# Patient Record
Sex: Male | Born: 1940 | Race: White | Hispanic: No | Marital: Married | Smoking: Former smoker
Health system: Southern US, Community
[De-identification: ages and names within clinical notes are randomized; demographics above are authoritative.]

## PROBLEM LIST (undated history)

## (undated) DIAGNOSIS — I10 Essential (primary) hypertension: Secondary | ICD-10-CM

## (undated) DIAGNOSIS — E669 Obesity, unspecified: Secondary | ICD-10-CM

## (undated) DIAGNOSIS — E785 Hyperlipidemia, unspecified: Secondary | ICD-10-CM

## (undated) DIAGNOSIS — E1140 Type 2 diabetes mellitus with diabetic neuropathy, unspecified: Secondary | ICD-10-CM

## (undated) DIAGNOSIS — Z889 Allergy status to unspecified drugs, medicaments and biological substances status: Secondary | ICD-10-CM

## (undated) DIAGNOSIS — I519 Heart disease, unspecified: Secondary | ICD-10-CM

## (undated) DIAGNOSIS — I739 Peripheral vascular disease, unspecified: Secondary | ICD-10-CM

## (undated) DIAGNOSIS — I482 Chronic atrial fibrillation, unspecified: Secondary | ICD-10-CM

## (undated) DIAGNOSIS — I5032 Chronic diastolic (congestive) heart failure: Secondary | ICD-10-CM

## (undated) DIAGNOSIS — I2510 Atherosclerotic heart disease of native coronary artery without angina pectoris: Secondary | ICD-10-CM

## (undated) DIAGNOSIS — IMO0002 Reserved for concepts with insufficient information to code with codable children: Secondary | ICD-10-CM

## (undated) DIAGNOSIS — J439 Emphysema, unspecified: Secondary | ICD-10-CM

## (undated) HISTORY — PX: CATARACT EXTRACTION: SUR2

## (undated) HISTORY — DX: Type 2 diabetes mellitus with diabetic neuropathy, unspecified: E11.40

## (undated) HISTORY — DX: Peripheral vascular disease, unspecified: I73.9

## (undated) HISTORY — DX: Chronic atrial fibrillation, unspecified: I48.20

## (undated) HISTORY — DX: Reserved for concepts with insufficient information to code with codable children: IMO0002

## (undated) HISTORY — DX: Hyperlipidemia, unspecified: E78.5

## (undated) HISTORY — PX: ROTATOR CUFF REPAIR: SHX139

## (undated) HISTORY — DX: Emphysema, unspecified: J43.9

## (undated) HISTORY — DX: Obesity, unspecified: E66.9

## (undated) HISTORY — PX: KNEE SURGERY: SHX244

## (undated) HISTORY — DX: Atherosclerotic heart disease of native coronary artery without angina pectoris: I25.10

## (undated) HISTORY — DX: Heart disease, unspecified: I51.9

## (undated) HISTORY — DX: Allergy status to unspecified drugs, medicaments and biological substances: Z88.9

## (undated) HISTORY — DX: Essential (primary) hypertension: I10

## (undated) HISTORY — DX: Chronic diastolic (congestive) heart failure: I50.32

---

## 2002-01-06 HISTORY — PX: CORONARY STENT PLACEMENT: SHX1402

## 2002-12-02 ENCOUNTER — Inpatient Hospital Stay (HOSPITAL_COMMUNITY): Admission: EM | Admit: 2002-12-02 | Discharge: 2002-12-04 | Payer: Self-pay | Admitting: Cardiology

## 2005-05-22 ENCOUNTER — Ambulatory Visit: Payer: Self-pay | Admitting: Cardiology

## 2005-05-26 ENCOUNTER — Ambulatory Visit: Payer: Self-pay | Admitting: Cardiology

## 2005-05-29 ENCOUNTER — Ambulatory Visit: Payer: Self-pay | Admitting: Cardiology

## 2005-06-06 ENCOUNTER — Ambulatory Visit: Payer: Self-pay | Admitting: Cardiology

## 2005-06-13 ENCOUNTER — Ambulatory Visit: Payer: Self-pay | Admitting: Cardiology

## 2005-06-20 ENCOUNTER — Ambulatory Visit: Payer: Self-pay | Admitting: Cardiology

## 2005-06-27 ENCOUNTER — Ambulatory Visit: Payer: Self-pay | Admitting: Cardiology

## 2005-07-25 ENCOUNTER — Ambulatory Visit: Payer: Self-pay | Admitting: Cardiology

## 2005-08-22 ENCOUNTER — Ambulatory Visit: Payer: Self-pay | Admitting: Cardiology

## 2005-09-11 ENCOUNTER — Ambulatory Visit: Payer: Self-pay | Admitting: Cardiology

## 2005-09-19 ENCOUNTER — Ambulatory Visit: Payer: Self-pay | Admitting: Cardiology

## 2005-09-25 ENCOUNTER — Ambulatory Visit: Payer: Self-pay | Admitting: Cardiology

## 2005-09-30 ENCOUNTER — Ambulatory Visit: Payer: Self-pay | Admitting: Cardiology

## 2005-10-10 ENCOUNTER — Ambulatory Visit: Payer: Self-pay | Admitting: Cardiology

## 2005-10-17 ENCOUNTER — Ambulatory Visit: Payer: Self-pay | Admitting: Cardiology

## 2005-11-14 ENCOUNTER — Ambulatory Visit: Payer: Self-pay | Admitting: Cardiology

## 2005-12-12 ENCOUNTER — Ambulatory Visit: Payer: Self-pay | Admitting: Cardiology

## 2006-01-09 ENCOUNTER — Ambulatory Visit: Payer: Self-pay | Admitting: Cardiology

## 2006-02-13 ENCOUNTER — Ambulatory Visit: Payer: Self-pay | Admitting: Cardiology

## 2006-02-27 ENCOUNTER — Ambulatory Visit: Payer: Self-pay | Admitting: Cardiology

## 2006-03-13 ENCOUNTER — Ambulatory Visit: Payer: Self-pay | Admitting: Cardiology

## 2006-04-10 ENCOUNTER — Ambulatory Visit: Payer: Self-pay | Admitting: Cardiology

## 2006-05-08 ENCOUNTER — Ambulatory Visit: Payer: Self-pay | Admitting: Cardiology

## 2006-05-25 ENCOUNTER — Ambulatory Visit: Payer: Self-pay | Admitting: Cardiology

## 2006-06-05 ENCOUNTER — Ambulatory Visit: Payer: Self-pay | Admitting: Cardiology

## 2006-06-18 ENCOUNTER — Ambulatory Visit: Payer: Self-pay | Admitting: Cardiology

## 2006-06-26 ENCOUNTER — Ambulatory Visit: Payer: Self-pay | Admitting: Cardiology

## 2006-07-03 ENCOUNTER — Ambulatory Visit: Payer: Self-pay | Admitting: Cardiology

## 2006-07-15 ENCOUNTER — Ambulatory Visit: Payer: Self-pay | Admitting: Critical Care Medicine

## 2006-07-21 ENCOUNTER — Ambulatory Visit: Payer: Self-pay | Admitting: Cardiology

## 2006-07-24 ENCOUNTER — Ambulatory Visit: Payer: Self-pay | Admitting: Cardiology

## 2006-07-28 ENCOUNTER — Ambulatory Visit: Payer: Self-pay | Admitting: Cardiology

## 2006-08-06 ENCOUNTER — Ambulatory Visit: Payer: Self-pay | Admitting: Physician Assistant

## 2006-08-24 ENCOUNTER — Ambulatory Visit: Payer: Self-pay | Admitting: Critical Care Medicine

## 2006-08-28 ENCOUNTER — Ambulatory Visit: Payer: Self-pay | Admitting: Cardiology

## 2006-09-18 ENCOUNTER — Ambulatory Visit: Payer: Self-pay | Admitting: Cardiology

## 2006-09-25 ENCOUNTER — Ambulatory Visit: Payer: Self-pay | Admitting: Cardiology

## 2006-10-09 ENCOUNTER — Ambulatory Visit: Payer: Self-pay | Admitting: Cardiology

## 2006-10-30 ENCOUNTER — Ambulatory Visit: Payer: Self-pay | Admitting: Cardiology

## 2006-11-27 ENCOUNTER — Ambulatory Visit: Payer: Self-pay | Admitting: Cardiology

## 2006-12-07 ENCOUNTER — Ambulatory Visit: Payer: Self-pay | Admitting: Critical Care Medicine

## 2007-01-04 ENCOUNTER — Ambulatory Visit: Payer: Self-pay | Admitting: Cardiology

## 2007-02-05 ENCOUNTER — Ambulatory Visit: Payer: Self-pay | Admitting: Cardiology

## 2007-03-05 ENCOUNTER — Ambulatory Visit: Payer: Self-pay | Admitting: Cardiology

## 2007-04-02 ENCOUNTER — Ambulatory Visit: Payer: Self-pay | Admitting: Cardiology

## 2007-04-30 ENCOUNTER — Ambulatory Visit: Payer: Self-pay | Admitting: Cardiology

## 2007-05-11 ENCOUNTER — Ambulatory Visit: Payer: Self-pay | Admitting: Cardiology

## 2007-05-28 ENCOUNTER — Ambulatory Visit: Payer: Self-pay | Admitting: Cardiology

## 2007-06-25 ENCOUNTER — Ambulatory Visit: Payer: Self-pay | Admitting: Cardiology

## 2007-07-06 ENCOUNTER — Ambulatory Visit: Payer: Self-pay | Admitting: Critical Care Medicine

## 2007-07-30 ENCOUNTER — Ambulatory Visit: Payer: Self-pay | Admitting: Cardiology

## 2007-09-03 ENCOUNTER — Ambulatory Visit: Payer: Self-pay | Admitting: Cardiology

## 2007-10-01 ENCOUNTER — Ambulatory Visit: Payer: Self-pay | Admitting: Cardiology

## 2007-10-28 ENCOUNTER — Ambulatory Visit: Payer: Self-pay | Admitting: Critical Care Medicine

## 2007-10-29 ENCOUNTER — Ambulatory Visit: Payer: Self-pay | Admitting: Cardiology

## 2007-11-01 ENCOUNTER — Encounter: Payer: Self-pay | Admitting: Critical Care Medicine

## 2007-11-08 ENCOUNTER — Telehealth (INDEPENDENT_AMBULATORY_CARE_PROVIDER_SITE_OTHER): Payer: Self-pay | Admitting: *Deleted

## 2007-11-26 ENCOUNTER — Ambulatory Visit: Payer: Self-pay | Admitting: Cardiology

## 2007-12-20 ENCOUNTER — Ambulatory Visit: Payer: Self-pay | Admitting: Critical Care Medicine

## 2007-12-24 ENCOUNTER — Ambulatory Visit: Payer: Self-pay | Admitting: Cardiology

## 2008-01-21 ENCOUNTER — Ambulatory Visit: Payer: Self-pay | Admitting: Cardiology

## 2008-02-01 ENCOUNTER — Ambulatory Visit: Payer: Self-pay | Admitting: Cardiology

## 2008-02-11 ENCOUNTER — Encounter: Payer: Self-pay | Admitting: Cardiology

## 2008-02-18 ENCOUNTER — Ambulatory Visit: Payer: Self-pay | Admitting: Cardiology

## 2008-02-25 ENCOUNTER — Ambulatory Visit: Payer: Self-pay | Admitting: Cardiology

## 2008-02-29 ENCOUNTER — Ambulatory Visit: Payer: Self-pay | Admitting: Cardiology

## 2008-03-10 ENCOUNTER — Ambulatory Visit: Payer: Self-pay | Admitting: Cardiology

## 2008-03-31 ENCOUNTER — Ambulatory Visit: Payer: Self-pay | Admitting: Cardiology

## 2008-04-28 ENCOUNTER — Ambulatory Visit: Payer: Self-pay | Admitting: Cardiology

## 2008-05-23 ENCOUNTER — Ambulatory Visit: Payer: Self-pay | Admitting: Cardiology

## 2008-06-08 ENCOUNTER — Ambulatory Visit: Payer: Self-pay | Admitting: Cardiology

## 2008-06-20 ENCOUNTER — Ambulatory Visit: Payer: Self-pay | Admitting: Cardiology

## 2008-07-07 ENCOUNTER — Ambulatory Visit: Payer: Self-pay | Admitting: Cardiology

## 2008-07-14 ENCOUNTER — Ambulatory Visit: Payer: Self-pay | Admitting: Cardiology

## 2008-08-11 ENCOUNTER — Ambulatory Visit: Payer: Self-pay | Admitting: Cardiology

## 2008-08-21 ENCOUNTER — Encounter: Payer: Self-pay | Admitting: *Deleted

## 2008-08-24 ENCOUNTER — Ambulatory Visit: Payer: Self-pay | Admitting: Critical Care Medicine

## 2008-09-05 ENCOUNTER — Ambulatory Visit: Payer: Self-pay | Admitting: Cardiology

## 2008-09-11 ENCOUNTER — Telehealth: Payer: Self-pay | Admitting: Emergency Medicine

## 2008-09-13 ENCOUNTER — Ambulatory Visit: Payer: Self-pay | Admitting: Critical Care Medicine

## 2008-09-20 ENCOUNTER — Ambulatory Visit: Payer: Self-pay | Admitting: Critical Care Medicine

## 2008-09-26 ENCOUNTER — Ambulatory Visit: Payer: Self-pay | Admitting: Cardiology

## 2008-10-17 ENCOUNTER — Ambulatory Visit: Payer: Self-pay | Admitting: Cardiology

## 2008-10-17 LAB — CONVERTED CEMR LAB: POC INR: 2.5

## 2008-11-13 ENCOUNTER — Encounter: Payer: Self-pay | Admitting: Physician Assistant

## 2008-11-13 ENCOUNTER — Ambulatory Visit: Payer: Self-pay | Admitting: Critical Care Medicine

## 2008-11-13 ENCOUNTER — Encounter: Payer: Self-pay | Admitting: Cardiology

## 2008-11-13 ENCOUNTER — Ambulatory Visit: Payer: Self-pay | Admitting: Cardiology

## 2008-11-13 LAB — CONVERTED CEMR LAB: POC INR: 2.4

## 2008-11-14 ENCOUNTER — Encounter: Payer: Self-pay | Admitting: Cardiology

## 2008-11-17 ENCOUNTER — Encounter: Payer: Self-pay | Admitting: Critical Care Medicine

## 2008-11-20 ENCOUNTER — Encounter: Payer: Self-pay | Admitting: Physician Assistant

## 2008-12-01 ENCOUNTER — Encounter: Payer: Self-pay | Admitting: Critical Care Medicine

## 2008-12-07 ENCOUNTER — Ambulatory Visit: Payer: Self-pay | Admitting: Cardiology

## 2008-12-08 ENCOUNTER — Encounter: Payer: Self-pay | Admitting: Cardiology

## 2008-12-13 ENCOUNTER — Ambulatory Visit: Payer: Self-pay | Admitting: Critical Care Medicine

## 2009-01-02 ENCOUNTER — Ambulatory Visit: Payer: Self-pay | Admitting: Cardiology

## 2009-01-02 LAB — CONVERTED CEMR LAB: POC INR: 2.1

## 2009-01-30 ENCOUNTER — Ambulatory Visit: Payer: Self-pay | Admitting: Cardiology

## 2009-01-30 LAB — CONVERTED CEMR LAB: POC INR: 2.2

## 2009-02-08 ENCOUNTER — Ambulatory Visit: Payer: Self-pay | Admitting: Cardiology

## 2009-02-27 ENCOUNTER — Ambulatory Visit: Payer: Self-pay | Admitting: Cardiology

## 2009-02-27 LAB — CONVERTED CEMR LAB: POC INR: 2.2

## 2009-03-14 ENCOUNTER — Ambulatory Visit: Payer: Self-pay | Admitting: Critical Care Medicine

## 2009-03-27 ENCOUNTER — Ambulatory Visit: Payer: Self-pay | Admitting: Cardiology

## 2009-04-24 ENCOUNTER — Ambulatory Visit: Payer: Self-pay | Admitting: Cardiology

## 2009-05-25 ENCOUNTER — Ambulatory Visit: Payer: Self-pay | Admitting: Cardiology

## 2009-05-25 LAB — CONVERTED CEMR LAB: POC INR: 2.1

## 2009-05-28 ENCOUNTER — Encounter: Payer: Self-pay | Admitting: Cardiology

## 2009-06-22 ENCOUNTER — Ambulatory Visit: Payer: Self-pay | Admitting: Cardiology

## 2009-06-27 ENCOUNTER — Encounter: Payer: Self-pay | Admitting: Cardiology

## 2009-07-18 ENCOUNTER — Ambulatory Visit: Payer: Self-pay | Admitting: Critical Care Medicine

## 2009-07-25 ENCOUNTER — Encounter: Payer: Self-pay | Admitting: Cardiology

## 2009-07-27 ENCOUNTER — Ambulatory Visit: Payer: Self-pay | Admitting: Cardiology

## 2009-08-24 ENCOUNTER — Ambulatory Visit: Payer: Self-pay | Admitting: Cardiology

## 2009-08-24 LAB — CONVERTED CEMR LAB: POC INR: 2.4

## 2009-09-04 ENCOUNTER — Encounter: Payer: Self-pay | Admitting: Cardiology

## 2009-09-07 ENCOUNTER — Ambulatory Visit: Payer: Self-pay | Admitting: Critical Care Medicine

## 2009-09-13 ENCOUNTER — Ambulatory Visit: Payer: Self-pay | Admitting: Cardiology

## 2009-09-18 ENCOUNTER — Encounter: Payer: Self-pay | Admitting: Critical Care Medicine

## 2009-09-21 ENCOUNTER — Ambulatory Visit: Payer: Self-pay | Admitting: Cardiology

## 2009-09-21 LAB — CONVERTED CEMR LAB: POC INR: 2

## 2009-10-15 ENCOUNTER — Telehealth (INDEPENDENT_AMBULATORY_CARE_PROVIDER_SITE_OTHER): Payer: Self-pay | Admitting: *Deleted

## 2009-10-19 ENCOUNTER — Ambulatory Visit: Payer: Self-pay | Admitting: Cardiology

## 2009-10-19 LAB — CONVERTED CEMR LAB: POC INR: 2.1

## 2009-11-15 ENCOUNTER — Encounter: Payer: Self-pay | Admitting: Critical Care Medicine

## 2009-11-16 ENCOUNTER — Ambulatory Visit: Payer: Self-pay | Admitting: Cardiology

## 2009-11-20 ENCOUNTER — Ambulatory Visit: Payer: Self-pay | Admitting: Critical Care Medicine

## 2009-11-21 ENCOUNTER — Encounter: Payer: Self-pay | Admitting: Critical Care Medicine

## 2009-12-14 ENCOUNTER — Ambulatory Visit: Payer: Self-pay | Admitting: Cardiology

## 2009-12-14 LAB — CONVERTED CEMR LAB: POC INR: 2.3

## 2010-01-02 ENCOUNTER — Ambulatory Visit: Payer: Self-pay | Admitting: Critical Care Medicine

## 2010-01-11 ENCOUNTER — Ambulatory Visit: Admission: RE | Admit: 2010-01-11 | Discharge: 2010-01-11 | Payer: Self-pay | Source: Home / Self Care

## 2010-01-11 LAB — CONVERTED CEMR LAB: POC INR: 2

## 2010-02-03 LAB — CONVERTED CEMR LAB: POC INR: 2.5

## 2010-02-05 NOTE — Miscellaneous (Signed)
  Clinical Lists Changes  Observations: Added new observation of PAST MED HX:  dyspnea..chronic... Dr. Danise Mina....COPD and primary emphysema. / O2 at nighttime started November, 2010 Atrial fibrillation...permanent Coumadin therapy. EF 60%... echo... June, 2010.... technically limited... mild LVH.. Right ventricle.... mild moderately dilated... echo... June, 2010.... mild RV dysfunction... RV pressure could not be estimated with no TR CAD.Marland KitchenMarland KitchenMI with a Taxus stent in 2004..  /   nuclear... June, 2010.. small scar or slight ischemia inferior wall Diabetes treated. Hyperlipidemia  Obesity.   peripheral edema..mild Hypertension (02/08/2009 7:46) Added new observation of REFERRING MD: Delford Field (02/08/2009 7:46) Added new observation of PRIMARY MD: Arlina Robes, PA @ [facility] (02/08/2009 7:46)       Past History:  Past Medical History:  dyspnea..chronic... Dr. Danise Mina....COPD and primary emphysema. / O2 at nighttime started November, 2010 Atrial fibrillation...permanent Coumadin therapy. EF 60%... echo... June, 2010.... technically limited... mild LVH.. Right ventricle.... mild moderately dilated... echo... June, 2010.... mild RV dysfunction... RV pressure could not be estimated with no TR CAD.Marland KitchenMarland KitchenMI with a Taxus stent in 2004..  /   nuclear... June, 2010.. small scar or slight ischemia inferior wall Diabetes treated. Hyperlipidemia  Obesity.   peripheral edema..mild Hypertension

## 2010-02-05 NOTE — Miscellaneous (Signed)
Summary: Rehab Report/ CARDIAC REHAB PROGRESS REPORT  Rehab Report/ CARDIAC REHAB PROGRESS REPORT   Imported By: Dorise Hiss 06/27/2009 14:36:59  _____________________________________________________________________  External Attachment:    Type:   Image     Comment:   External Document

## 2010-02-05 NOTE — Medication Information (Signed)
Summary: ccr-lr  Anticoagulant Therapy  Managed by: Vashti Hey, RN PCP: Dr. Quintin Alto Supervising MD: Andee Lineman MD, Michelle Piper Indication 1: Atrial Fibrillation (ICD-427.31) [name] [place] Site: [place] INR POC 2.2  Dietary changes: no    Health status changes: yes       Details: In grown toenail  Bleeding/hemorrhagic complications: no    Recent/future hospitalizations: no    Any changes in medication regimen? yes       Details: On Augmentin x 10 days   Finished this morning  Recent/future dental: no  Any missed doses?: no       Is patient compliant with meds? yes       Allergies: 1)  ! Plavix (Clopidogrel Bisulfate)  Anticoagulation Management History:      Positive risk factors for bleeding include an age of 72 years or older.  The bleeding index is 'intermediate risk'.  Positive CHADS2 values include History of CHF and History of HTN.  Negative CHADS2 values include Age > 52 years old.  The start date was 05/26/2005.  Anticoagulation responsible provider: Andee Lineman MD, Michelle Piper.  INR POC: 2.2.  Exp: 10/11.    Anticoagulation Management Assessment/Plan:      The patient's current anticoagulation dose is Warfarin sodium 5 mg tabs: take as directed per coumadin clinic.  The target INR is 2 - 3.  The next INR is due 12/14/2009.  Anticoagulation instructions were given to patient.  Results were reviewed/authorized by Vashti Hey, RN.  He was notified by Vashti Hey RN.         Prior Anticoagulation Instructions: INR 2.1 Continue coumadin 5mg  once daily   Current Anticoagulation Instructions: INR 2.2 Continue coumadin 5mg  once daily

## 2010-02-05 NOTE — Medication Information (Signed)
Summary: ccr-lr  Anticoagulant Therapy  Managed by: Vashti Hey, RN PCP: Dr. Quintin Alto Supervising MD: Antoine Poche MD, Fayrene Fearing Indication 1: Atrial Fibrillation (ICD-427.31) Lab [name] [place] Site: [place] INR POC 2.0  Dietary changes: no    Health status changes: no    Bleeding/hemorrhagic complications: no    Recent/future hospitalizations: no    Any changes in medication regimen? no    Recent/future dental: no  Any missed doses?: no       Is patient compliant with meds? yes       Allergies: 1)  ! Plavix (Clopidogrel Bisulfate)  Anticoagulation Management History:      The patient is taking warfarin and comes in today for a routine follow up visit.  Positive risk factors for bleeding include an age of 72 years or older.  The bleeding index is 'intermediate risk'.  Positive CHADS2 values include History of CHF and History of HTN.  Negative CHADS2 values include Age > 40 years old.  The start date was 05/26/2005.  Anticoagulation responsible provider: Antoine Poche MD, Fayrene Fearing.  INR POC: 2.0.  Cuvette Lot#: [id number].  Exp: 10/11.    Anticoagulation Management Assessment/Plan:      The patient's current anticoagulation dose is Warfarin sodium 5 mg tabs: take as directed per coumadin clinic.  The target INR is 2 - 3.  The next INR is due 05/25/2009.  Anticoagulation instructions were given to patient.  Results were reviewed/authorized by Vashti Hey, RN.  He was notified by Vashti Hey RN.         Prior Anticoagulation Instructions: INR 2.1 Continue coumadin 5mg  once daily except 2.5mg  on Mondays  Current Anticoagulation Instructions: INR 2.0 Increase coumadin to 5mg  once daily

## 2010-02-05 NOTE — Assessment & Plan Note (Signed)
Summary: 2 MO FU -SRS***[name] ONLY***   Visit Type:  Follow-up Referring Provider:  Delford Field Primary Provider:  Dr. Quintin Alto  CC:  atrial fibrillation.  History of Present Illness: Patient is seen for followup of atrial fibrillation.  He has significant COPD and this followed by pulmonary for this.  He does have coronary disease.  We have assessed him carefully.  I feel that his current shortness of breath is mostly pulmonary.  In general he's doing well under the care of Dr.Wright. He does have some peripheral edema.  He is on diuretics.  I talked to him today about elevating his feet when he is at home resting.  Preventive Screening-Counseling & Management  Alcohol-Tobacco     Smoking Status: quit > 6 months  Comments: Pt smoked for about 40 yrs and quit about 10 yrs ago  Current Medications (verified): 1)  Adult Aspirin Low Strength 81 Mg  Tbdp (Aspirin) .... One By Mouth Once Daily 2)  Lovastatin 40 Mg  Tabs (Lovastatin) .... One By Mouth Once Daily 3)  Metoprolol Succinate 50 Mg  Tb24 (Metoprolol Succinate) .... One By Mouth Two Times A Day 4)  Warfarin Sodium 5 Mg Tabs (Warfarin Sodium) .... Take As Directed Per Coumadin Clinic 5)  Metformin Hcl 500 Mg  Tb24 (Metformin Hcl) .... Take 1 Tablet By Mouth Once A Day 6)  Klor-Con M20 20 Meq  Tbcr (Potassium Chloride Crys Cr) .... Take One Tablet By Mouth Daily 7)  Furosemide 80 Mg Tabs (Furosemide) .... Take 1  Tablet By Mouth Two Times A Day 8)  Centrum  Tabs (Multiple Vitamins-Minerals) .... Once Daily 9)  Fish Oil 1000 Mg Caps (Omega-3 Fatty Acids) .Marland Kitchen.. 1 Cap Daily 10)  Vitamin C 500 Mg Tabs (Ascorbic Acid) .... Once Daily 11)  Gabapentin 300 Mg Caps (Gabapentin) .... 4 Tabs in Am and 4 Tablets in Pm 12)  Symbicort 160-4.5 Mcg/act  Aero (Budesonide-Formoterol Fumarate) .... Two Puffs Twice Daily 13)  Spiriva Handihaler 18 Mcg Caps (Tiotropium Bromide Monohydrate) .... Once Daily 14)  Prednisone 10 Mg  Tabs (Prednisone) ....  Take One-Half  By Mouth Daily 15)  Oxygen .... 2l At Bedtime 16)  Losartan Potassium 100 Mg Tabs (Losartan Potassium) .... Take 1 Tablet By Mouth Once A Day  Allergies: 1)  ! Plavix (Clopidogrel Bisulfate)  Comments:  Nurse/Medical Assistant: The patient's medications were reviewed with the patient and were updated in the Medication List. Pt brought a list of medications to office visit.  Cyril Loosen, RN, BSN (February 08, 2009 1:19 PM)  Past History:  Past Medical History: Last updated: 02/08/2009  dyspnea..chronic... Dr. Danise Mina....COPD and primary emphysema. / O2 at nighttime started November, 2010 Atrial fibrillation...permanent Coumadin therapy. EF 60%... echo... June, 2010.... technically limited... mild LVH.. Right ventricle.... mild moderately dilated... echo... June, 2010.... mild RV dysfunction... RV pressure could not be estimated with no TR CAD.Marland KitchenMarland KitchenMI with a Taxus stent in 2004..  /   nuclear... June, 2010.. small scar or slight ischemia inferior wall Diabetes treated. Hyperlipidemia  Obesity.   peripheral edema..mild Hypertension  Review of Systems       Patient denies fever, chills, headache, sweats, rash, change in vision, change in hearing, chest pain.  He does have cough and he does have shortness of breath.  There is no nausea or vomiting.  He has no urinary symptoms.  All other systems are reviewed and are negative.  Vital Signs:  Patient profile:   70 year old male Height:  70 inches Weight:      264.75 pounds BMI:     38.12 O2 Sat:      96 % on Room air Pulse rate:   89 / minute BP sitting:   130 / 92  (left arm) Cuff size:   large  Vitals Entered By: Cyril Loosen, RN, BSN (February 08, 2009 1:13 PM)  Nutrition Counseling: Patient's BMI is greater than 25 and therefore counseled on weight management options.  O2 Flow:  Room air CC: atrial fibrillation Comments Follow up visit.   Physical Exam  General:  patient is overweight but  stable. Eyes:  no xanthelasma. Neck:  no jugular venous distention. Lungs:  lungs sounds are distant. Heart:  Heart sounds are distant. Abdomen:  abdomen is obese but soft Extremities:  there is trace peripheral edema. Psych:  patient is oriented to person time and place.  Affect is normal.   Impression & Recommendations:  Problem # 1:  ACUTE ON CHRONIC DIASTOLIC HEART FAILURE (ICD-428.33) The patient's volume status is stable.  I talked with him today about elevating his feet if he is sitting at home.  Of course I encouraged him to be up and around and active.  He is on the appropriate medicines.  No change in medicines are needed.  Problem # 2:  CAD (ICD-414.00) Coronary disease is stable.  No further workup is needed.  Problem # 3:  ATRIAL FIBRILLATION (ICD-427.31) Atrial fibrillation controlled.  No further workup is needed.  The past medical history is very carefully updated and list the patient's other issues.  These are all reviewed and no changes are needed.  Appended Document:  Cardiology     Allergies: 1)  ! Plavix (Clopidogrel Bisulfate)   Patient Instructions: 1)  Your physician recommends that you continue on your current medications as directed. Please refer to the Current Medication list given to you today. 2)  Your physician wants you to follow-up in: 6months. You will receive a reminder letter in the mail about two months in advance. If you don't receive a letter, please call our office to schedule the follow-up appointment.

## 2010-02-05 NOTE — Medication Information (Signed)
Summary: ccr-lr  Anticoagulant Therapy  Managed by: Vashti Hey, RN PCP: Dr. Quintin Alto Supervising MD: Andee Lineman MD, Michelle Piper Indication 1: Atrial Fibrillation (ICD-427.31) [name] [place] Site: [place] INR POC 2.1  Dietary changes: no    Health status changes: no    Bleeding/hemorrhagic complications: no    Recent/future hospitalizations: no    Any changes in medication regimen? no    Recent/future dental: no  Any missed doses?: no       Is patient compliant with meds? yes       Allergies: 1)  ! Plavix (Clopidogrel Bisulfate)  Anticoagulation Management History:      The patient is taking warfarin and comes in today for a routine follow up visit.  Positive risk factors for bleeding include an age of 72 years or older.  The bleeding index is 'intermediate risk'.  Positive CHADS2 values include History of CHF and History of HTN.  Negative CHADS2 values include Age > 17 years old.  The start date was 05/26/2005.  Anticoagulation responsible provider: Andee Lineman MD, Michelle Piper.  INR POC: 2.1.  Cuvette Lot#: [id number].  Exp: 10/11.    Anticoagulation Management Assessment/Plan:      The patient's current anticoagulation dose is Warfarin sodium 5 mg tabs: take as directed per coumadin clinic.  The target INR is 2 - 3.  The next INR is due 06/22/2009.  Anticoagulation instructions were given to patient.  Results were reviewed/authorized by Vashti Hey, RN.  He was notified by Vashti Hey RN.         Prior Anticoagulation Instructions: INR 2.0 Increase coumadin to 5mg  once daily   Current Anticoagulation Instructions: INR 2.1 Continue coumadin 5mg  once daily

## 2010-02-05 NOTE — Miscellaneous (Signed)
Summary: Rehab Report/ CARDIAC REHAB PROGRESS REPORT  Rehab Report/ CARDIAC REHAB PROGRESS REPORT   Imported By: Dorise Hiss 05/28/2009 16:58:22  _____________________________________________________________________  External Attachment:    Type:   Image     Comment:   External Document

## 2010-02-05 NOTE — Medication Information (Signed)
Summary: ccr-lr  Anticoagulant Therapy  Managed by: Vashti Hey, RN PCP: Dr. Quintin Alto Supervising MD: Andee Lineman MD, Michelle Piper Indication 1: Atrial Fibrillation (ICD-427.31) Lab [name] [place] Site: [place] INR POC 2.7  Dietary changes: no    Health status changes: no    Bleeding/hemorrhagic complications: no    Recent/future hospitalizations: no    Any changes in medication regimen? yes       Details: was on prednisone dose pack and antibiotic last week for breathing  Recent/future dental: no  Any missed doses?: no       Is patient compliant with meds? yes       Allergies: 1)  ! Plavix (Clopidogrel Bisulfate)  Anticoagulation Management History:      The patient is taking warfarin and comes in today for a routine follow up visit.  Positive risk factors for bleeding include an age of 72 years or older.  The bleeding index is 'intermediate risk'.  Positive CHADS2 values include History of CHF and History of HTN.  Negative CHADS2 values include Age > 60 years old.  The start date was 05/26/2005.  Anticoagulation responsible provider: Andee Lineman MD, Michelle Piper.  INR POC: 2.7.  Cuvette Lot#: [id number].  Exp: 10/11.    Anticoagulation Management Assessment/Plan:      The patient's current anticoagulation dose is Warfarin sodium 5 mg tabs: take as directed per coumadin clinic.  The target INR is 2 - 3.  The next INR is due 08/24/2009.  Anticoagulation instructions were given to patient.  Results were reviewed/authorized by Vashti Hey, RN.  He was notified by Vashti Hey RN.         Prior Anticoagulation Instructions: INR 2.5 Continue coumadin 5mg  once daily   Current Anticoagulation Instructions: INR 2.7 Continue coumadin 5mg  once daily

## 2010-02-05 NOTE — Assessment & Plan Note (Signed)
Summary: 6 mo fu per aug reminder-srs   Visit Type:  Follow-up Referring Provider:  Delford Field Primary Provider:  Dr. Quintin Alto  CC:  shortness of breath  /  fluid overload.  History of Present Illness: The patient is seen today for ongoing shortness of breath and atrial fibrillation.  He has severe lung disease and is followed by Dr. Delford Field.  He ran out of his fluid medicine 2 days ago and is mildly volume overloaded.  He does have some edema today.  Recently his blood pressure had been on the low side and his other physicians have cut back his dosing and now he is off losartan completely.  He has good LV function and I am in agreement with this.  Preventive Screening-Counseling & Management  Alcohol-Tobacco     Smoking Status: quit     Year Quit: 10 yrs ago  Current Medications (verified): 1)  Adult Aspirin Low Strength 81 Mg  Tbdp (Aspirin) .... One By Mouth Once Daily 2)  Lovastatin 40 Mg  Tabs (Lovastatin) .... One By Mouth Once Daily 3)  Metoprolol Succinate 50 Mg  Tb24 (Metoprolol Succinate) .... 1/2 By Mouth Two Times A Day 4)  Warfarin Sodium 5 Mg Tabs (Warfarin Sodium) .... Take As Directed Per Coumadin Clinic 5)  Metformin Hcl 500 Mg  Tb24 (Metformin Hcl) .... Take 1 Tablet By Mouth Once A Day 6)  Klor-Con M20 20 Meq  Tbcr (Potassium Chloride Crys Cr) .... Take One Tablet By Mouth Daily 7)  Furosemide 80 Mg Tabs (Furosemide) .... Take 1  Tablet By Mouth Two Times A Day 8)  Centrum  Tabs (Multiple Vitamins-Minerals) .... Once Daily 9)  Fish Oil 1000 Mg Caps (Omega-3 Fatty Acids) .Marland Kitchen.. 1 Cap Daily 10)  Vitamin C 500 Mg Tabs (Ascorbic Acid) .... Once Daily 11)  Gabapentin 300 Mg Caps (Gabapentin) .... 4 Tabs in Am and 4 Tablets in Pm 12)  Symbicort 160-4.5 Mcg/act  Aero (Budesonide-Formoterol Fumarate) .... Two Puffs Twice Daily 13)  Spiriva Handihaler 18 Mcg Caps (Tiotropium Bromide Monohydrate) .... Once Daily 14)  Prednisone 10 Mg  Tabs (Prednisone) .... 1/2 One Daily 15)   Oxygen .... 2l At Bedtime  Allergies: 1)  ! Plavix (Clopidogrel Bisulfate)  Past History:  Past Medical History:  dyspnea..chronic... Dr. Danise Mina....COPD and primary emphysema. / O2 at nighttime started November, 2010 Atrial fibrillation...permanent Coumadin therapy. EF 60%... echo... June, 2010.... technically limited... mild LVH.. Right ventricle.... mild moderately dilated... echo... June, 2010.... mild RV dysfunction... RV pressure could not be estimated with no TR CAD.Marland KitchenMarland KitchenMI with a Taxus stent in 2004..  /   nuclear... June, 2010.. small scar or slight ischemia inferior wall Diabetes treated. Hyperlipidemia  Obesity.   peripheral edema..mild Hypertension  ... relative hypotension... August, 2011... Cozaar stopped  Social History: Smoking Status:  quit  Review of Systems       Patient denies fever, chills, headache, sweats, rash, , change in vision, change in hearing, chest pain, nausea vomiting, urinary symptoms.  All other systems are reviewed and are negative.  Vital Signs:  Patient profile:   70 year old male Height:      70 inches Weight:      267.50 pounds O2 Sat:      94 % on Room air Pulse rate:   86 / minute BP sitting:   136 / 82  (left arm) Cuff size:   regular  Vitals Entered By: Hoover Brunette, LPN (September 13, 2009 1:33 PM)  O2  Flow:  Room air CC: shortness of breath  /  fluid overload Is Patient Diabetic? Yes Comments 6 month f/u.  Did not wear oxygen last night due to doing test to re-qualify for his oxygen - ordered by Dr. Delford Field.  Breathing not so great today per pt because of this.   Physical Exam  General:  patient is stable.  He is overweight. Eyes:  no xanthelasma. Neck:  no jugular venous extension. Lungs:  lungs reveal scattered rhonchi. Heart:  cardiac exam reveals S1-S2.  Sounds are distant. Abdomen:  abdomen is obese. Extremities:  1+ peripheral edema. Psych:  patient is oriented to person time and place.  Affect is  normal.   Impression & Recommendations:  Problem # 1:  ACUTE ON CHRONIC DIASTOLIC HEART FAILURE (ICD-428.33)  His updated medication list for this problem includes:    Adult Aspirin Low Strength 81 Mg Tbdp (Aspirin) ..... One by mouth once daily    Metoprolol Succinate 50 Mg Tb24 (Metoprolol succinate) .Marland Kitchen... 1/2 by mouth two times a day    Warfarin Sodium 5 Mg Tabs (Warfarin sodium) .Marland Kitchen... Take as directed per coumadin clinic    Furosemide 80 Mg Tabs (Furosemide) .Marland Kitchen... Take 1  tablet by mouth two times a day The patient is mildly fluid overloaded today as he ran out of his medicines.  In general I do not think he's wet when he is taking his meds.  He has his Lasix refilled and will take it as soon as he gets home today  Problem # 2:  ATRIAL FIBRILLATION (ICD-427.31)  His updated medication list for this problem includes:    Adult Aspirin Low Strength 81 Mg Tbdp (Aspirin) ..... One by mouth once daily    Metoprolol Succinate 50 Mg Tb24 (Metoprolol succinate) .Marland Kitchen... 1/2 by mouth two times a day    Warfarin Sodium 5 Mg Tabs (Warfarin sodium) .Marland Kitchen... Take as directed per coumadin clinic Atrial fibrillation controlled.  No change in therapy.  Problem # 3:  HYPERTENSION (ICD-401.9)  His updated medication list for this problem includes:    Adult Aspirin Low Strength 81 Mg Tbdp (Aspirin) ..... One by mouth once daily    Metoprolol Succinate 50 Mg Tb24 (Metoprolol succinate) .Marland Kitchen... 1/2 by mouth two times a day    Furosemide 80 Mg Tabs (Furosemide) .Marland Kitchen... Take 1  tablet by mouth two times a day Blood pressure has been low recently.  His Cozaar was stopped.  Systolic pressure today is 136.  As he gets back on his diuretic I suspect his pressure will be lower.  I will not resume his Cozaar at this time.  Patient Instructions: 1)  Your physician wants you to follow-up in: 6 months. You will receive a reminder letter in the mail one-two months in advance. If you don't receive a letter, please call our  office to schedule the follow-up appointment. 2)  Your physician recommends that you continue on your current medications as directed. Please refer to the Current Medication list given to you today.

## 2010-02-05 NOTE — Miscellaneous (Signed)
Summary: ONO on RA  Clinical Lists Changes  Observations: Added new observation of SLEEP STUDY: Low Oxygen Sat:   64%  Oximetry overnight on       RA           =   < 89%  (09/12/2009 11:53)      Sleep Study  Procedure date:  09/12/2009  Findings:      Low Oxygen Sat:   64%  Oximetry overnight on       RA           =   < 89%

## 2010-02-05 NOTE — Medication Information (Signed)
Summary: ccr-lr  Anticoagulant Therapy  Managed by: Vashti Hey, RN PCP: Dr. Quintin Alto Supervising MD: Andee Lineman MD, Michelle Piper Indication 1: Atrial Fibrillation (ICD-427.31) Lab [name] [place] Site: [place] INR POC 2.2  Dietary changes: no    Health status changes: no    Bleeding/hemorrhagic complications: no    Recent/future hospitalizations: no    Any changes in medication regimen? no    Recent/future dental: no  Any missed doses?: no       Is patient compliant with meds? yes       Allergies: 1)  ! Plavix (Clopidogrel Bisulfate)  Anticoagulation Management History:      The patient is taking warfarin and comes in today for a routine follow up visit.  Positive risk factors for bleeding include an age of 72 years or older.  The bleeding index is 'intermediate risk'.  Positive CHADS2 values include History of CHF and History of HTN.  Negative CHADS2 values include Age > 72 years old.  The start date was 05/26/2005.  Anticoagulation responsible provider: Andee Lineman MD, Michelle Piper.  INR POC: 2.2.  Cuvette Lot#: [id number].  Exp: 10/11.    Anticoagulation Management Assessment/Plan:      The patient's current anticoagulation dose is Warfarin sodium 5 mg tabs: take as directed per coumadin clinic.  The target INR is 2 - 3.  The next INR is due 03/27/2009.  Anticoagulation instructions were given to patient.  Results were reviewed/authorized by Vashti Hey, RN.  He was notified by Vashti Hey RN.         Prior Anticoagulation Instructions: INR 2.2 Continue coumadin 5mg  once daily except 2.5mg  on Mondays  Current Anticoagulation Instructions: Same as Prior Instructions.

## 2010-02-05 NOTE — Medication Information (Signed)
Summary: ccr-lr  Anticoagulant Therapy  Managed by: Vashti Hey, RN PCP: Dr. Quintin Alto Supervising MD: Andee Lineman MD, Michelle Piper Indication 1: Atrial Fibrillation (ICD-427.31) Lab [name] Site: [place] INR POC 2.0  Dietary changes: no    Health status changes: no    Bleeding/hemorrhagic complications: no    Recent/future hospitalizations: no    Any changes in medication regimen? no    Recent/future dental: no  Any missed doses?: no       Is patient compliant with meds? yes       Allergies: 1)  ! Plavix (Clopidogrel Bisulfate)  Anticoagulation Management History:      The patient is taking warfarin and comes in today for a routine follow up visit.  Positive risk factors for bleeding include an age of 72 years or older.  The bleeding index is 'intermediate risk'.  Positive CHADS2 values include History of CHF and History of HTN.  Negative CHADS2 values include Age > 27 years old.  The start date was 05/26/2005.  Anticoagulation responsible provider: Andee Lineman MD, Michelle Piper.  INR POC: 2.0.  Cuvette Lot#: [id number].  Exp: 10/11.    Anticoagulation Management Assessment/Plan:      The patient's current anticoagulation dose is Warfarin sodium 5 mg tabs: take as directed per coumadin clinic.  The target INR is 2 - 3.  The next INR is due 10/19/2009.  Anticoagulation instructions were given to patient.  Results were reviewed/authorized by Vashti Hey, RN.  He was notified by Vashti Hey RN.         Prior Anticoagulation Instructions: INR 2.4 Continue coumadin 5mg  once daily   Current Anticoagulation Instructions: INR 2.0 Take coumadin 7.5mg  tonight then resume 5mg  once daily

## 2010-02-05 NOTE — Progress Notes (Signed)
Summary: recert O2 / rov date  Phone Note Call from Patient Call back at Home Phone [phone]   Caller: Patient Call For: WRIGHT Summary of Call: pt was seen in sept- is to return for rov in dec. however, pt called and wants to know if he should be seen soon for O2 f/u as he has been told by somone at [facility] that it's time to re-certify for this. please advise when pt needs to have rov.  Initial call taken by: Tivis Ringer, CNA,  October 15, 2009 8:57 AM  Follow-up for Phone Call        Pt advised he received notification from [facility] stating he needs to recert on O2. I called and spoke with Janelle with [facility] who advised for Medicare pt's on O2 at night only need to have documentation in OV stating "pt is on oxygen therapy and is benefiting from oxygen" within the start date of O2 treatment (11-20-2008). Pt last seen 09/07/2009 and was told to return in 4 months per PW. Pt is scheduled 01/02/2010 @ 9:30am. LMOMTCB x1 to reschedule pt to a November appointment. Zackery Barefoot CMA  October 15, 2009 11:15 AM   Additional Follow-up for Phone Call Additional follow up Details #1::        pt returned call. call his home #. Tivis Ringer, CNA  October 15, 2009 1:01 Patients appt has been moved up to 11/20/2009 so that PW office note can state pt is wearing oxygen and benefiting from it. Pt is aware of date and time of appt. Additional Follow-up by: Michel Bickers CMA,  October 15, 2009 3:44 PM

## 2010-02-05 NOTE — Medication Information (Signed)
Summary: ccr-lr  Anticoagulant Therapy  Managed by: Vashti Hey, RN PCP: Dr. Quintin Alto Supervising MD: Andee Lineman MD, Michelle Piper Indication 1: Atrial Fibrillation (ICD-427.31) Lab [name] Site: [facility] INR POC 2.4  Dietary changes: no    Health status changes: no    Bleeding/hemorrhagic complications: no    Recent/future hospitalizations: no    Any changes in medication regimen? no    Recent/future dental: no  Any missed doses?: no       Is patient compliant with meds? yes       Allergies: 1)  ! Plavix (Clopidogrel Bisulfate)  Anticoagulation Management History:      The patient is taking warfarin and comes in today for a routine follow up visit.  Positive risk factors for bleeding include an age of 72 years or older.  The bleeding index is 'intermediate risk'.  Positive CHADS2 values include History of CHF and History of HTN.  Negative CHADS2 values include Age > 85 years old.  The start date was 05/26/2005.  Anticoagulation responsible [name]: Andee Lineman MD, Michelle Piper.  INR POC: 2.4.  Cuvette Lot#: [id number].  Exp: 10/11.    Anticoagulation Management Assessment/Plan:      The patient's current anticoagulation dose is Warfarin sodium 5 mg tabs: take as directed per coumadin clinic.  The target INR is 2 - 3.  The next INR is due 09/21/2009.  Anticoagulation instructions were given to patient.  Results were reviewed/authorized by Vashti Hey, RN.  He was notified by Vashti Hey RN.         Prior Anticoagulation Instructions: INR 2.7 Continue coumadin 5mg  once daily   Current Anticoagulation Instructions: INR 2.4 Continue coumadin 5mg  once daily

## 2010-02-05 NOTE — Letter (Signed)
Summary: CMN/[facility]  CMN/[facility]   Imported By: Lester [place] 11/23/2009 10:30:09  _____________________________________________________________________  External Attachment:    Type:   Image     Comment:   External Document

## 2010-02-05 NOTE — Procedures (Signed)
Summary: Oximetry/[facility]  Oximetry/[facility]   Imported By: Lester [place] 11/23/2009 10:32:08  _____________________________________________________________________  External Attachment:    Type:   Image     Comment:   External Document

## 2010-02-05 NOTE — Medication Information (Signed)
Summary: ccr-lr  Anticoagulant Therapy  Managed by: Peter Hey, RN PCP: Dr. Quintin Alto Supervising MD: Andee Lineman MD, Michelle Piper Indication 1: Atrial Fibrillation (ICD-427.31) Lab [facility]: [name] [place] Site: [place] INR POC 2.1  Dietary changes: no    Health status changes: no    Bleeding/hemorrhagic complications: no    Recent/future hospitalizations: no    Any changes in medication regimen? no    Recent/future dental: no  Any missed doses?: no       Is patient compliant with meds? yes       Allergies: 1)  ! Plavix (Clopidogrel Bisulfate)  Anticoagulation Management History:      The patient is taking warfarin and comes in today for a routine follow up visit.  Positive risk factors for bleeding include an age of 72 years or older.  The bleeding index is 'intermediate risk'.  Positive CHADS2 values include History of CHF and History of HTN.  Negative CHADS2 values include Age > 72 years old.  The start date was 05/26/2005.  Anticoagulation responsible provider: Andee Lineman MD, Michelle Piper.  INR POC: 2.1.  Cuvette Lot#: [id number].  Exp: 10/11.    Anticoagulation Management Assessment/Plan:      The patient's current anticoagulation dose is Warfarin sodium 5 mg tabs: take as directed per coumadin clinic.  The target INR is 2 - 3.  The next INR is due 04/24/2009.  Anticoagulation instructions were given to patient.  Results were reviewed/authorized by Peter Hey, RN.  He was notified by Peter Hey RN.         Prior Anticoagulation Instructions: INR 2.2 Continue coumadin 5mg  once daily except 2.5mg  on Mondays  Current Anticoagulation Instructions: INR 2.1 Continue coumadin 5mg  once daily except 2.5mg  on Mondays

## 2010-02-05 NOTE — Medication Information (Signed)
Summary: ccr-lr  Anticoagulant Therapy  Managed by: Vashti Hey, RN PCP: Dr. Quintin Alto Supervising MD: Myrtis Ser MD, Tinnie Gens Indication 1: Atrial Fibrillation (ICD-427.31) Lab Used: [name] [place] Site: [facility] INR POC 2.3  Dietary changes: no    Health status changes: no    Bleeding/hemorrhagic complications: no    Recent/future hospitalizations: no    Any changes in medication regimen? no    Recent/future dental: no  Any missed doses?: no       Is patient compliant with meds? yes       Allergies: 1)  ! Plavix (Clopidogrel Bisulfate)  Anticoagulation Management History:      The patient is taking warfarin and comes in today for a routine follow up visit.  Positive risk factors for bleeding include an age of 72 years or older.  The bleeding index is 'intermediate risk'.  Positive CHADS2 values include History of CHF and History of HTN.  Negative CHADS2 values include Age > 72 years old.  The start date was 05/26/2005.  Anticoagulation responsible Dijon Kohlman: Myrtis Ser MD, Tinnie Gens.  INR POC: 2.3.  Cuvette Lot#: [id number].  Exp: 10/11.    Anticoagulation Management Assessment/Plan:      The patient's current anticoagulation dose is Warfarin sodium 5 mg tabs: take as directed per coumadin clinic.  The target INR is 2 - 3.  The next INR is due 01/11/2010.  Anticoagulation instructions were given to patient.  Results were reviewed/authorized by Vashti Hey, RN.  He was notified by Vashti Hey RN.         Prior Anticoagulation Instructions: INR 2.2 Continue coumadin 5mg  once daily   Current Anticoagulation Instructions: INR 2.3 Continue coumadin 5mg  once daily

## 2010-02-05 NOTE — Assessment & Plan Note (Signed)
Summary: Pulmonary OV   Copy to:  [facility] Primary Provider/Referring Provider:  Dr. Quintin Alto  CC:  2 month follow up.  Pt states he does have SOB with activity, wheezing, and prod cough with white mucus but this is no worse from last OV.  Needs recertification for o2.Marland Kitchen  History of Present Illness: [name] is a 70 year old white male, history of chronic obstructive lung disease, primary emphysematous component,      July 18, 2009 1:56 PM Dyspnea is worse with heat and humidity.  Notes more cough,  mucus is white.  Not yellow or green.  Notes some wheeze.   On pred 5mg /d.  On spiriva and symbicort.   September 07, 2009 9:35 AM Dyspnea is the same.  Notes some cough.  Notes some wheezing.  Mucus is white and clear bp runs low.  Here for O2 recert. Pt denies any significant sore throat, nasal congestion or excess secretions, fever, chills, sweats, unintended weight loss, pleurtic or exertional chest pain, orthopnea PND, or leg swelling Pt denies any increase in rescue therapy over baseline, denies waking up needing it or having any early am or nocturnal exacerbations of coughing/wheezing/or dyspnea.    Preventive Screening-Counseling & Management  Alcohol-Tobacco     Alcohol drinks/day: 0     Smoking Status: quit > 6 months     Packs/Day: 2 pks      Year Started: 1961     Year Quit: 1999     Pack years: 47  Current Medications (verified): 1)  Adult Aspirin Low Strength 81 Mg  Tbdp (Aspirin) .... One By Mouth Once Daily 2)  Lovastatin 40 Mg  Tabs (Lovastatin) .... One By Mouth Once Daily 3)  Metoprolol Succinate 50 Mg  Tb24 (Metoprolol Succinate) .... 1/2 By Mouth Two Times A Day 4)  Warfarin Sodium 5 Mg Tabs (Warfarin Sodium) .... Take As Directed Per Coumadin Clinic 5)  Metformin Hcl 500 Mg  Tb24 (Metformin Hcl) .... Take 1 Tablet By Mouth Once A Day 6)  Klor-Con M20 20 Meq  Tbcr (Potassium Chloride Crys Cr) .... Take One Tablet By Mouth Daily 7)  Furosemide 80 Mg  Tabs (Furosemide) .... Take 1  Tablet By Mouth Two Times A Day 8)  Centrum  Tabs (Multiple Vitamins-Minerals) .... Once Daily 9)  Fish Oil 1000 Mg Caps (Omega-3 Fatty Acids) .Marland Kitchen.. 1 Cap Daily 10)  Vitamin C 500 Mg Tabs (Ascorbic Acid) .... Once Daily 11)  Gabapentin 300 Mg Caps (Gabapentin) .... 4 Tabs in Am and 4 Tablets in Pm 12)  Symbicort 160-4.5 Mcg/act  Aero (Budesonide-Formoterol Fumarate) .... Two Puffs Twice Daily 13)  Spiriva Handihaler 18 Mcg Caps (Tiotropium Bromide Monohydrate) .... Once Daily 14)  Prednisone 10 Mg  Tabs (Prednisone) .... 1/2 One Daily 15)  Oxygen .... 2l At Bedtime 16)  Losartan Potassium 100 Mg Tabs (Losartan Potassium) .... Take 1/2 Tablet By Mouth Once A Day  Allergies (verified): 1)  ! Plavix (Clopidogrel Bisulfate)  Past History:  Past medical, surgical, family and social histories (including risk factors) reviewed, and no changes noted (except as noted below).  Past Medical History: Reviewed history from 02/08/2009 and no changes required.  dyspnea..chronic... Dr. Danise Mina....COPD and primary emphysema. / O2 at nighttime started November, 2010 Atrial fibrillation...permanent Coumadin therapy. EF 60%... echo... June, 2010.... technically limited... mild LVH.. Right ventricle.... mild moderately dilated... echo... June, 2010.... mild RV dysfunction... RV pressure could not be estimated with no TR CAD.Marland KitchenMarland KitchenMI with a Taxus stent in 2004.Marland Kitchen  /  nuclear... June, 2010.. small scar or slight ischemia inferior wall Diabetes treated. Hyperlipidemia  Obesity.   peripheral edema..mild Hypertension  Past Surgical History: Reviewed history from 08/24/2008 and no changes required. 3 rotator cuff surgeries knee surgeries heart stent  Family History: Reviewed history from 07/06/2007 and no changes required. non contrib  Social History: Reviewed history from 08/24/2008 and no changes required. Patient states former smoker.  married and lives with  wife highway worker children  dog and cat  Review of Systems       The patient complains of shortness of breath with activity.  The patient denies shortness of breath at rest, productive cough, non-productive cough, coughing up blood, chest pain, irregular heartbeats, acid heartburn, indigestion, loss of appetite, weight change, abdominal pain, difficulty swallowing, sore throat, tooth/dental problems, headaches, nasal congestion/difficulty breathing through nose, sneezing, itching, ear ache, anxiety, depression, hand/feet swelling, joint stiffness or pain, rash, change in color of mucus, and fever.    Vital Signs:  Patient profile:   70 year old male Height:      70 inches Weight:      264 pounds BMI:     38.02 O2 Sat:      94 % on Room air Temp:     97.5 degrees F oral Pulse rate:   99 / minute BP sitting:   86 / 54  (left arm) Cuff size:   large  Vitals Entered By: Gweneth Dimitri RN (September 07, 2009 9:15 AM)  O2 Flow:  Room air CC: 2 month follow up.  Pt states he does have SOB with activity, wheezing, prod cough with white mucus but this is no worse from last OV.  Needs recertification for o2. Comments Medications reviewed with patient Daytime contact number verified with patient. Crystal Jones RN  September 07, 2009 9:13 AM    Physical Exam  Additional Exam:  Gen: WD WN    WM      in NAD    NCAT Heent:  no jvd, no TMG, no cervical LNademopathy, orophyx clear,  nares with clear watery drainage. Cor: RRR nl s1/s2  no s3/s4  no m r h g Abd: soft NT BSA   no masses  No HSM  no rebound or guarding Ext perfused with no c v e v.d Neuro: intact, moves all 4s, CN II-XII intact, DTRs intact Chest: distant BS  no  wheezes,  no rales, no rhonchi   no egophony  no consolidative breath sounds, mild hyperresonance to percussion Skin: clear  Genital/Rectal :deferred    Impression & Recommendations:  Problem # 1:  C O P D (ICD-496) Assessment Unchanged severe copd  plan   recheck ono on ra No change in inhaled medications.   Maintain treatment program as currently prescribed. flu vaccine  Problem # 2:  HYPERTENSION (ICD-401.9) Assessment: Unchanged HTN but not hypotensive plan hold losartan f/u wiht pcp md The following medications were removed from the medication list:    Losartan Potassium 100 Mg Tabs (Losartan potassium) .Marland Kitchen... Take 1 tablet by mouth once a day His updated medication list for this problem includes:    Metoprolol Succinate 50 Mg Tb24 (Metoprolol succinate) .Marland Kitchen... 1/2 by mouth two times a day    Furosemide 80 Mg Tabs (Furosemide) .Marland Kitchen... Take 1  tablet by mouth two times a day  Medications Added to Medication List This Visit: 1)  Metoprolol Succinate 50 Mg Tb24 (Metoprolol succinate) .... 1/2 by mouth two times a day 2)  Prednisone  10 Mg Tabs (Prednisone) .... 1/2 one daily  Complete Medication List: 1)  Adult Aspirin Low Strength 81 Mg Tbdp (Aspirin) .... One by mouth once daily 2)  Lovastatin 40 Mg Tabs (Lovastatin) .... One by mouth once daily 3)  Metoprolol Succinate 50 Mg Tb24 (Metoprolol succinate) .... 1/2 by mouth two times a day 4)  Warfarin Sodium 5 Mg Tabs (Warfarin sodium) .... Take as directed per coumadin clinic 5)  Metformin Hcl 500 Mg Tb24 (Metformin hcl) .... Take 1 tablet by mouth once a day 6)  Klor-con M20 20 Meq Tbcr (Potassium chloride crys cr) .... Take one tablet by mouth daily 7)  Furosemide 80 Mg Tabs (Furosemide) .... Take 1  tablet by mouth two times a day 8)  Centrum Tabs (Multiple vitamins-minerals) .... Once daily 9)  Fish Oil 1000 Mg Caps (Omega-3 fatty acids) .Marland Kitchen.. 1 cap daily 10)  Vitamin C 500 Mg Tabs (Ascorbic acid) .... Once daily 11)  Gabapentin 300 Mg Caps (Gabapentin) .... 4 tabs in am and 4 tablets in pm 12)  Symbicort 160-4.5 Mcg/act Aero (Budesonide-formoterol fumarate) .... Two puffs twice daily 13)  Spiriva Handihaler 18 Mcg Caps (Tiotropium bromide monohydrate) .... Once daily 14)   Prednisone 10 Mg Tabs (Prednisone) .... 1/2 one daily 15)  Oxygen  .... 2l at bedtime  Other Orders: Est. Patient Level III (45409) DME Referral (DME) Influenza Vaccine MCR (81191)  Patient Instructions: 1)  Stop losartan 2)  See your primary care MD soon for a blood pressure check 3)  No medication changes 4)  We will check oxygen at night on room air to recertify, stay on oxygen at night for now 5)  Flu vaccine  6)  Return 4 months   Immunizations Administered:  Influenza Vaccine # 1:    Vaccine Type: Fluvax MCR    Site: left deltoid    Mfr: [employer]oSmithK[employer]    Dose: 0.5 ml    Route: IM    Given by: Michel Bickers CMA    Exp. Date: 07/06/2010    Lot #: [id number]    VIS given: 07/31/09 version given September 07, 2009.  Flu Vaccine Consent Questions:    Do you have a history of severe allergic reactions to this vaccine? no    Any prior history of allergic reactions to egg and/or gelatin? no    Do you have a sensitivity to the preservative Thimersol? no    Do you have a past history of Guillan-Barre Syndrome? no    Do you currently have an acute febrile illness? no    Have you ever had a severe reaction to latex? no    Vaccine information given and explained to patient? yes   Prevention & Chronic Care Immunizations   Influenza vaccine: Fluvax MCR  (09/07/2009)    Tetanus booster: Not documented    Pneumococcal vaccine: Pneumovax  (10/28/2007)    H. zoster vaccine: Not documented  Colorectal Screening   Hemoccult: Not documented    Colonoscopy: Not documented  Other Screening   PSA: Not documented   Smoking status: quit > 6 months  (09/07/2009)  Lipids   Total Cholesterol: Not documented   LDL: Not documented   LDL Direct: Not documented   HDL: Not documented   Triglycerides: Not documented    SGOT (AST): Not documented   SGPT (ALT): Not documented   Alkaline phosphatase: Not documented   Total bilirubin: Not documented  Hypertension   Last Blood  Pressure: 86 / 54  (09/07/2009)  Serum creatinine: Not documented   Serum potassium Not documented  Self-Management Support :    Hypertension self-management support: Not documented    Lipid self-management support: Not documented    Nursing Instructions: Give Flu vaccine today    Appended Document: Pulmonary OV fax steve burdine

## 2010-02-05 NOTE — Miscellaneous (Signed)
Summary: Rehab Report/ CARDIAC REHAB PROGRESS REPORT  Rehab Report/ CARDIAC REHAB PROGRESS REPORT   Imported By: Dorise Hiss 07/25/2009 10:42:55  _____________________________________________________________________  External Attachment:    Type:   Image     Comment:   External Document

## 2010-02-05 NOTE — Assessment & Plan Note (Signed)
Summary: Pulmonary OV   Copy to:  [facility] Primary Provider/Referring Provider:  Dr. Quintin Alto  CC:  Follow up for o2 recert.  Pt states he does have SOB when walking, wheezing, and prod cough with white mucus but believes breathing is some better from last OV since weather is cooler. .  History of Present Illness: [name] is a 70 year old white male, history of chronic obstructive lung disease, primary emphysematous component,      July 18, 2009 1:56 PM Dyspnea is worse with heat and humidity.  Notes more cough,  mucus is white.  Not yellow or green.  Notes some wheeze.   On pred 5mg /d.  On spiriva and symbicort.   September 07, 2009 9:35 AM Dyspnea is the same.  Notes some cough.  Notes some wheezing.  Mucus is white and clear bp runs low.  Here for O2 recert. Pt denies any significant sore throat, nasal congestion or excess secretions, fever, chills, sweats, unintended weight loss, pleurtic or exertional chest pain, orthopnea PND, or leg swelling Pt denies any increase in rescue therapy over baseline, denies waking up needing it or having any early am or nocturnal exacerbations of coughing/wheezing/or dyspnea.  November 20, 2009 1:42 PM Since 9/11.  Seems better with cooler weather.  Notes some mucus and is white.  No discolor.  No real chst pain.  Notes some wheezing. Pt denies any significant sore throat, nasal congestion or excess secretions, fever, chills, sweats, unintended weight loss, pleurtic or exertional chest pain, orthopnea PND, or leg swelling Pt denies any increase in rescue therapy over baseline, denies waking up needing it or having any early am or nocturnal exacerbations of coughing/wheezing/or dyspnea.   Preventive Screening-Counseling & Management  Alcohol-Tobacco     Alcohol drinks/day: 0     Smoking Status: quit     Packs/Day: 2 pks      Year Started: 1961     Year Quit: 10 yrs ago     Pack years: 75  Current Medications (verified): 1)  Adult  Aspirin Low Strength 81 Mg  Tbdp (Aspirin) .... One By Mouth Once Daily 2)  Lovastatin 40 Mg  Tabs (Lovastatin) .... One By Mouth Once Daily 3)  Metoprolol Succinate 50 Mg  Tb24 (Metoprolol Succinate) .... 1/2 By Mouth Two Times A Day 4)  Warfarin Sodium 5 Mg Tabs (Warfarin Sodium) .... Take As Directed Per Coumadin Clinic 5)  Metformin Hcl 500 Mg  Tb24 (Metformin Hcl) .... Take 1 Tablet By Mouth Once A Day 6)  Klor-Con M20 20 Meq  Tbcr (Potassium Chloride Crys Cr) .... Take One Tablet By Mouth Daily 7)  Furosemide 80 Mg Tabs (Furosemide) .... Take 1  Tablet By Mouth Two Times A Day 8)  Centrum  Tabs (Multiple Vitamins-Minerals) .... Once Daily 9)  Fish Oil 1000 Mg Caps (Omega-3 Fatty Acids) .Marland Kitchen.. 1 Cap Daily 10)  Vitamin C 500 Mg Tabs (Ascorbic Acid) .... Once Daily 11)  Gabapentin 300 Mg Caps (Gabapentin) .... 4 Tabs in Am and 4 Tablets in Pm 12)  Symbicort 160-4.5 Mcg/act  Aero (Budesonide-Formoterol Fumarate) .... Two Puffs Twice Daily 13)  Spiriva Handihaler 18 Mcg Caps (Tiotropium Bromide Monohydrate) .... Once Daily 14)  Prednisone 10 Mg  Tabs (Prednisone) .... 1/2 One Daily 15)  Oxygen .... 2l At Bedtime  Allergies (verified): 1)  ! Plavix (Clopidogrel Bisulfate)  Past History:  Past medical, surgical, family and social histories (including risk factors) reviewed, and no changes noted (except as noted below).  Past Medical History: Reviewed history from 09/13/2009 and no changes required.  dyspnea..chronic... Dr. Danise Mina....COPD and primary emphysema. / O2 at nighttime started November, 2010 Atrial fibrillation...permanent Coumadin therapy. EF 60%... echo... June, 2010.... technically limited... mild LVH.. Right ventricle.... mild moderately dilated... echo... June, 2010.... mild RV dysfunction... RV pressure could not be estimated with no TR CAD.Marland KitchenMarland KitchenMI with a Taxus stent in 2004..  /   nuclear... June, 2010.. small scar or slight ischemia inferior wall Diabetes  treated. Hyperlipidemia  Obesity.   peripheral edema..mild Hypertension  ... relative hypotension... August, 2011... Cozaar stopped  Past Surgical History: Reviewed history from 08/24/2008 and no changes required. 3 rotator cuff surgeries knee surgeries heart stent  Family History: Reviewed history from 07/06/2007 and no changes required. non contrib  Social History: Reviewed history from 08/24/2008 and no changes required. Patient states former smoker.  Quit in 2000.  2ppd x 40 yrs. married and lives with wife highway worker children  dog and cat  Review of Systems       The patient complains of shortness of breath with activity and productive cough.  The patient denies shortness of breath at rest, non-productive cough, coughing up blood, chest pain, irregular heartbeats, acid heartburn, indigestion, loss of appetite, weight change, abdominal pain, difficulty swallowing, sore throat, tooth/dental problems, headaches, nasal congestion/difficulty breathing through nose, sneezing, itching, ear ache, anxiety, depression, hand/feet swelling, joint stiffness or pain, rash, change in color of mucus, and fever.    Vital Signs:  Patient profile:   70 year old male Height:      70 inches Weight:      258.38 pounds BMI:     37.21 O2 Sat:      91 % on Room air Temp:     97.5 degrees F oral Pulse rate:   96 / minute BP sitting:   108 / 76  (left arm) Cuff size:   large  Vitals Entered By: Peter Dimitri RN (November 20, 2009 1:35 PM)  O2 Flow:  Room air CC: Follow up for o2 recert.  Pt states he does have SOB when walking, wheezing, prod cough with white mucus but believes breathing is some better from last OV since weather is cooler.  Comments Medications reviewed with patient Daytime contact number verified with patient. Peter Jones RN  November 20, 2009 1:35 PM    Physical Exam  Additional Exam:  Gen: WD WN    WM      in NAD    NCAT Heent:  no jvd, no TMG, no cervical  LNademopathy, orophyx clear,  nares with clear watery drainage. Cor: RRR nl s1/s2  no s3/s4  no m r h g Abd: soft NT BSA   no masses  No HSM  no rebound or guarding Ext perfused with no c v e v.d Neuro: intact, moves all 4s, CN II-XII intact, DTRs intact Chest: distant BS  no  wheezes,  no rales, no rhonchi   no egophony  no consolidative breath sounds, mild hyperresonance to percussion Skin: clear  Genital/Rectal :deferred    Impression & Recommendations:  Problem # 1:  C O P D (ICD-496) Assessment Unchanged  severe copd  plan  cont Oxygen, the pt requalified for oxygen at bedtime No change in inhaled medications.   Maintain treatment program as currently prescribed.  Complete Medication List: 1)  Adult Aspirin Low Strength 81 Mg Tbdp (Aspirin) .... One by mouth once daily 2)  Lovastatin 40 Mg Tabs (Lovastatin) .... One by  mouth once daily 3)  Metoprolol Succinate 50 Mg Tb24 (Metoprolol succinate) .... 1/2 by mouth two times a day 4)  Warfarin Sodium 5 Mg Tabs (Warfarin sodium) .... Take as directed per coumadin clinic 5)  Metformin Hcl 500 Mg Tb24 (Metformin hcl) .... Take 1 tablet by mouth once a day 6)  Klor-con M20 20 Meq Tbcr (Potassium chloride crys cr) .... Take one tablet by mouth daily 7)  Furosemide 80 Mg Tabs (Furosemide) .... Take 1  tablet by mouth two times a day 8)  Centrum Tabs (Multiple vitamins-minerals) .... Once daily 9)  Fish Oil 1000 Mg Caps (Omega-3 fatty acids) .Marland Kitchen.. 1 cap daily 10)  Vitamin C 500 Mg Tabs (Ascorbic acid) .... Once daily 11)  Gabapentin 300 Mg Caps (Gabapentin) .... 4 tabs in am and 4 tablets in pm 12)  Symbicort 160-4.5 Mcg/act Aero (Budesonide-formoterol fumarate) .... Two puffs twice daily 13)  Spiriva Handihaler 18 Mcg Caps (Tiotropium bromide monohydrate) .... Once daily 14)  Prednisone 10 Mg Tabs (Prednisone) .... 1/2 one daily 15)  Oxygen  .... 2l at bedtime  Other Orders: DME Referral (DME) Est. Patient Level III  ([address])  Clinical Reports Reviewed:  Sleep Study:  09/12/2009:  Low Oxygen Sat:   64%  Oximetry overnight on       RA           =   < 89%  11/21/2008:  Low Oxygen Sat:  Oximetry overnight on      RA            =   82%   SPO2 < 88 45% of time   11/14/2008:  Low Oxygen Sat:  Oximetry overnight on          RA        =   71%   Patient Instructions: 1)  No change in medications 2)  Return in     4-5     months 3)  You requalify for oxygen    Appended Document: Pulmonary OV fax [name]

## 2010-02-05 NOTE — Assessment & Plan Note (Signed)
Summary: Pulmonary OV   Copy to:  [facility] Primary Provider/Referring Provider:  Dr. Quintin Alto  CC:  4 month follow up, Breathing still bad, SOB with and without activity, pt states the heat is making it worse, and pt states other than the bretahing he has no other problems.  History of Present Illness: [name] is a 70 year old white male, history of chronic obstructive lung disease, primary emphysematous component,      March 14, 2009 9:57 AM Since last ov doing ok.  Dyspnea is better with oxygen.  Sl amount of cough for three months.  white mucous .    edema is better , overall pt is stable Pt denies any significant sore throat, nasal congestion or excess secretions, fever, chills, sweats, unintended weight loss, pleurtic or exertional chest pain, orthopnea PND, or leg swelling Pt denies any increase in rescue therapy over baseline, denies waking up needing it or having any early am or nocturnal exacerbations of coughing/wheezing/or dyspnea.  July 18, 2009 1:56 PM Dyspnea is worse with heat and humidity.  Notes more cough,  mucus is white.  Not yellow or green.  Notes some wheeze.   On pred 5mg /d.  On spiriva and symbicort.   Preventive Screening-Counseling & Management  Alcohol-Tobacco     Alcohol drinks/day: 0     Smoking Status: quit > 6 months     Packs/Day: 2 pks      Year Started: 1961     Year Quit: 1999  Current Medications (verified): 1)  Adult Aspirin Low Strength 81 Mg  Tbdp (Aspirin) .... One By Mouth Once Daily 2)  Lovastatin 40 Mg  Tabs (Lovastatin) .... One By Mouth Once Daily 3)  Metoprolol Succinate 50 Mg  Tb24 (Metoprolol Succinate) .... One By Mouth Two Times A Day 4)  Warfarin Sodium 5 Mg Tabs (Warfarin Sodium) .... Take As Directed Per Coumadin Clinic 5)  Metformin Hcl 500 Mg  Tb24 (Metformin Hcl) .... Take 1 Tablet By Mouth Once A Day 6)  Klor-Con M20 20 Meq  Tbcr (Potassium Chloride Crys Cr) .... Take One Tablet By Mouth Daily 7)  Furosemide 80  Mg Tabs (Furosemide) .... Take 1  Tablet By Mouth Two Times A Day 8)  Centrum  Tabs (Multiple Vitamins-Minerals) .... Once Daily 9)  Fish Oil 1000 Mg Caps (Omega-3 Fatty Acids) .Marland Kitchen.. 1 Cap Daily 10)  Vitamin C 500 Mg Tabs (Ascorbic Acid) .... Once Daily 11)  Gabapentin 300 Mg Caps (Gabapentin) .... 4 Tabs in Am and 4 Tablets in Pm 12)  Symbicort 160-4.5 Mcg/act  Aero (Budesonide-Formoterol Fumarate) .... Two Puffs Twice Daily 13)  Spiriva Handihaler 18 Mcg Caps (Tiotropium Bromide Monohydrate) .... Once Daily 14)  Prednisone 10 Mg  Tabs (Prednisone) .... Take One-Half  By Mouth Daily 15)  Oxygen .... 2l At Bedtime 16)  Losartan Potassium 100 Mg Tabs (Losartan Potassium) .... Take 1 Tablet By Mouth Once A Day  Allergies (verified): 1)  ! Plavix (Clopidogrel Bisulfate)  Past History:  Past medical, surgical, family and social histories (including risk factors) reviewed, and no changes noted (except as noted below).  Past Medical History: Reviewed history from 02/08/2009 and no changes required.  dyspnea..chronic... Dr. Danise Mina....COPD and primary emphysema. / O2 at nighttime started November, 2010 Atrial fibrillation...permanent Coumadin therapy. EF 60%... echo... June, 2010.... technically limited... mild LVH.. Right ventricle.... mild moderately dilated... echo... June, 2010.... mild RV dysfunction... RV pressure could not be estimated with no TR CAD.Marland KitchenMarland KitchenMI with a Taxus stent in  2004..  /   nuclear... June, 2010.. small scar or slight ischemia inferior wall Diabetes treated. Hyperlipidemia  Obesity.   peripheral edema..mild Hypertension  Past Surgical History: Reviewed history from 08/24/2008 and no changes required. 3 rotator cuff surgeries knee surgeries heart stent  Family History: Reviewed history from 07/06/2007 and no changes required. non contrib  Social History: Reviewed history from 08/24/2008 and no changes required. Patient states former smoker.  married and  lives with wife highway worker children  dog and cat  Review of Systems       The patient complains of shortness of breath with activity, shortness of breath at rest, productive cough, and non-productive cough.  The patient denies coughing up blood, chest pain, irregular heartbeats, acid heartburn, indigestion, loss of appetite, weight change, abdominal pain, difficulty swallowing, sore throat, tooth/dental problems, headaches, nasal congestion/difficulty breathing through nose, sneezing, itching, ear ache, anxiety, depression, hand/feet swelling, joint stiffness or pain, rash, change in color of mucus, and fever.    Vital Signs:  Patient profile:   70 year old male Height:      70 inches Weight:      263.2 pounds O2 Sat:      92 % on Room air Temp:     97.9 degrees F oral Pulse rate:   89 / minute BP sitting:   120 / 80  (right arm) Cuff size:   regular  Vitals Entered By: Carver Fila (July 18, 2009 1:49 PM)  O2 Flow:  Room air CC: 4 month follow up, Breathing still bad, SOB with and without activity, pt states the heat is making it worse, pt states other than the bretahing he has no other problems Comments meds and allergies updated Mindy Silva  July 18, 2009 1:50 PM    Physical Exam  Additional Exam:  Gen: WD WN    WM      in NAD    NCAT Heent:  no jvd, no TMG, no cervical LNademopathy, orophyx clear,  nares with clear watery drainage. Cor: RRR nl s1/s2  no s3/s4  no m r h g Abd: soft NT BSA   no masses  No HSM  no rebound or guarding Ext perfused with no c v e v.d Neuro: intact, moves all 4s, CN II-XII intact, DTRs intact Chest: distant BS  few exp wheezes,  no rales, no rhonchi   no egophony  no consolidative breath sounds, mild hyperresonance to percussion Skin: clear  Genital/Rectal :deferred    Impression & Recommendations:  Problem # 1:  C O P D (ICD-496) Assessment Deteriorated  decline in COPD with primary emphysematous component and exac  plan Doxycycline  one twice daily Prednisone 10mg  4 each am x3days, 3 x 3days, 2 x 3days,  1  x 3days then 5mg  daily Use Qvar two puff twice daily until samples gone Stay on Spiriva/ symbicort Return 2 months  Medications Added to Medication List This Visit: 1)  Prednisone 10 Mg Tabs (Prednisone) .... 4 each am x3days, 3 x 3days, 2 x 3days, 1 x 3days then 5mg  daily 2)  Doxycycline Monohydrate 100 Mg Caps (Doxycycline monohydrate) .... By mouth twice daily  Complete Medication List: 1)  Adult Aspirin Low Strength 81 Mg Tbdp (Aspirin) .... One by mouth once daily 2)  Lovastatin 40 Mg Tabs (Lovastatin) .... One by mouth once daily 3)  Metoprolol Succinate 50 Mg Tb24 (Metoprolol succinate) .... One by mouth two times a day 4)  Warfarin Sodium 5 Mg Tabs (Warfarin  sodium) .... Take as directed per coumadin clinic 5)  Metformin Hcl 500 Mg Tb24 (Metformin hcl) .... Take 1 tablet by mouth once a day 6)  Klor-con M20 20 Meq Tbcr (Potassium chloride crys cr) .... Take one tablet by mouth daily 7)  Furosemide 80 Mg Tabs (Furosemide) .... Take 1  tablet by mouth two times a day 8)  Centrum Tabs (Multiple vitamins-minerals) .... Once daily 9)  Fish Oil 1000 Mg Caps (Omega-3 fatty acids) .Marland Kitchen.. 1 cap daily 10)  Vitamin C 500 Mg Tabs (Ascorbic acid) .... Once daily 11)  Gabapentin 300 Mg Caps (Gabapentin) .... 4 tabs in am and 4 tablets in pm 12)  Symbicort 160-4.5 Mcg/act Aero (Budesonide-formoterol fumarate) .... Two puffs twice daily 13)  Spiriva Handihaler 18 Mcg Caps (Tiotropium bromide monohydrate) .... Once daily 14)  Prednisone 10 Mg Tabs (Prednisone) .... 4 each am x3days, 3 x 3days, 2 x 3days, 1 x 3days then 5mg  daily 15)  Oxygen  .... 2l at bedtime 16)  Losartan Potassium 100 Mg Tabs (Losartan potassium) .... Take 1 tablet by mouth once a day 17)  Doxycycline Monohydrate 100 Mg Caps (Doxycycline monohydrate) .... By mouth twice daily  Other Orders: Est. Patient Level IV (60454)  Patient Instructions: 1)   Doxycycline one twice daily 2)  Prednisone 10mg  4 each am x3days, 3 x 3days, 2 x 3days,  1  x 3days then 5mg  daily 3)  Use Qvar two puff twice daily until samples gone 4)  Stay on Spiriva/ symbicort 5)  Return 2 months Prescriptions: DOXYCYCLINE MONOHYDRATE 100 MG  CAPS (DOXYCYCLINE MONOHYDRATE) By mouth twice daily  #14 x 0   Entered and Authorized by:   Storm Frisk MD   Signed by:   Storm Frisk MD on 07/18/2009   Method used:   Electronically to        [facility] [address] (retail)       [address]       [place], [place]  [address]       Ph: [phone] or [phone]       Fax: [phone]   RxID:   [id number] PREDNISONE 10 MG  TABS (PREDNISONE) 4 each am x3days, 3 x 3days, 2 x 3days, 1 x 3days then 5mg  daily  #30 x 0   Entered and Authorized by:   Storm Frisk MD   Signed by:   Storm Frisk MD on 07/18/2009   Method used:   Electronically to        [facility] [address] (retail)       [address]       [place], [place]  [address]       Ph: [phone] or [phone]       Fax: [phone]   RxID:   [id number]     Appended Document: Pulmonary OV fax steve burdine

## 2010-02-05 NOTE — Medication Information (Signed)
Summary: ccr-lr  Anticoagulant Therapy  Managed by: Vashti Hey, RN PCP: Dr. Quintin Alto Supervising MD: Andee Lineman MD, Michelle Piper Indication 1: Atrial Fibrillation (ICD-427.31) Lab [name] [place] Site: [place] INR POC 2.1  Dietary changes: no    Health status changes: no    Bleeding/hemorrhagic complications: no    Recent/future hospitalizations: no    Any changes in medication regimen? no    Recent/future dental: no  Any missed doses?: no       Is patient compliant with meds? yes       Allergies: 1)  ! Plavix (Clopidogrel Bisulfate)  Anticoagulation Management History:      The patient is taking warfarin and comes in today for a routine follow up visit.  Positive risk factors for bleeding include an age of 72 years or older.  The bleeding index is 'intermediate risk'.  Positive CHADS2 values include History of CHF and History of HTN.  Negative CHADS2 values include Age > 72 years old.  The start date was 05/26/2005.  Anticoagulation responsible provider: Andee Lineman MD, Michelle Piper.  INR POC: 2.1.  Cuvette Lot#: [id number].  Exp: 10/11.    Anticoagulation Management Assessment/Plan:      The patient's current anticoagulation dose is Warfarin sodium 5 mg tabs: take as directed per coumadin clinic.  The target INR is 2 - 3.  The next INR is due 11/16/2009.  Anticoagulation instructions were given to patient.  Results were reviewed/authorized by Vashti Hey, RN.  He was notified by Vashti Hey RN.         Prior Anticoagulation Instructions: INR 2.0 Take coumadin 7.5mg  tonight then resume 5mg  once daily   Current Anticoagulation Instructions: INR 2.1 Continue coumadin 5mg  once daily

## 2010-02-05 NOTE — Medication Information (Signed)
Summary: ccr-lr  Anticoagulant Therapy  Managed by: Vashti Hey, RN PCP: Dr. Quintin Alto Supervising MD: Andee Lineman MD, Michelle Piper Indication 1: Atrial Fibrillation (ICD-427.31) Lab [name] [place] Site: [place] INR POC 2.5  Dietary changes: no    Health status changes: no    Bleeding/hemorrhagic complications: no    Recent/future hospitalizations: no    Any changes in medication regimen? no    Recent/future dental: no  Any missed doses?: no       Is patient compliant with meds? yes       Allergies: 1)  ! Plavix (Clopidogrel Bisulfate)  Anticoagulation Management History:      The patient is taking warfarin and comes in today for a routine follow up visit.  Positive risk factors for bleeding include an age of 72 years or older.  The bleeding index is 'intermediate risk'.  Positive CHADS2 values include History of CHF and History of HTN.  Negative CHADS2 values include Age > 78 years old.  The start date was 05/26/2005.  Anticoagulation responsible provider: Andee Lineman MD, Michelle Piper.  INR POC: 2.5.  Cuvette Lot#: [id number].  Exp: 10/11.    Anticoagulation Management Assessment/Plan:      The patient's current anticoagulation dose is Warfarin sodium 5 mg tabs: take as directed per coumadin clinic.  The target INR is 2 - 3.  The next INR is due 07/27/2009.  Anticoagulation instructions were given to patient.  Results were reviewed/authorized by Vashti Hey, RN.  He was notified by Vashti Hey RN.         Prior Anticoagulation Instructions: INR 2.1 Continue coumadin 5mg  once daily   Current Anticoagulation Instructions: INR 2.5 Continue coumadin 5mg  once daily

## 2010-02-05 NOTE — Miscellaneous (Signed)
Summary: Rehab Report/ CARDIAC REHAB DISCHARGE SUMMARY  Rehab Report/ CARDIAC REHAB DISCHARGE SUMMARY   Imported By: Dorise Hiss 09/04/2009 16:44:27  _____________________________________________________________________  External Attachment:    Type:   Image     Comment:   External Document

## 2010-02-05 NOTE — Medication Information (Signed)
Summary: ccr-lr  Anticoagulant Therapy  Managed by: Vashti Hey, RN PCP: Arlina Robes, PA @ [facility] Supervising MD: Andee Lineman MD, Michelle Piper Indication 1: Atrial Fibrillation (ICD-427.31) Lab [name] [place] Site: [place] INR POC 2.2  Dietary changes: no    Health status changes: no    Bleeding/hemorrhagic complications: no    Recent/future hospitalizations: no    Any changes in medication regimen? no    Recent/future dental: no  Any missed doses?: no       Is patient compliant with meds? yes       Allergies: 1)  ! Plavix (Clopidogrel Bisulfate)  Anticoagulation Management History:      The patient is taking warfarin and comes in today for a routine follow up visit.  Positive risk factors for bleeding include an age of 58 years or older.  The bleeding index is 'intermediate risk'.  Positive CHADS2 values include History of CHF and History of HTN.  Negative CHADS2 values include Age > 1 years old.  The start date was 05/26/2005.  Anticoagulation responsible provider: Andee Lineman MD, Michelle Piper.  INR POC: 2.2.  Cuvette Lot#: [id number].  Exp: 10/11.    Anticoagulation Management Assessment/Plan:      The patient's current anticoagulation dose is Warfarin sodium 5 mg tabs: take as directed per coumadin clinic.  The target INR is 2 - 3.  The next INR is due 02/27/2009.  Anticoagulation instructions were given to patient.  Results were reviewed/authorized by Vashti Hey, RN.  He was notified by Vashti Hey RN.         Prior Anticoagulation Instructions: INR 2.1 Contiue coumadin 5mg  once daily except 2.5mg  on Mondays  Current Anticoagulation Instructions: INR 2.2 Continue coumadin 5mg  once daily except 2.5mg  on Mondays

## 2010-02-05 NOTE — Assessment & Plan Note (Signed)
Summary: Pulmonary OV   Copy to:  [facility] Primary Provider/Referring Provider:  Dr. Quintin Alto  CC:  3 month copd follow up.  states breathing is a little better since starting o2 qhs.  still coughing-prod with white mucus-states this has been going on x 3-25months.  denies wheezing and chest tightness.  [name]  History of Present Illness: [name] is a 70 year old white male, history of chronic obstructive lung disease, primary emphysematous component,     November 13, 2008 4:26 PM The pt notes symptoms since sept are sl worse.  The pt had to get up and sit on edge of bed to breathe. The pt saw cardiology  11/8:  they felt has diastolic chf and 10# weight gain The pt sleeps on two pillows Pt notes some cough now.  Pt feels like something in the throat.  Pt notes llike phlegm.    If spit up  is white  for two months no chest pain There is more edema in feet, and the  abd is more distended.  December 13, 2008 11:03 AM Dyspnea is better.  Edema is less in feet.  Now on 2L oxygen at bedtime  and ONO on RA 82%.  This has helped the pedal edema and dyspnea. Still has white phlegm in the throat.  ? if mucous drains down the back into the lungs. No real chest pain.     March 14, 2009 9:57 AM Since last ov doing ok.  Dyspnea is better with oxygen.  Sl amount of cough for three months.  white mucous .    edema is better , overall pt is stable Pt denies any significant sore throat, nasal congestion or excess secretions, fever, chills, sweats, unintended weight loss, pleurtic or exertional chest pain, orthopnea PND, or leg swelling Pt denies any increase in rescue therapy over baseline, denies waking up needing it or having any early am or nocturnal exacerbations of coughing/wheezing/or dyspnea.   Preventive Screening-Counseling & Management  Alcohol-Tobacco     Smoking Status: quit > 6 months  Current Medications (verified): 1)  Adult Aspirin Low Strength 81 Mg  Tbdp (Aspirin) .... One By  Mouth Once Daily 2)  Lovastatin 40 Mg  Tabs (Lovastatin) .... One By Mouth Once Daily 3)  Metoprolol Succinate 50 Mg  Tb24 (Metoprolol Succinate) .... One By Mouth Two Times A Day 4)  Warfarin Sodium 5 Mg Tabs (Warfarin Sodium) .... Take As Directed Per Coumadin Clinic 5)  Metformin Hcl 500 Mg  Tb24 (Metformin Hcl) .... Take 1 Tablet By Mouth Once A Day 6)  Klor-Con M20 20 Meq  Tbcr (Potassium Chloride Crys Cr) .... Take One Tablet By Mouth Daily 7)  Furosemide 80 Mg Tabs (Furosemide) .... Take 1  Tablet By Mouth Two Times A Day 8)  Centrum  Tabs (Multiple Vitamins-Minerals) .... Once Daily 9)  Fish Oil 1000 Mg Caps (Omega-3 Fatty Acids) .[name].. 1 Cap Daily 10)  Vitamin C 500 Mg Tabs (Ascorbic Acid) .... Once Daily 11)  Gabapentin 300 Mg Caps (Gabapentin) .... 4 Tabs in Am and 4 Tablets in Pm 12)  Symbicort 160-4.5 Mcg/act  Aero (Budesonide-Formoterol Fumarate) .... Two Puffs Twice Daily 13)  Spiriva Handihaler 18 Mcg Caps (Tiotropium Bromide Monohydrate) .... Once Daily 14)  Prednisone 10 Mg  Tabs (Prednisone) .... Take One-Half  By Mouth Daily 15)  Oxygen .... 2l At Bedtime 16)  Losartan Potassium 100 Mg Tabs (Losartan Potassium) .... Take 1 Tablet By Mouth Once A Day  Allergies (verified): 1)  ! Plavix (Clopidogrel Bisulfate)  Past History:  Past medical, surgical, family and social histories (including risk factors) reviewed, and no changes noted (except as noted below).  Past Medical History: Reviewed history from 02/08/2009 and no changes required.  dyspnea..chronic... Dr. Danise Mina....COPD and primary emphysema. / O2 at nighttime started November, 2010 Atrial fibrillation...permanent Coumadin therapy. EF 60%... echo... June, 2010.... technically limited... mild LVH.. Right ventricle.... mild moderately dilated... echo... June, 2010.... mild RV dysfunction... RV pressure could not be estimated with no TR CAD.Peter KitchenMarland KitchenMI with a Taxus stent in 2004..  /   nuclear... June, 2010.. small scar  or slight ischemia inferior wall Diabetes treated. Hyperlipidemia  Obesity.   peripheral edema..mild Hypertension  Past Surgical History: Reviewed history from 08/24/2008 and no changes required. 3 rotator cuff surgeries knee surgeries heart stent  Family History: Reviewed history from 07/06/2007 and no changes required. non contrib  Social History: Reviewed history from 08/24/2008 and no changes required. Patient states former smoker.  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  The patient denies shortness of breath at rest, non-productive cough, coughing up blood, chest pain, irregular heartbeats, acid heartburn, indigestion, loss of appetite, abdominal pain, difficulty swallowing, sore throat, tooth/dental problems, headaches, nasal congestion/difficulty breathing through nose, sneezing, itching, ear ache, anxiety, depression, joint stiffness or pain, rash, change in color of mucus, and fever.    Vital Signs:  Patient profile:   70 year old male Height:      70 inches Weight:      271 pounds BMI:     39.03 O2 Sat:      93 % on Room air Temp:     97.4 degrees F oral Pulse rate:   80 / minute BP sitting:   100 / 72  (left arm) Cuff size:   large  Vitals Entered By: Gweneth Dimitri RN (March 14, 2009 9:31 AM)  Nutrition Counseling: Patient's BMI is greater than 25 and therefore counseled on weight management options.  O2 Flow:  Room air CC: 3 month copd follow up.  states breathing is a little better since starting o2 qhs.  still coughing-prod with white mucus-states this has been going on x 3-25months.  denies wheezing and chest tightness.   Comments Medications reviewed with patient Daytime contact number verified with patient. Gweneth Dimitri RN  March 14, 2009 9:32 AM    Physical Exam  Additional  Exam:  Gen: WD WN    WM      in NAD    NCAT Heent:  no jvd, no TMG, no cervical LNademopathy, orophyx clear,  nares with clear watery drainage. Cor: RRR nl s1/s2  no s3/s4  no m r h g Abd: soft NT BSA   no masses  No HSM  no rebound or guarding Ext perfused with no c v e v.d Neuro: intact, moves all 4s, CN II-XII intact, DTRs intact Chest: distant BS  no wheezes,  no rales, no rhonchi   no egophony  no consolidative breath sounds, mild hyperresonance to percussion Skin: clear  Genital/Rectal :deferred    Impression & Recommendations:  Problem # 1:  C O P D (ICD-496) Assessment Improved  Improved COPD with primary emphysematous component, improving DPI technique  plan No change in inhaled medications.   Maintain treatment program  as currently prescribed. cont prednisone at 5mg /d cont nocturnal oxygen  Complete Medication List: 1)  Adult Aspirin Low Strength 81 Mg Tbdp (Aspirin) .... One by mouth once daily 2)  Lovastatin 40 Mg Tabs (Lovastatin) .... One by mouth once daily 3)  Metoprolol Succinate 50 Mg Tb24 (Metoprolol succinate) .... One by mouth two times a day 4)  Warfarin Sodium 5 Mg Tabs (Warfarin sodium) .... Take as directed per coumadin clinic 5)  Metformin Hcl 500 Mg Tb24 (Metformin hcl) .... Take 1 tablet by mouth once a day 6)  Klor-con M20 20 Meq Tbcr (Potassium chloride crys cr) .... Take one tablet by mouth daily 7)  Furosemide 80 Mg Tabs (Furosemide) .... Take 1  tablet by mouth two times a day 8)  Centrum Tabs (Multiple vitamins-minerals) .... Once daily 9)  Fish Oil 1000 Mg Caps (Omega-3 fatty acids) .[name].. 1 cap daily 10)  Vitamin C 500 Mg Tabs (Ascorbic acid) .... Once daily 11)  Gabapentin 300 Mg Caps (Gabapentin) .... 4 tabs in am and 4 tablets in pm 12)  Symbicort 160-4.5 Mcg/act Aero (Budesonide-formoterol fumarate) .... Two puffs twice daily 13)  Spiriva Handihaler 18 Mcg Caps (Tiotropium bromide monohydrate) .... Once daily 14)  Prednisone 10 Mg Tabs  (Prednisone) .... Take one-half  by mouth daily 15)  Oxygen  .... 2l at bedtime 16)  Losartan Potassium 100 Mg Tabs (Losartan potassium) .... Take 1 tablet by mouth once a day  Other Orders: Est. Patient Level III (25366)  Patient Instructions: 1)  Watch your weight 2)  No change in medications 3)  Return 4 months   Immunization History:  Influenza Immunization History:    Influenza:  historical (10/06/2008)   Appended Document: Pulmonary OV fax Quintin Alto

## 2010-02-07 NOTE — Medication Information (Signed)
Summary: ccr-lr  Anticoagulant Therapy  Managed by: Vashti Hey, RN PCP: Dr. Quintin Alto Supervising MD: Andee Lineman MD, Michelle Piper Indication 1: Atrial Fibrillation (ICD-427.31) [name] [place] Site: [place] INR POC 2.0  Dietary changes: no    Health status changes: no    Bleeding/hemorrhagic complications: no    Recent/future hospitalizations: no    Any changes in medication regimen? no    Recent/future dental: no  Any missed doses?: no       Is patient compliant with meds? yes       Allergies: 1)  ! Plavix (Clopidogrel Bisulfate)  Anticoagulation Management History:      The patient is taking warfarin and comes in today for a routine follow up visit.  Positive risk factors for bleeding include an age of 35 years or older.  The bleeding index is 'intermediate risk'.  Positive CHADS2 values include History of CHF and History of HTN.  Negative CHADS2 values include Age > 31 years old.  The start date was 05/26/2005.  Anticoagulation responsible provider: Andee Lineman MD, Michelle Piper.  INR POC: 2.0.  Cuvette Lot#: [id number].  Exp: 10/11.    Anticoagulation Management Assessment/Plan:      The patient's current anticoagulation dose is Warfarin sodium 5 mg tabs: take as directed per coumadin clinic.  The target INR is 2 - 3.  The next INR is due 02/08/2010.  Anticoagulation instructions were given to patient.  Results were reviewed/authorized by Vashti Hey, RN.  He was notified by Vashti Hey RN.         Prior Anticoagulation Instructions: INR 2.3 Continue coumadin 5mg  once daily   Current Anticoagulation Instructions: INR 2.0 Take coumadin 7.5mg  tonight then resume 5mg  once daily

## 2010-02-08 ENCOUNTER — Ambulatory Visit: Admit: 2010-02-08 | Payer: Self-pay

## 2010-02-08 ENCOUNTER — Encounter: Payer: Self-pay | Admitting: Cardiology

## 2010-02-08 ENCOUNTER — Encounter (INDEPENDENT_AMBULATORY_CARE_PROVIDER_SITE_OTHER): Payer: Medicare Other

## 2010-02-08 DIAGNOSIS — I4891 Unspecified atrial fibrillation: Secondary | ICD-10-CM

## 2010-02-08 DIAGNOSIS — Z7901 Long term (current) use of anticoagulants: Secondary | ICD-10-CM

## 2010-02-08 LAB — CONVERTED CEMR LAB: POC INR: 1.8

## 2010-02-13 NOTE — Medication Information (Signed)
Summary: ccr-lr  Anticoagulant Therapy  Managed by: Vashti Hey, RN PCP: Dr. Quintin Alto Supervising MD: Myrtis Ser MD, Tinnie Gens Indication 1: Atrial Fibrillation (ICD-427.31) Lab [name] [place] Site: [place] INR POC 1.8  Dietary changes: no    Health status changes: yes       Details: lt knee swollen  Bleeding/hemorrhagic complications: no    Recent/future hospitalizations: no    Any changes in medication regimen? yes       Details: Was on Bactrim DS x 10 days   Finished 01/31/10  Recent/future dental: no  Any missed doses?: no       Is patient compliant with meds? yes       Allergies: 1)  ! Plavix (Clopidogrel Bisulfate)  Anticoagulation Management History:      The patient is taking warfarin and comes in today for a routine follow up visit.  Positive risk factors for bleeding include an age of 72 years or older.  The bleeding index is 'intermediate risk'.  Positive CHADS2 values include History of CHF and History of HTN.  Negative CHADS2 values include Age > 38 years old.  The start date was 05/26/2005.  Anticoagulation responsible provider: Myrtis Ser MD, Tinnie Gens.  INR POC: 1.8.  Cuvette Lot#: [id number].  Exp: 10/11.    Anticoagulation Management Assessment/Plan:      The patient's current anticoagulation dose is Warfarin sodium 5 mg tabs: take as directed per coumadin clinic.  The target INR is 2 - 3.  The next INR is due 03/08/2010.  Anticoagulation instructions were given to patient.  Results were reviewed/authorized by Vashti Hey, RN.  He was notified by Vashti Hey RN.         Prior Anticoagulation Instructions: INR 2.0 Take coumadin 7.5mg  tonight then resume 5mg  once daily   Current Anticoagulation Instructions: INR 1.8 Take coumadin 1 1/2 tablets x 2 then resume 1 tablet once daily

## 2010-03-08 ENCOUNTER — Encounter: Payer: Self-pay | Admitting: Cardiology

## 2010-03-08 ENCOUNTER — Encounter (INDEPENDENT_AMBULATORY_CARE_PROVIDER_SITE_OTHER): Payer: Medicare Other

## 2010-03-08 DIAGNOSIS — I4891 Unspecified atrial fibrillation: Secondary | ICD-10-CM

## 2010-03-08 DIAGNOSIS — Z7901 Long term (current) use of anticoagulants: Secondary | ICD-10-CM

## 2010-03-08 LAB — CONVERTED CEMR LAB: POC INR: 1.7

## 2010-03-14 NOTE — Medication Information (Signed)
Summary: ccr-lr  Anticoagulant Therapy  Managed by: Vashti Hey, RN PCP: Dr. Quintin Alto Supervising MD: Andee Lineman MD, Michelle Piper Indication 1: Atrial Fibrillation (ICD-427.31) Lab [facility]: [name] [place] Site: [place] INR POC 1.7  Dietary changes: no    Health status changes: no    Bleeding/hemorrhagic complications: no    Recent/future hospitalizations: no    Any changes in medication regimen? no    Recent/future dental: no  Any missed doses?: no       Is patient compliant with meds? yes       Allergies: 1)  ! Plavix (Clopidogrel Bisulfate)  Anticoagulation Management History:      The patient is taking warfarin and comes in today for a routine follow up visit.  Positive risk factors for bleeding include an age of 72 years or older.  The bleeding index is 'intermediate risk'.  Positive CHADS2 values include History of CHF and History of HTN.  Negative CHADS2 values include Age > 34 years old.  The start date was 05/26/2005.  Anticoagulation responsible provider: Andee Lineman MD, Michelle Piper.  INR POC: 1.7.  Cuvette Lot#: [id number].  Exp: 10/11.    Anticoagulation Management Assessment/Plan:      The patient's current anticoagulation dose is Warfarin sodium 5 mg tabs: take as directed per coumadin clinic.  The target INR is 2 - 3.  The next INR is due 04/05/2010.  Anticoagulation instructions were given to patient.  Results were reviewed/authorized by Vashti Hey, RN.  He was notified by Vashti Hey RN.         Prior Anticoagulation Instructions: INR 1.8 Take coumadin 1 1/2 tablets x 2 then resume 1 tablet once daily   Current Anticoagulation Instructions: INR 1.7 Increase coumadin to 5mg  once daily except 7.5mg  on Tuesdays and Fridays

## 2010-03-30 ENCOUNTER — Encounter: Payer: Self-pay | Admitting: Cardiology

## 2010-03-30 DIAGNOSIS — Z7901 Long term (current) use of anticoagulants: Secondary | ICD-10-CM

## 2010-03-30 DIAGNOSIS — I4891 Unspecified atrial fibrillation: Secondary | ICD-10-CM

## 2010-04-05 ENCOUNTER — Ambulatory Visit (INDEPENDENT_AMBULATORY_CARE_PROVIDER_SITE_OTHER): Payer: Medicare Other | Admitting: *Deleted

## 2010-04-05 DIAGNOSIS — I4891 Unspecified atrial fibrillation: Secondary | ICD-10-CM

## 2010-04-05 DIAGNOSIS — Z7901 Long term (current) use of anticoagulants: Secondary | ICD-10-CM

## 2010-04-08 ENCOUNTER — Encounter: Payer: Self-pay | Admitting: *Deleted

## 2010-04-09 ENCOUNTER — Ambulatory Visit (INDEPENDENT_AMBULATORY_CARE_PROVIDER_SITE_OTHER): Payer: Medicare Other | Admitting: Cardiology

## 2010-04-09 ENCOUNTER — Encounter: Payer: Self-pay | Admitting: Cardiology

## 2010-04-09 DIAGNOSIS — I2510 Atherosclerotic heart disease of native coronary artery without angina pectoris: Secondary | ICD-10-CM

## 2010-04-09 DIAGNOSIS — I5033 Acute on chronic diastolic (congestive) heart failure: Secondary | ICD-10-CM

## 2010-04-09 DIAGNOSIS — Z7901 Long term (current) use of anticoagulants: Secondary | ICD-10-CM

## 2010-04-09 DIAGNOSIS — I4891 Unspecified atrial fibrillation: Secondary | ICD-10-CM

## 2010-04-09 DIAGNOSIS — M79609 Pain in unspecified limb: Secondary | ICD-10-CM

## 2010-04-09 DIAGNOSIS — R0609 Other forms of dyspnea: Secondary | ICD-10-CM

## 2010-04-09 DIAGNOSIS — M79673 Pain in unspecified foot: Secondary | ICD-10-CM

## 2010-04-09 NOTE — Assessment & Plan Note (Signed)
Fluid status is stable.  No significant change.

## 2010-04-09 NOTE — Assessment & Plan Note (Signed)
This is a chronic problem.  No significant change.

## 2010-04-09 NOTE — Assessment & Plan Note (Signed)
Rate is controlled.  No significant change.

## 2010-04-09 NOTE — Patient Instructions (Signed)
Your physician wants you to follow-up in: 6 months. You will receive a reminder letter in the mail one-two months in advance. If you don't receive a letter, please call our office to schedule the follow-up appointment. Your physician recommends that you continue on your current medications as directed. Please refer to the Current Medication list given to you today. 

## 2010-04-09 NOTE — Progress Notes (Signed)
HPI The patient is seen today in followup of coronary disease.  He's not had any significant chest pain.  He has chronic shortness of breath it is stable.  His volume status is stable.  His main problem is burning in his feet.  He has been seen by a specialist at [facility].  The only recommendation is for different type of shoes and he is looking into these. Allergies  Allergen Reactions  . Clopidogrel Bisulfate     REACTION: rash    Current Outpatient Prescriptions  Medication Sig Dispense Refill  . Ascorbic Acid (VITAMIN C) 500 MG tablet Take 500 mg by mouth daily.        Marland Kitchen aspirin 81 MG tablet Take 81 mg by mouth daily.        . budesonide-formoterol (SYMBICORT) 160-4.5 MCG/ACT inhaler Inhale 2 puffs into the lungs daily.       . furosemide (LASIX) 80 MG tablet Take 80 mg by mouth 2 (two) times daily.        Marland Kitchen gabapentin (NEURONTIN) 300 MG capsule Take 4 tablets by mouth two times a day.       . lovastatin (MEVACOR) 40 MG tablet Take 40 mg by mouth at bedtime.        . metFORMIN (GLUCOPHAGE) 500 MG tablet Take 500 mg by mouth daily.        . metoprolol (TOPROL-XL) 50 MG 24 hr tablet Take 1/2 tablet by mouth two times a day.        . Multiple Vitamins-Minerals (CENTRUM PO) Take 1 tablet by mouth daily.        . Omega-3 Fatty Acids (FISH OIL) 1000 MG CAPS Take 1 capsule by mouth daily.        . OXYGEN-HELIUM IN Inhale 2 L/min into the lungs at bedtime.        . potassium chloride SA (K-DUR,KLOR-CON) 20 MEQ tablet Take 20 mEq by mouth daily.        . predniSONE (DELTASONE) 10 MG tablet Take 1/2 tablet by mouth daily.        Marland Kitchen tiotropium (SPIRIVA) 18 MCG inhalation capsule Place 18 mcg into inhaler and inhale daily.        Marland Kitchen warfarin (COUMADIN) 5 MG tablet Take by mouth as directed.          History   Social History  . Marital Status: Married    Spouse Name: N/A    Number of Children: N/A  . Years of Education: N/A   Occupational History  . Not on file.    Social History Main Topics  . Smoking status: Former Smoker -- 2.0 packs/day for 40 years    Quit date: 01/06/1998  . Smokeless tobacco: Not on file  . Alcohol Use: Not on file  . Drug Use: Not on file  . Sexually Active: Not on file   Other Topics Concern  . Not on file   Social History Narrative  . No narrative on file    No family history on file.  Past Medical History  Diagnosis Date  . COPD with emphysema     chronic Dyspnea, followed by Dr. Dennie Bible. Wright, oxygen at night started 11/2008  . Permanent atrial fibrillation   . Anticoagulant long-term use     Coumadin  . Dilated ventricle     Right Ventricle, mild moderately dilated by Echo 06/2008  . Ventricular dysfunction, right     RV pressure could not be estimated with no  TR  . CAD (coronary artery disease)     .MI with a Taxus stent in 2004..  /   nuclear... June, 2010.. small scar or slight ischemia inferior wall  . Diabetes mellitus   . Hyperlipemia   . Obesity   . Peripheral edema     Mild  . Hypertension     relative hypotension... August, 2011... Cozaar stopped    Past Surgical History  Procedure Date  . Rotator cuff repair     x 3  . Knee surgery     ROS  Patient denies fever, chills, headache, sweats, rash, change in vision, change in hearing, chest pain, cough, nausea vomiting, urinary symptoms.  All other systems are reviewed and are negative. PHYSICAL EXAM Patient is stable today.  He is overweight.  He is oriented to person time and place.  Affect is normal.  There is no xanthelasma.  Lungs are clear.  Respiratory effort is unlabored.  Cardiac exam reveals S1-S2.  No clicks or significant murmurs.  The abdomen is protuberant but soft.  Has no significant peripheral edema. Filed Vitals:   04/09/10 0930  BP: 128/83  Pulse: 103  Height: 5\' 10"  (1.778 m)  Weight: 249 lb (112.946 kg)    EKG EKG today is done and reviewed by me.  There is atrial fibrillation with a controlled  rate.    ASSESSMENT & PLAN

## 2010-04-09 NOTE — Assessment & Plan Note (Signed)
Coronary disease is stable. No further workup needed. 

## 2010-04-09 NOTE — Assessment & Plan Note (Signed)
Patient continues Coumadin long term for his atrial fibrillation.

## 2010-04-09 NOTE — Assessment & Plan Note (Signed)
Unfortunately there is nothing I can do from a cardiovascular viewpoint will help his neuropathy neuropathic pain in his feet

## 2010-04-11 ENCOUNTER — Other Ambulatory Visit: Payer: Self-pay | Admitting: Critical Care Medicine

## 2010-04-16 ENCOUNTER — Other Ambulatory Visit: Payer: Self-pay | Admitting: Cardiology

## 2010-05-03 ENCOUNTER — Ambulatory Visit (INDEPENDENT_AMBULATORY_CARE_PROVIDER_SITE_OTHER): Payer: Medicare Other | Admitting: *Deleted

## 2010-05-03 DIAGNOSIS — Z7901 Long term (current) use of anticoagulants: Secondary | ICD-10-CM

## 2010-05-03 DIAGNOSIS — I4891 Unspecified atrial fibrillation: Secondary | ICD-10-CM

## 2010-05-03 LAB — POCT INR: INR: 2.6

## 2010-05-06 ENCOUNTER — Encounter: Payer: Self-pay | Admitting: Critical Care Medicine

## 2010-05-08 ENCOUNTER — Ambulatory Visit: Payer: Medicare Other | Admitting: Critical Care Medicine

## 2010-05-10 ENCOUNTER — Other Ambulatory Visit: Payer: Self-pay | Admitting: Cardiology

## 2010-05-21 NOTE — Assessment & Plan Note (Signed)
[facility]                             PULMONARY OFFICE NOTE   NAME:[name], [name]                      MRN:          [id number]  DATE:07/15/2006                            DOB:          03-21-1940    PULMONARY CONSULTATION   HISTORY OF PRESENT ILLNESS:  [name] is a 70 year old white male  referred for dyspnea.  He has been short of breath for 6 months.  He is  short of breath with exertion only. When he goes 400 feet or greater he  gets dyspneic, worse when he goes up an incline or steps.  He has no  mucus or cough.  He has a slight wheeze, does want to tend to clear his  throat.  He is a exsmoker, smoking 2 packs a day for 42 years but quit  smoking 8 years ago.  No previous history of pneumonia.  He does have  irregular heartbeats, chronic atrial fibrillation.  Denies any  indigestion, loss of appetite, acid, heartburn.  There is no change in  weight, abdominal pain.  There is no difficulty swallowing, sore throat,  tooth or dental problems.  He has a slight degree of nasal congestion in  the morning.  Denies any hand or foot swelling.  He is currently on no  inhaled medications.   Recent echocardiogram in May 2008 showed mild left atrial enlargement,  mild mitral regurgitation, good LV function.  Normal right ventricular  dimensions are noted.   PAST MEDICAL HISTORY:  Positive for hypertension.  He had a previous MI  with stent placement in the past.  A history of back surgeries and  shoulder surgeries.  No known history of  stroke, cancer, allergies,  chronic headaches, HIV or other sleep disorders. He does have diabetes,  hyperlipidemia, obesity, chronic peripheral edema, chronic atrial  fibrillation and myocardial infarction with Taxus stent placement in  2004 but no active ischemia at this time.   MEDICATION ALLERGIES:  That of PLAVIX for which he gets a rash.   CURRENT MEDICATIONS:  1. Multivitamin daily.  2. Aspirin 81 mg  daily.  3. Lovastatin 40 mg daily.  4. Furosemide 80 mg daily.  5. Lisinopril 10 mg daily.  6. Metoprolol 50 mg b.i.d.  7. Gabapentin 900 mg b.i.d.  8. Vitamin C.  9. Omega-3 daily.  10.Warfarin daily.  11.Metformin 500 mg daily.  12.Potassium 20 mEq daily.   SOCIAL HISTORY:  Exsmoker as noted above.  Retired.  Lives with his  wife.   FAMILY HISTORY:  Noncontributory.   REVIEW OF SYSTEMS:  Otherwise noncontributory.   PHYSICAL EXAMINATION:  He is an obese white male in no distress.  Weight 244 pounds, temp 97, blood pressure 110/72, pulse 82, saturation  94% on room air.  CHEST:  Distant breath sounds with prolonged expiratory phase.  No  wheeze or rhonchi were noted.  CARDIAC:  A regular rate and rhythm without S3.  Normal S1, S2.  ABDOMEN:  Soft, protuberant.  EXTREMITIES:  No clubbing, edema or venous disease.  SKIN:  Clear.  NEUROLOGIC:  Intact.  HEENT:  No jugular venous distention, no adenopathy, oropharynx clear.  NECK:  Supple.   Spirometry was obtained today and showed severe obstructive defect with  an FEV1 of 0.94, FVC of 2.39.  FEV1 to  FVC ratio 49% predicted,  compatible with severe obstructive defect.  Chest x-ray was not  available today for review from [facility].   IMPRESSION:  Is that of chronic obstructive lung disease with chronic  airflow obstruction.  It is hard to tell how much is asthma versus  emphysema and bronchitis without full pulmonary functions.   PLAN:  To begin Spiriva 1 capsule daily and in a HandiHaler.  The  patient was instructed as its proper use.  We also spent a good deal of  time regarding weight loss and how this would affect and improve his  breathing.  A weight loss diet plan was reviewed with the patient and we  will see the patient back in return followup in 6 weeks with a full set  of pulmonary functions at first.     Peter Hart E. Delford Field, MD, [facility]  Electronically Signed    PEW/MedQ  DD: 07/16/2006  DT:  07/16/2006  Job #: [id number]   cc:   [name], MD, [facility]

## 2010-05-21 NOTE — Assessment & Plan Note (Signed)
[facility]                          [facility]   Kail, Fraley AXELL TRIGUEROS                      MRN:          [id number]  DATE:05/11/2007                            DOB:          [birth year]    Mr. Bond is seen for follow-up.  His overall cardiac status is  stable.  He has shortness of breath that we feel is primarily from the  lungs.  He does have coronary disease, and this has been stable.   ALLERGIES:  PLAVIX (rash).   MEDICATIONS:  1. Multivitamin.  2. Aspirin.  3. Lovastatin.  4. Furosemide.  5. Lisinopril.  6. Metoprolol.  7. Gabapentin.  8. Omega 3 fish oil.  9. Vitamin C.  10.Coumadin.  11.Metformin.  12.Potassium.  13.Spiriva.   OTHER MEDICAL PROBLEMS:  See the list below.   REVIEW OF SYSTEMS:  He has burning in his feet.  I encouraged him to see  his primary team.  He asked me about potential referral to podiatry.  I  asked that he do this through his primary doctors.  Otherwise, his  Review of Systems is negative.   PHYSICAL EXAMINATION:  VITAL SIGNS:  Weight 243 pounds, blood pressure  100/72 with pulse 57.  GENERAL:  The patient is oriented to person, time, and place.  Affect is  normal.  HEENT:  Reveals no xanthelasma.  He has normal extraocular motion.  NECK:  There are no carotid bruits.  There is no jugular venous  distention.  LUNGS:  Clear.  Respiratory effort is not labored.  CARDIAC:  Reveals S1 with an S2.  There are no clicks or significant  murmurs.  ABDOMEN:  Soft.  He has normal bowel sounds.  EXTREMITIES:  There is question of trace peripheral edema.   EKG reveals atrial fibrillation with a controlled rate and no  significant change in the QRS.   PROBLEMS:  1. Chronic exertional dyspnea.  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  2. Atrial fibrillation.  His rate is controlled, and he is on      Coumadin.  3. Coumadin therapy.  4. Normal LV function.  5.  History of an MI with a Taxus stent in 2004.  He does have some      residual LAD disease.  We tried to be aggressive with him, but we      have only been able to get him to take Mevacor.  He is on a beta      blocker, and he is on an ACE inhibitor.  6. Diabetes treated.  7. Hyperlipidemia treated.  8. Obesity.  We are always encouraging weight loss.  9. Very mild chronic peripheral edema that is stable.  As always we      talked about limiting salt and fluid intake.  He is stable.  No      change in his cardiac meds.  I will see him in six months.     [name], MD, [facility]  Electronically Signed    JDK/MedQ  DD: 05/11/2007  DT: 05/11/2007  Job #: [id number]   cc:   Laural Roes, M.D.

## 2010-05-21 NOTE — Assessment & Plan Note (Signed)
[facility]                          [facility]   [name], Raffield KENNIE SNEDDEN                      MRN:          [id number]  DATE:06/18/2006                            DOB:          Jan 27, 1940    I saw Mr. Ontiveros on 05/25/06. See the complete dictation done by Gene  Serpe on that day. We saw the patient together. We decided to proceed  with an echocardiogram and a nuclear scan, and he is now seen back in  follow up. His echocardiogram shows that he has good LV function. There  may be some decrease in right ventricular function. His PA pressure was  26 mm of mercury. He had good LV wall motion. His adenosine Cardiolite  revealed that he good wall motion, but there was evidence of old  inferior scar that was mild to moderate, but there was no marked wall  motion abnormality and no ischemia. Today, he is back and he is feeling  well. We had lowered his Lisinopril dose from 20 to 10 mg and his blood  pressure is stable today at 124/70. He continues to have exertional  shortness of breath and we will now pursue further workup. Review of the  chart reveals that his last chest x-ray that I have was in 2004. We will  arrange for a follow up chest x-ray. I will then see him back. We will  then decide if we should refer him for pulmonary evaluation or a  cardiopulmonary exercise test.   PAST MEDICAL HISTORY:   ALLERGIES:  PLAVIX causes rash.   MEDICATIONS:  1. Centrum.  2. Metoprolol.  3. Mevacor.  4. Neurontin.  5. Omega 3's.  6. Coumadin.  7. Metformin.  8. Lasix.  9. KCL.  10.Lisinopril 10 mg.  11.Aspirin 81 mg.   OTHER MEDICAL PROBLEMS:  See the complete note on 05/25/06.   REVIEW OF SYSTEMS:  Other than his shortness of breath, he feels fine  and his review of systems otherwise is negative.   PHYSICAL EXAMINATION:  VITAL SIGNS:  Blood pressure 124/72 with a pulse  of 75. The patient is oriented to person, time, and place and his  affect  is normal.  HEENT:  He has no xanthelasma. There is normal extraocular motion.  NECK:  There are no carotid bruits. There is no jugular venous  distension.  LUNGS:  Clear.  CARDIAC:  S1 with an S2. There are no clicks or significant murmurs.  ABDOMEN:  Obese. He has normal bowel sounds.  EXTREMITIES:  There is trace edema.   PROBLEMS:  1. Chronic exertional dyspnea, see the note above.  2. Atrial fibrillation with rate control on Coumadin.  3. Normal LV function.  4. Coronary disease with an inferior infarct and Taxus stent in the      past in 2004. Also, residual LAD disease, but no proven ischemia      from his recent study.  5. Hypertension. Recently his blood pressure was in fact on the low      side. We lowered his Lotensin and this has improved.  6. Diabetes.  7. Hyperlipidemia.  8. Obesity.  9. Mild chronic peripheral edema and this is stable.   We will obtain a chest x-ray and I will see him back for early follow up  and then we will decide the approach to his ongoing shortness of breath.     [name], MD, [facility]  Electronically Signed    JDK/MedQ  DD: 06/18/2006  DT: 06/19/2006  Job #: [id number]   cc:   Laural Roes, PAC

## 2010-05-21 NOTE — Assessment & Plan Note (Signed)
[facility]                             PULMONARY OFFICE NOTE   NAME:HOPKINSKashmir, Lysaght                      MRN:          [id number]  DATE:12/07/2006                            DOB:          1940-07-11    Mr. Baldyga is a 70 year old white male, history of chronic obstructive  lung disease, primary emphysematous component, overall doing better on  Spiriva daily, states his level of breathing is improved.  He is having  decreased cough, decreased chest congestion.   EXAMINATION:  VITAL SIGNS:  On exam, temp is 97.6, blood pressure  120/72, pulse 70, saturation 92% room air.  CHEST:  Showed diminished breath sounds with no evidence of wheeze or  rhonchi.  Cardiac exam showed a regular rate and rhythm without S3,  normal S1, S2.  ABDOMEN:  Soft, nontender.  EXTREMITIES:  Showed no edema or clubbing.  SKIN:  Clear.  NEUROLOGIC:  Intact.  HEENT:  Showed no jugular venous distention, no lymphadenopathy.  Oropharynx clear.  NECK:  Supple.   IMPRESSION:  That of chronic obstructive lung disease with chronic  airflow obstruction, primary emphysematous component, stable at this  time.   PLAN:  Plan is for patient to maintain Spiriva daily and will see the  patient back in return followup in 3 months.     Charlcie Cradle Delford Field, MD, [facility]  Electronically Signed    PEW/MedQ  DD: 12/07/2006  DT: 12/07/2006  Job #: [id number]   cc:   [name], MD, [facility]

## 2010-05-21 NOTE — Assessment & Plan Note (Signed)
[facility]                          [facility]   Ikechukwu, Cerny BEACHER EVERY                      MRN:          [id number]  DATE:02/01/2008                            DOB:          02/09/1940    [name] is stable.  His lung status is followed by Dr. Delford Field and he  is stable.  He has chronic atrial fibrillation.  The rate is controlled.  He is on Coumadin and he is doing well.  He has known coronary artery  disease.  He is not having any chest pain.  His last exercise test was  done in May 2008.  There was suggestion of old inferior scar that was  mild-to-moderate, but there was no wall motion abnormality and no  significant ischemia.  He also had an echo at that time with an ejection  fraction of 55-60% and question of slight decrease in right ventricular  function.   He is doing well.   PAST MEDICAL HISTORY:   ALLERGIES:  PLAVIX, rash (he is on Coumadin at this time).   MEDICATIONS:  See the flow sheet.   REVIEW OF SYSTEMS:  He is not having any GI or GU symptoms.  He has no  headache, fevers, or chills.  There are no skin rashes.  He does  complain of numbness in his feet.  He is diabetic.  It is possible that  some shoes might help.  We will write a prescription for this.   Otherwise, review of systems is negative.   PHYSICAL EXAMINATION:  VITAL SIGNS:  Blood pressure is 127/82.  Heart  rate is 79.  Weight is 244 pounds.  GENERAL:  The patient is oriented to person, time, and place.  Affect is  normal.  HEENT:  No xanthelasma.  He has normal extraocular motion.  NECK:  There are no carotid bruits.  There is no jugular venous  distention.  LUNGS:  Decreased breath sounds.  There is no respiratory distress.  CARDIAC:  An S1 with an S2.  There are no clicks or significant murmurs.  ABDOMEN:  Soft.  He has no significant peripheral edema.   [name] is stable.  Problems are listed on the note of May 11, 2007  and are  listed in the EMR.  Atrial fib is controlled.  Coronary artery  disease is status.  No further workup is needed.     [name], MD, [facility]  Electronically Signed   JDK/MedQ  DD: 02/01/2008  DT: 02/01/2008  Job #: [id number]   cc:   Charlcie Cradle. Delford Field, MD, [facility]  Laural Roes, [place]

## 2010-05-21 NOTE — Assessment & Plan Note (Signed)
[facility]                          [facility]   NAME:[name]                      MRN:          [id number]  DATE:06/26/2006                            DOB:          Jan 24, 1940    [name] is stable today.  We have been reassessing his cardiac  status recently.  He is doing well.  He continues to have some shortness  of breath.  This is chronic.  We did do a chest x-ray on June 18, 2006.  The study shows hyperinflation suggesting obstructive pulmonary disease.  There was no pneumonia.  There was no volume overload.  Heart size was  upper normal.  [name] is back today and I have re-reviewed the  results with him.  I believe that his shortness of breath is primarily  pulmonary at this point.  I have discussed this with him.  I believe  that he may get some help from pulmonary evaluation.  I told him of the  options for pulmonary evaluation and he would like to see Dr. Danise Mina  in our [place] office.  I am sending information to Dr. Delford Field.   PAST MEDICAL HISTORY:   ALLERGIES:  PLAVIX (rash).   MEDICATIONS:  1. Centrum Silver.  2. Metoprolol 50 b.i.d.  3. Mevacor 40.  4. Neurontin 900 mg b.i.d.  5. Omega-3 fish oil.  6. Vitamin C.  7. Coumadin.  8. Metformin.  9. Lasix 80 mg daily.  10.Potassium 20 mEq daily.  11.Lisinopril 10 mg daily.  12.Aspirin 81 mg daily.   OTHER MEDICAL PROBLEMS:  See the extensive list below.   REVIEW OF SYSTEMS:  Today, he looks and feels well.  He has his usual  shortness of breath and otherwise his review of systems is negative.   PHYSICAL EXAM:  Weight is 241 pounds, blood pressure 111/78 with a pulse  of 90.  The patient is oriented to person, time, and place.  Affect is normal.  He is overweight.  He has no xanthelasma.  He has normal extraocular motions.  There are no carotid bruits.  There is no jugular venous distension.  LUNGS:  Distant lung sounds.  There is no  respiratory distress.  CARDIAC:  Reveals an S1 with an S2.  There are no clicks or significant  murmurs.  ABDOMEN:  Obese, but soft.  He has normal bowel sounds.  Today, he has  no significant peripheral edema.   As mentioned above, since the last visit, he had a chest x-ray with the  results mentioned above.  He also has had a stress Cardiolite scan on  Jun 05, 2006.  This suggested old inferior scar that was mild to  moderate, but no marked wall motion abnormality and no significant  ischemia.  His 2D echo also on May 30 showed that there was mild mitral  regurgitation.  He has good overall left ventricular systolic function.  There is mild left ventricular hypertrophy.  There is a question of some  decrease in right ventricular function, but normal right ventricular  size.   PROBLEM:  1.  Chronic exertional dyspnea.  See the discussion above.  We will      refer him to see Dr. Danise Mina in the [facility] office for full      pulmonary evaluation.  2. Atrial fibrillation.  I believe that he has reasonable rate control      at this time.  He is on Coumadin.  3. Coumadin therapy.  4. Normal left ventricular function.  5. History of an myocardial infarction in the past with a TAXUS stent      in 2004.  He has no ischemia at this time.  He does have residual      LAD disease.  6. Hypertension, treated.  7. Diabetes.  8. Hyperlipidemia.  9. Obesity.  He clearly needs to lose weight.  10.Mild chronic peripheral edema that is stable.   I am not changing his meds today.  I plan to see him back in 3 months.  We will arrange for a full consult with Dr. Danise Mina.     Peter Abed, MD, [facility]  Electronically Signed    JDK/MedQ  DD: 06/26/2006  DT: 06/26/2006  Job #: [id number]   cc:   Charlcie Cradle. Delford Field, MD, FCCP  Selinda Flavin

## 2010-05-21 NOTE — Assessment & Plan Note (Signed)
[facility]                          [facility]   [name], [name]                      MRN:          [id number]  DATE:07/07/2008                            DOB:          08/21/1940    [name] is seen for followup.  See my extensive note of June 08, 2008.  At that time, I made plans for the patient to see Dr. Shan Levans for pulmonary followup.  This appointment will be happening in  the next few weeks.  This will be happening in August.  At the same  time, studies were done to be sure that the patient's cardiac status is  stable.  He underwent a 2-D echo on June 20, 2008.  The study was  technically limited but adequate.  There is some left ventricular  hypertrophy.  Ejection fraction was 60% with no obvious focal wall  motion abnormalities.  The right ventricle was mild to moderately  dilated and there was mild reduction in right ventricular function.  There was no significant tricuspid regurgitation and right heart  pressures could not be estimated.  The patient also had a Myoview scan.  This study showed that there was very mild decrease in activity in the  inferior wall.  I felt that there might be slight scar or slight  ischemia, but that this was a borderline call.   In seeing the patient back today, he is feeling about the same.  He is  active.  It has been extremely hot.  He is limited, however, as he  becomes fatigued and short of breath when doing much physical exercise.  He is not having significant chest pain.   PAST MEDICAL HISTORY:   ALLERGIES:  The patient had a rash from PLAVIX in the past.   MEDICATIONS:  1. Vitamins.  2. Aspirin.  3. Lovastatin.  4. Furosemide.  5. Lisinopril.  6. Metoprolol 50 b.i.d.  7. Omega-3 fish oil.  8. Coumadin.  9. Metformin.  10.Potassium.  11.Spiriva.  12.Gabapentin.   OTHER MEDICAL PROBLEMS:  See the extensive list on my note of June 08, 2008.   REVIEW OF  SYSTEMS:  The patient denies fevers, chills, or skin rashes.  He denies any headache, change in vision, change in hearing, cough,  chest pain, GI symptoms, or GU symptoms.  He is not having any major  musculoskeletal problems.  He does have shortness of breath as described  in the HPI.  All other systems are reviewed and are negative.   PHYSICAL EXAMINATION:  VITAL SIGNS:  Blood pressure is 134/78 with a  pulse of 74.  GENERAL:  The patient is overweight.  The patient is oriented to person,  time, and place.  Affect is normal.  HEENT:  No xanthelasma.  He has normal extraocular motion.  There are no  carotid bruits.  There is no jugular venous distention.  LUNGS:  Decreased breath sounds.  CARDIAC:  An S1 with an S2.  There are no clicks or significant murmurs.  ABDOMEN:  Soft, but obese.  There is no significant peripheral  edema.     [name], MD, [facility]  Electronically Signed    JDK/MedQ  DD: 07/07/2008  DT: 07/08/2008  Job #: [id number]   cc:   Charlcie Cradle. Delford Field, MD, [facility]  [facility] [place], [place]

## 2010-05-21 NOTE — Assessment & Plan Note (Signed)
[facility]                             PULMONARY OFFICE NOTE   NAME:[name], [name]                      MRN:          [id number]  DATE:08/24/2006                            DOB:          Oct 30, 1940    SUBJECTIVE:  Mr. Brosnahan is a 70 year old male with history of chronic  obstructive lung disease, chronic dyspnea, primary emphysema, now on  Spiriva, doing better with decreased shortness of breath, decreased  cough, here for followup and pulmonary function testing.  Other  maintenance medicines are unchanged.  He is having a hard time affording  the medication.   OBJECTIVE:  VITAL SIGNS:  Temperature 97.8. Blood pressure 118/68. Pulse  94.  Oxygen saturation 96% on room air.  CHEST:  Showed diminished breath sounds with prolonged expiratory phase.  No wheeze or rhonchi noted.  CARDIAC:  Exam showed a regular rate and rhythm without S3.  Normal S1  and S2.  ABDOMEN:  Soft, nontender.  EXTREMITIES:  Showed no edema or clubbing.  SKIN:  Clear.   LABORATORY DATA:  Pulmonary function test show an FEV1 of 43% predicted,  total lung capacity 104% of predicted, diffusion capacity 56% predicted.  Values are compatible with moderate emphysema and a severe obstructive  defect but is improved compared to previous values.   IMPRESSION:  Chronic obstructive lung disease with primary emphysematous  component, improved overall.   PLAN:  The plan for this patient is to maintain Spiriva as is and  samples were given; refills were called in and we will see the patient  back in return followup in two months.     Charlcie Cradle Delford Field, MD, [facility]  Electronically Signed    PEW/MedQ  DD: 08/24/2006  DT: 08/25/2006  Job #: [id number]   cc:   [name], MD, [facility]

## 2010-05-21 NOTE — Assessment & Plan Note (Signed)
[facility]                          [facility]   [name], [name]                      MRN:          [id number]  DATE:06/08/2008                            DOB:          [birth year]    [name] is here for a followup.  I followed him for his shortness of  breath and atrial fibrillation.  His shortness of breath appears to be  worse at this time.  Atrial fibrillation does not appear to be bothering  him in terms of feeling palpitations.  I followed his Coumadin status  which has been stable.  I followed his coronary artery disease.  He has  a Taxus stent from 2004 and his most recent exercise test was in 2008.  We also followed his lipids.  He is on medications for his lipids.  Also  followed his weight.  He needs to lose weight significantly.  He also  has a history of mild chronic peripheral edema.  He is on furosemide for  this.   The patient is having more problems with shortness of breath.  He meant  it is exertional.  He is not having chest pain.  Historically when he  had his MI in the past, he did have chest discomfort and a sensation of  burping.  Currently, he is not having this symptom.  His symptom is  exertional shortness of breath.  He does not have PND or orthopnea.  There has not been a major change in his weight.  He does not have  significant swelling.  The patient also mentions that at times he feels  a gurgling sensation in his throat.  He does cough at times and brings  up a whitish sputum.  As outlined above.  He does not have PND or  orthopnea.   Specifically, he has felt short of breath when walking or when mowing  his yard.  He has not had any syncope or presyncope.   ALLERGIES:  He has had a rash from PLAVIX in the past.   MEDICATIONS:  Vitamins, aspirin, lovastatin, furosemide 80, lisinopril  10, metoprolol 50 b.i.d., omega 3, Coumadin, metformin, potassium,  Spiriva, and gabapentin.   OTHER  MEDICAL PROBLEMS:  See the complete list below.   REVIEW OF SYSTEMS:  He has no fevers or chills.  There are no skin  rashes at this time.  He has no headaches.  There is no change in his  vision or his hearing.  He notices a workup.  There is no chest pain.  He has no GI or GU symptoms.  There are no musculoskeletal symptoms.  He  has no gait disturbance.  He is not having any swelling.  He has no  polyuria or polydipsia.  All other systems are reviewed and are  negative.  All other systems are reviewed and are negative.   PHYSICAL EXAMINATION:  The patient is quite stable.  His blood pressure  is 127/82 with a pulse of 83.  His weight is 241 pounds which is  actually down 2 pounds since his prior visit.  He  is overweight.  He is  quite stable to me.  He has no skin rashes.  HEENT reveals no  xanthelasma.  He has normal extraocular motion.  There are no carotid  bruits.  There is no jugular venous distention.  Lungs are distant.  There is no respiratory distress.  Cardiac exam reveals an S1 with an  S2.  The rhythm is irregularly irregular.  His heart sounds also are  distant.  The abdomen is protuberant but soft.  He has trace peripheral  edema.  There are no major musculoskeletal deformities.   EKG is done.  It reveals atrial fibrillation with a controlled  ventricular response.   PROBLEMS:  1. Significant chronic obstructive pulmonary disease with emphysema.      I suspect that some of his current symptoms are pulmonary in      origin.  It is time for him to see Dr. Delford Patton and we will make sure      that this arrangement is made in the near future.  2. Atrial fibrillation.  The rate is controlled.  I will consider      having him wear a Holter monitor to see if marked increase in heart      rate is causing his symptoms.  I have chosen not to arrange for      this yet.  3. Coumadin therapy.  His Coumadin is carefully continued for his      atrial fibrillation.  4. History of  normal left ventricular function.  His last echo was      over 2 years ago.  He does need a followup echo so that we can      reassess his LV function.  5. History of coronary artery disease.  He had an MI with a Taxus      stent in 2004.  He also had residual LAD disease.  His last study      was done in 2008 with no marked ischemia.  He tolerated adenosine      at that time.  We will do a Lexiscan Myoview to reassess him at      this time.  6. Diabetes, treated.  7. Hyperlipidemia, treated.  8. Obesity.  He knows that he needs to lose weight.  However, his      weight has not changed recently.  9. History of mild chronic peripheral edema.  This is the same.  He is      on his diuretics.  10.New and worsening problem of increasing shortness of breath.   From his problems listed above, he certainly has many reasons for  increased shortness of breath.  We need to be sure that he is not having  significant ischemia and this will be ruled out.  We need to be sure  that he has not had change in his LV function.  He also needs some  screening labs.  However, he did have labs in February 2010 with a BUN  of 15 and a creatinine of 1.1.  His hemoglobin at that time was 15.  I  feel we do not need to repeat a BMET.  We do need to check a TSH.  Studies will be arranged, and I will see him back for followup.  As  mentioned an EKG was done.  I have reviewed it personally.  There is no  QRS change.  There is atrial fibrillation with a controlled rate.     Peter Abed, MD,  [facility]  Electronically Signed    JDK/MedQ  DD: 06/08/2008  DT: 06/09/2008  Job #: [id number]   cc:   Peter Rein, PA

## 2010-05-21 NOTE — Assessment & Plan Note (Signed)
[facility]                          [facility]   [name], [name] [name]                      MRN:          [id number]  DATE:05/25/2006                            DOB:          [birth year]    PRIMARY CARDIOLOGIST:  Dr. [name].   REASON FOR VISIT:  Scheduled followup.   Since last seen here in the clinic in December 2007 by Dr. Willa Rough,  the patient continues to report chronic exertional dyspnea but with no  recent exacerbation.  He denies PND, orthopnea and reports significant  improvement of his chronic peripheral edema since being placed on Lasix  80 daily, as per Dr. Willa Rough.   The patient's cardiac history notable for coronary artery disease and  permanent atrial fibrillation.  He has not had any ischemic workup since  undergoing TAXUS stenting of a high-grade mid RCA lesion in November  2004, following presentation with non-ST elevation myocardial  infarction.  Residual anatomy at that time showed non-critical LAD and  CFX disease, with normal left ventricular function.   A 2-D echo in May 2007 revealed normal LVF (ejection fraction 55%-60%)  with question of mild anteroseptal hypokinesis; mild mitral  regurgitation.   The patient's essential complaint is that of bilateral lower extremity  and burning and he is being treated for peripheral neuropathy with  Neurontin.   The patient also states that he has had recent blood work, including  fasting lipid profile, but at this date is currently unavailable.   Electrocardiogram today reveals atrial fibrillation at 83 BPM with  normal axis and non-specific ST abnormalities.   CURRENT MEDICATIONS:  1. Coumadin 5 mg as directed.  2. Full dose aspirin.  3. Metoprolol 50 mg b.i.d.  4. Lisinopril 20 mg daily.  5. Mevacor 40 mg daily.  6. Neurontin 900 mg b.i.d.  7. Metformin 500 mg daily.  8. Lasix 80 mg daily.  9. KCl 20 daily.   PHYSICAL EXAMINATION:   Blood pressure 100/60 right; 86/57 left, pulse  80s, irregular, weight 243 (down 2 pounds).  GENERAL:  A 70 year old male, obese, sitting upright no distress.  HEENT:  Normocephalic, atraumatic.  NECK:  Palpable bilateral carotid pulses without bruits; unable to  assess JVD secondary to neck girth.  LUNGS:  Diminished breath sounds throughout, but with no crackles or  wheezes.  HEART:  Irregularly irregular (S1, S2) with diminished heart sounds.  ABDOMEN:  Protuberant, nontender.  EXTREMITIES:  Palpable bilateral femoral pulses without bruits;  nonpalpable distal pulses with 1+ pedal edema.  NEURO:  No focal deficit.   IMPRESSION:  1. Chronic exertional dyspnea.  2. Permanent atrial fibrillation.      a.     Rate controlled.      b.     Chronic Coumadin.  3. Normal left ventricular function.  4. Coronary artery disease.      a.     Non-ST elevation myocardial infarction/TAXUS stenting right       coronary artery, November 2004.  5. Hypotension.  6. Type 2 diabetes mellitus.  7. Hyperlipidemia.  8. Obesity.  9.  Chronic peripheral edema.   PLAN:  1. Repeat 2-D echo for reassessment of left ventricular function.  2. Schedule adenosine stress Cardiolite to exclude development of      occult ischemia.  Of note, the patient did have residual moderate      diffuse LAD disease in 2004 and a followup stress Cardiolite in 6      months was recommended at that time for reassessment of the LAD.  3. Request recent blood work data, including lipid profile.      Aggressive lipid management is recommended with target LDL goal of      70 or less.  4. Decrease lisinopril to 10 mg daily, in light of development of      hypotension.  5. Decrease aspirin from full dose to 81 mg daily, given the patient      is on chronic Coumadin.  6. Schedule return clinic followup with myself and Dr. Willa Rough in      the following 4-6 weeks.      Rozell Searing, PA-C  Electronically Signed       [name], MD, [facility]  Electronically Signed   GS/MedQ  DD: 05/25/2006  DT: 05/25/2006  Job #: [id number]   cc:   Laural Roes PA-C

## 2010-05-21 NOTE — Assessment & Plan Note (Signed)
[facility]                          [facility]   [name], [name] [name]                      MRN:          [id number]  DATE:09/18/2006                            DOB:          [birth year]    [name] is seen for cardiology followup.  I had seen him last in the  office on June 26, 2006 and he was doing well.  At that time, we  discussed pulmonary evaluation and he wanted to be seen in [place],  and he has been seen by Dr. Shan Levans of our group.  He has been  placed on Spiriva and, in fact, he is beginning to feel better, and  overall, he is doing and looking better in general.  He has not been  having any chest pain.  He has not been having any signs of syncope or  pre-syncope.   PAST MEDICAL HISTORY:   ALLERGIES:  He had a rash from PLAVIX.   MEDICATIONS:  1. Multivitamin.  2. Metoprolol.  3. Mevacor.  4. Neurontin.  5. Omega-3 fish oil.  6. Coumadin.  7. Lasix 80.  8. KCl.  9. Lisinopril 10.  10.Aspirin 81.  11.Spiriva.   OTHER MEDICAL PROBLEMS:  See the list below.   REVIEW OF SYSTEMS:  He still has shortness of breath but he definitely  is beginning to improve.  Otherwise, the review of systems is negative.   PHYSICAL EXAM:  Weight is 244 pounds.  He and I discussed trying to lose  weight as it is very important for him.  Blood pressure is 107/78 with a  pulse of 86.  The patient is oriented to person, time, and place.  Affect is normal.  HEENT:  Reveals no xanthelasma.  He has normal extraocular motions.  There are no carotid bruits.  There is no jugular venous distension.  LUNGS:  Reveal decreased breath sounds.  CARDIAC:  Reveals an S1 with an S2.  There are no clicks or significant  murmurs.  ABDOMEN:  Obese, but soft.  He has normal bowel sounds.  He has trace peripheral edema.   No labs are done today.   PROBLEM LIST:  1. Chronic exertional dyspnea.  He is now being seen and followed by  Dr. Danise Mina.  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  This is a significant issue for      him.  2. Atrial fibrillation.  His rate is controlled and he is on Coumadin.  3. Coumadin therapy.  4. Normal left ventricular function.  5. History of an myocardial infarction with a TAXUS stent in 2004.  He      does have residual left anterior descending disease.  Being      aggressive with his lipids would be very helpful.  Unfortunately,      we have only been able to get him on Mevacor.  He is on aspirin and      he is on a beta blocker.  He is also on an ACE inhibitor.  6. Diabetes.  7.  Hyperlipidemia.  8. Obesity.  9. Mild chronic peripheral edema that is stable.  He does not need any      further diuresis.   He is stable here today and I have not changed his medicines.     [name], MD, [facility]  Electronically Signed    JDK/MedQ  DD: 09/18/2006  DT: 09/18/2006  Job #: [id number]   cc:   Charlcie Cradle. Delford Field, MD, FCCP  Selinda Flavin

## 2010-05-21 NOTE — Assessment & Plan Note (Signed)
[facility]                          [facility]   NAME:Nesbit, OIVA DIBARI                      MRN:          [id number]  DATE:07/07/2008                            DOB:          August 04, 1940    Labs were done as described in the HPI.  The patient had an echo and a  nuclear scan.  It is of note that I feel he does not have any marked  ischemia.  He has good LV function.  He does have right ventricular  dysfunction.  I could not estimate his right heart pressures.   Problems are listed extensively on my note of June 08, 2008.  Overall,  the patient is stable.  He has significant lung disease.  He will be  seeing Dr. Delford Field in August.  I am hopeful that there may be further  interventions to help with his respiratory status.  I have chosen not to  push for further rate control at this time.  It can still be considered  that he can wear a Holter to be sure that his heart rate does not  increase too much with exercise.  I will see him back for cardiology  followup.     [name], MD, [facility]  Electronically Signed    JDK/MedQ  DD: 07/07/2008  DT: 07/08/2008  Job #: [id number]   cc:   Charlcie Cradle. Delford Field, MD, [facility]  Oran Rein, [place]

## 2010-05-24 NOTE — Assessment & Plan Note (Signed)
[facility]                            [facility]   [name], [name] DESHANE COTRONEO                      MRN:          [id number]  DATE:09/30/2005                            DOB:          Apr 10, 1940    Mr. Struckman is seen today for followup.  See my complete note of September 25, 2005, and my note of September 11, 2005.  When I saw him on September 20,  I felt that he was still volume overloaded.  I pushed his Lasix up to 80 mg  a day.  We had a good discussion about watching his salt and total fluid  intake.  He has diuresed.  His legs are clearly improved.  His breathing is  improving and he feels better.  BMET on September 20, revealed a BUN of 10  and creatinine 1.1.  His potassium was down to 3.5.   He is feeling well in general.   ALLERGIES:  BEE STINGS.   MEDICATIONS:  1. Vitamins.  2. Aspirin 325.  3. Metoprolol 50 b.i.d.  4. Lisinopril 40.  5. Mevacor 40.  6. Neurontin.  7. Omega-3 fish oils.  8. Coumadin.  9. Metformin.  10.Lasix 80 mg daily.   PAST MEDICAL HISTORY:  For other medical problems, see the complete list of  September 11, 2005.   REVIEW OF SYSTEMS:  He is feeling well and he has no significant complaints.   PHYSICAL EXAMINATION:  VITAL SIGNS:  Blood pressure is on the low side.  Today, it is 98/52.  His blood pressure has run low in general, however, we  will decrease his lisinopril dose down to 20 mg daily from 40 mg daily.  LUNGS:  Clear.  Respiratory effort is not labored.  HEENT:  No xanthelasma.  He has normal extraocular motion.  There are no  carotid bruits.  There is no jugular venous distention.  CARDIAC:  S1, S2.  There are no clicks or significant murmurs.  ABDOMEN:  Soft, but obese.  EXTREMITIES:  His peripheral edema is now gone other than the possibility of  trace edema.  His legs look much better.   Problems are listed completely on my note of September 11, 2005.   ASSESSMENT/PLAN:  1.  Atrial fibrillation.  For now, we will continue to watch his rate and      keep him on Coumadin and I will address the issue further later.  2. Volume overload.  He clearly is improved.  He will have a BMET today to      be sure that this renal function is stable.  He will stay on 80 of      Lasix and will see him back next week.  He is aware that I will not see      him next week, but our team will and I will see him the week      afterwards.  We need to be sure that this renal function stays stable      and to be sure about whether we should continue 80 of Lasix or cut  back      to 40.  3. Shortness of breath.  This is clearly improving.  4. Relative hypotension.  We will cut his lisinopril down to 20.  5. Potassium of 3.5.  We will give him 40 mEq of KCl today and then 20 mEq      once a day until we see him back.                                   [name], MD, [facility]   JDK/MedQ  DD:  09/30/2005  DT:  10/02/2005  Job #:  [id number]

## 2010-05-24 NOTE — Assessment & Plan Note (Signed)
[facility]                            [facility]   NAME:[name], [name]                      MRN:          [id number]  DATE:10/17/2005                            DOB:          [birth year]    I saw Mr. Bolls last on September 30, 2005.  I arranged for him to see Dr.  Andee Lineman on October 10, 2005 to be sure that the medication changes we had made  were continuing to work.  He did well that day and there is a dictated note  from Dr. Andee Lineman.  I am now seeing Mr. Sann back today for further  decision-making about his volume status and his atrial fibrillation.  The  patient has atrial fibrillation.  We believe that it has occurred within the  past year.  We do not know exactly when.  He has been short of breath with  volume overload.  With careful attention to decreasing salt intake and fluid  intake, and by taking 80 of Lasix daily, his edema is markedly decreased and  he is doing well.  His renal status is stable.  His potassium was low and it  has been treated.   PAST MEDICAL HISTORY:  Allergies, bee stings.   MEDICATIONS:  1. Multivitamin.  2. Aspirin.  3. Metoprolol 50 b.i.d.  4. Lisinopril 20.  5. Mevacor 40.  6. Neurontin.  7. Omega 3 fish oil.  8. Coumadin.  9. Metformin.  10.Lasix 80 mg daily.  11.KCl 20 mEq daily.   OTHER MEDICAL PROBLEMS:  See the complete list below.   REVIEW OF SYSTEMS:  He is doing well.  His breathing is better and he feels  better.  He does not feel any palpitations.  His review of systems is  negative.   PHYSICAL EXAM:  The patient is overweight.  He is over nourished.  He is  oriented to person, time, and place, and his affect is normal.  HEENT:  No xanthelasma.  He has normal extraocular motion.  Blood pressure today is 98/62.  His weight is 242 pounds, and his heart rate  is 60.  There is no jugular venous distension.  There are no carotid bruits.  LUNGS:  Clear.  Respiratory effort  is not labored.  His lung sounds are  distant.  CARDIAC:  His heart sounds are also distant.  I cannot be sure about the  regularity of his heart rate at this time.  There is no indication to do an  EKG today.  The patient has trace peripheral edema.  He has no major  musculoskeletal deformities.   No labs are done today.   PROBLEMS:  1. Atrial fibrillation.  His rate is controlled.  We will do an EKG when I      see him back next time.  I had an extensive discussion with the patient      about the pros and cons of proceeding with cardioversion.  I made it      clear to him that, in his case, I feel that it would probably be a good  idea to see if we can convert him back to sinus on one occasion.  After      full discussion, however, he is feeling well and he would prefer to      consider this over time.  He asked me if this would be acceptable and I      said of course it would as I feel that the indication for cardioversion      in his case at this time is borderline.  He may not hold.  2. Volume overload.  He is doing much better and feeling much better.  3. Shortness of breath, improved.  4. Relative hypotension.  He is tolerating his blood pressure well.  5. Low potassium.  He is on potassium replacement.   Mr. Burkitt is stable.  We had a long discussion about the issue of atrial  fib.  For now, we will continue to treat him with rate control and I will  see him back in 6 weeks.            ______________________________  [name], MD, [facility]     JDK/MedQ  DD:  10/17/2005  DT:  10/19/2005  Job #:  [id number]

## 2010-05-24 NOTE — Assessment & Plan Note (Signed)
[facility]                            [facility]   Crecencio, Kwiatek TERESO UNANGST                      MRN:          [id number]  DATE:10/10/2005                            DOB:          1940-05-06    REFERRING PHYSICIAN:  [name], MD, [facility]   HISTORY OF PRESENT ILLNESS:  The patient is a 70 year old male who was seen  by Dr. Myrtis Ser a week ago.  The patient is known to be in atrial fibrillation  and has chronic volume overload.  He was diuresed and Lasix was increased to  80 mg a day.  Dr. Myrtis Ser asked me to see the patient in follow-up to make sure  that he remains stable.  His creatinine done when Dr. Myrtis Ser was here was  stable at 1.2.  The patient feels much better.  He has noticed that his leg  edema has all but resolved.  He has no shortness of breath on exertion.  Denies any chest pain.   MEDICATIONS:  1. Centrum Silver.  2. Enteric coated aspirin 325 daily.  3. Metoprolol 50 twice daily.  4. Lisinopril 20 mg by mouth daily.  5. Mevacor 40 mg a day.  6. Neurontin.  7. Omega fish oil.  8. Vitamin C.  9. Coumadin as directed.  10.Metformin 5 mg a day.  11.Lasix 80 mg a day.  12.Potassium.   PHYSICAL EXAMINATION:  VITAL SIGNS:  Blood pressure 100/60, heart rate 74  beats per minute, weight 242.  NECK:  Normal carotid upstroke, no carotid bruits.  LUNGS:  Clear breath sounds bilaterally.  HEART:  Irregular rate and rhythm, normal sinus.  ABDOMEN:  Soft.  EXTREMITIES:  Trace pitting edema.   PROBLEM LIST:  1. Atrial fibrillation.  Dr. Myrtis Ser will address this on his next clinic      visit.  He is on Coumadin.  2. Volume overload.  Patient's volume overload is markedly improved.  He      can continue on      Lasix for now as his creatinine remains stable.  His potassium is also      stable.  3. The patient will follow up with Dr. Myrtis Ser next week.       Learta Codding, MD,FACC     GED/MedQ  DD:  10/10/2005  DT:   10/13/2005  Job #:  [id number]   cc:   [name], MD, [facility]  Selinda Flavin

## 2010-05-24 NOTE — Assessment & Plan Note (Signed)
[facility]                            [facility]   [name], [name]                      MRN:          [id number]  DATE:09/25/2005                            DOB:          1940/03/01    [name] returns for follow up.  See my complete note of September 11, 2005.  At that time, I felt that he was volume overloaded.  I pushed his  Lasix up to 40 twice a day for three days and then 40 once a day.  He had  significant diuresis at first and then it tailed off.  I believe he is still  volume overloaded.  His breathing is somewhat better.   ALLERGIES:  Bee stings.   MEDICATIONS:  See the prior note, other than the change of Lasix up to 40 mg  once a day.   PAST MEDICAL HISTORY:  See the complete list in my note of September 11, 2005, for numbers 1 through 10.   REVIEW OF SYSTEMS:  He still has shortness of breath but this is somewhat  better.  His review of systems, otherwise, is negative.   PHYSICAL EXAMINATION:  VITAL SIGNS:  Blood pressure today is 100/62.  His pulse is in the 85 range.  The rhythm is irregularly irregular.  LUNGS:  Reveal no rales.  He does have distant lung sounds.  HEENT:  No xanthelasma, he has normal extraocular motion.  NECK:  There are no carotid bruits.  There is no jugular venous distention.  CARDIAC:  S1 and S2.  There are no clicks or significant murmurs.  ABDOMEN:  Soft.  EXTREMITIES:  The patient still has 1+ peripheral edema.  His weight has  decreased by 3-4 pounds since his prior visit.   EKG does show that he remains in atrial fibrillation and his rate is  relatively well controlled at this time.   PROBLEMS:  As listed numbers 1 through 10 on my note of September 11, 2005.  1. Atrial fibrillation - I will continue to follow this per my prior      discussion and consider it further over time.  2. Volume overload - we will push his Lasix up to 80 mg once a day and see      him next week.   BMP is to be checked today.  3. Shortness of breath - I believe this is related to volume overload.      When his volume is      stable, we will then consider further evaluation including possibly      pulmonary workup and the possibility of whether or not we will try to      convert him to sinus rhythm.                                   [name], MD, [facility]   JDK/MedQ  DD:  09/25/2005  DT:  09/26/2005  Job #:  [id number]

## 2010-05-24 NOTE — Discharge Summary (Signed)
NAME:  KRIS, NO NO.:  [id number]   MEDICAL RECORD NO.:  [id number]                   PATIENT TYPE:  INP   LOCATION:  2041                                 FACILITY:  [facility]   PHYSICIAN:  Jesse Sans. Wall, M.D.                DATE OF BIRTH:  June 15, 1940   DATE OF ADMISSION:  12/02/2002  DATE OF DISCHARGE:  12/04/2002                                 DISCHARGE SUMMARY   DISCHARGE DIAGNOSES:  1. Non-ST elevated myocardial infarction.  2. Hyperlipidemia, treated.  3. Hypertension, treated.  4. Remote history of tobacco use.  5. Family history of coronary artery disease.   HOSPITAL COURSE:  Mr. Rathje is a 70 year old male patient who presented to  [facility] on December 02, 2002 after waking from sleep the Wednesday  prior with chest pain.  He thought it was indigestion, took Tums, and it  resolved after two hours.  He had recurrent symptoms on Thanksgiving that  started after dinner.  His pain did not resolve with Tums and then he  presented to the emergency room at [facility].  He subsequently  ruled in for myocardial infarction and [facility] was asked to  further evaluate the patient.   His EKG at that time revealed normal sinus rhythm with inferolateral ST  segment depression.  The patient was somewhat hesitant for transfer for  urgent cardiac catheterization; however, he eventually agreed.   Upon catheterization, he was found to have the following:  A proximal 30%  RCA stenosis, followed by 50% and a 95% stenosis.  This is angioplastied and  then a Taxus Express II stent was placed reducing the 50% and 95% lesions to  a less than 10% lesion postprocedure.  Circumflex had multiple 40% and 50%  lesions extending into the marginals. The LAD had a proximal 20% followed by  mid-60%.  The first diagonal had a 60% proximal lesion.   The patient will need an exercise stress Cardiolite in six weeks to assess  the LAD lesion to  assure no underlying ischemia.  He will need to remain on  Plavix for nine months.   LABORATORY DATA:  The patient's labs include BUN 10, creatinine 0.9, sodium  137, potassium 4.3.  Hemoglobin 14.5, hematocrit 42.6, platelets 236,000,  white count 8.0.   His EKG on December 03, 2002 revealed normal sinus rhythm, rate 65, with  small Q-waves in the inferior leads with T-wave inversion in the inferior  leads.   DISCHARGE MEDICATIONS:  1. Plavix 75 mg q.d. for nine months.  2. Aspirin 325 mg q.d. indefinitely.  3. Lisinopril 20 mg q.d.  4. Lopressor 25 mg b.i.d.  5. Lipitor 80 mg q.d.  6. Sublingual nitroglycerin p.r.n. chest pain.   DISCHARGE INSTRUCTIONS:  No strenuous activity or lifting over five pounds  or driving for two weeks.  Not to return to work until seen by  Dr. Jonelle Sidle.  Remain on a low fat, low salt diet.  Clean over right groin with  soap and water.  No scrubbing.  Call with any problems or concerns.  He is  to follow up with cardiac rehabilitation.  The office will set up the  appointment and contact Mr. Sudol for his appointment date and time.  At  that point, he will need to have the stress test set up and we will need to  go ahead and follow and set up an appointment for lipids.      Guy Franco, P.A. [facility]                      Thomas C. Wall, M.D.    LB/MEDQ  D:  12/04/2002  T:  12/04/2002  Job:  [id number]   cc:   [facility]  [address], [place], [place]   Ruthell Rummage, M.D.

## 2010-05-24 NOTE — Assessment & Plan Note (Signed)
[facility]                          [facility] NOTE   NAME:Dirr, MOE GRACA                      MRN:          [id number]  DATE:12/12/2005                            DOB:          04/29/1940    I saw Mr. Vivanco last on 10/17/2005, see my extensive note. He has been  stable. In fact, his breathing is somewhat better. His atrial  fibrillation rate is controlled, and he is feeling relatively well. He  questions whether he could have some slight increased edema. His weight  is 245 pounds, which on our scale is increased a few pounds. He says  that he is being careful with his salt and fluid intake. However, there  is some confusion about the dose of diuretics that he is on. I thought  he was on 80 mg of Lasix. He is not sure from his current list and he  will call us back today. I definitely want him on 80 mg a day.   PAST MEDICAL HISTORY:   ALLERGIES:  He has had a rash from PLAVIX.   MEDICATIONS:  1. Multivitamin.  2. Aspirin.  3. Metoprolol.  4. Lisinopril, question dose (to be verified).  5. Mevacor.  6. Neurontin.  7. Omega 3 fish oil.  8. Coumadin.  9. Metformin.  10.Lasix, dose to be determined.  11.KCl 20 mEq daily.   OTHER MEDICAL PROBLEMS:  See the complete list on my note of 10/17/2005.   REVIEW OF SYSTEMS:  Other than a slight increase in edema, he is feeling  well, and he, in fact, says that his shortness of breath is better than  it has been. Otherwise, his review of systems is negative.   PHYSICAL EXAMINATION:  GENERAL: The patient is oriented to person, time  and place. His affect is normal.  HEENT: Reveals no xanthelasma.  He has normal extraocular motion.  There is no jugular venous distension. There are no carotid bruits.  CARDIAC: Reveals an S1 with an S2.  LUNGS: Clear. Respiratory effort is not labored.  ABDOMEN: Soft.  The patient does have 1+ peripheral edema.   Problems are listed in my note of  10/17/2005. I do believe his volume is  mildly increased. We will check to be sure that he is on 80 mg of Lasix.  He is feeling well and I will not push it higher than 80 mg at this  time. I will see him back for followup in approximately 8 weeks.     [name], MD, [facility]  Electronically Signed    JDK/MedQ  DD: 12/12/2005  DT: 12/13/2005  Job #: [id number]

## 2010-05-24 NOTE — Assessment & Plan Note (Signed)
[facility]                            [facility] NOTE   NAME:[name], [name]                      MRN:          [id number]  DATE:                                      DOB:          [birth year]    [name] is seen for cardiology followup.  He was seen in the office on  05/22/05.  There is a written note, but no dictated note from that visit.  At that time 2D echo was ordered, and the patient's ejection fraction is 55  to 60%.  He has aortic sclerosis but no stenosis.  Coumadin was started  because he had atrial fibrillation at that time.  He is back for followup at  this time.   ALLERGIES:  BEE STINGS.   MEDICATIONS:  1. Aspirin 325 mg.  2. Metoprolol 50 mg b.i.d.  3. Lisinopril 40 mg.  4. Mevacor 40 mg.  5. Lasix 20 mg daily (dose to be increased).  6. Neurontin 900 mg b.i.d.  7. Fish oil.  8. Coumadin.  9. Metformin.   OTHER MEDICAL PROBLEMS:  See the list below.   REVIEW OF SYSTEMS:  The patient does have some edema and shortness of  breath, and he is volume overloaded.  Otherwise his review of systems is  negative.   PHYSICAL EXAMINATION:  VITAL SIGNS:  The blood pressure is 110/74.  His rate  is 80.  GENERAL:  The patient is oriented to person, time and place, and his affect  is normal.  LUNGS:  Clear.  Respiratory effort is not labored.  HEENT:  Reveals no xanthelasma.  He has normal extraocular motion.  CARDIAC:  Exam reveals an S1 with an S2. There are no clicks or significant  murmurs.  ABDOMEN:  Obese.  There are no masses or bruits.  EXTREMITIES:  He does have 1 to 2+ peripheral edema.   EKG reveals his atrial fibrillation.  The rate is under borderline control  at this time at rest.   PROBLEM LIST:  1. History of ALLERGY TO BEE STINGS.  2. Coronary disease, status post intervention at two sites in the past.  3. History of a rash secondary to Plavix.  He tolerated Ticlid when      needed.  4. History of  hypertension.  5. Hyperlipidemia.  6. History of remote tobacco use.  7. Bilateral foot pain.  No vascular proof.  He is treated for neurologic      pain by Dr. Orlin Hilding in [place].  8. Atrial fibrillation.  It is my understanding this probably started some      time this year.  There has been no discussion up to this point of      cardioversion.  9. Volume overload. We will push his Lasix dose up to 40 mg b.i.d. for two      days, and then 40 mg once a day, to try to diurese him.  We will then      see him back in followup.  I am hoping this will help him  with his      shortness of breath.  10.Shortness of breath.  I am hoping this is due to volume overload.      After his volume status is stabilized we will look further into the      thought process about his atrial fibrillation, and whether or not      cardioversion is appropriate at some point.                                   [name], MD, [facility]   JDK/MedQ  DD:  09/11/2005  DT:  09/11/2005  Job #:  [id number]

## 2010-05-24 NOTE — Cardiovascular Report (Signed)
NAME:  [name], [name] NO.:  [id number]   MEDICAL RECORD NO.:  [id number]                   PATIENT TYPE:  INP   LOCATION:  2912                                 FACILITY:  [facility]   PHYSICIAN:  Carole Binning, M.D. [facility]         DATE OF BIRTH:  1940/02/05   DATE OF PROCEDURE:  12/02/2002  DATE OF DISCHARGE:                              CARDIAC CATHETERIZATION   PROCEDURE PERFORMED:  1. Left heart catheterization with coronary angiography and left     ventriculography.  2. Percutaneous transluminal coronary angioplasty with placement of a drug-     eluting stent in the mid right coronary artery.   INDICATION:  Mr. Wilms is a 70 year old male admitted to [facility]  with chest pain.  He ruled in for a non-Q-wave myocardial infarction and was  transferred for cardiac catheterization. Harlon Flor is a 70 year old woman  with history of prior bypass surgery.  She presented to the hospital with  symptoms of chest pain worrisome for unstable angina and was referred for  cardiac catheterization.   PROCEDURAL NOTE:  A 6 French sheath was placed in the right femoral artery.  Coronary angiography was performed with 6 [place] was placed in the right  femoral artery.  Coronary angiography was performed with 6 [place] JR-4 and  JL-4 catheters.  Left ventriculography was performed with an angled pigtail  catheter.  Contrast was Omnipaque.  There were no complications.   RESULTS:   HEMODYNAMICS:  1. Left ventricular pressure 130/20.  2. Aortic pressure 130/100.  3. There is no aortic valve gradient.   LEFT VENTRICULOGRAM:  There is mild hypokinesis of the inferior wall,  otherwise normal wall motion.  Ejection fraction is estimated at greater  than or equal to 60%.  There is trace mitral regurgitation.   CORONARY ARTERIOGRAPHY (RIGHT DOMINANT):  Left main is normal.   Left anterior descending artery has a 20% stenosis in the ostium.  The mid  LAD has a  diffuse 60% stenosis.  The LAD gives rise to a large diagonal  branch which has a 60% stenosis at its origin.   Left circumflex has a 40% stenosis at its ostium.  The circumflex gives rise  to a small OM-1, a large OM-2 and a small OM-3.  The OM-2 has a 40%  stenosis.   Right coronary artery is a dominant vessel.  There is a tubular 30% stenosis  in the proximal vessel.  In the mid right coronary artery there is a 50%  stenosis followed by 95% stenosis with haziness and possible thrombus.  The  distal right coronary artery gives rise to a large posterior descending  artery and a large posterior lateral branch.  There is a diffuse 30%  stenosis in the posterior descending artery.   IMPRESSION:  1. Preserved left ventricular systolic function.  2. Two-vessel coronary artery disease.  The culprit is a 95% stenosis in the  mid right coronary artery with haziness and possible thrombus.  There is     moderate disease in the left anterior descending artery with borderline     severity.   PLAN:  Percutaneous intervention to the right coronary artery.  See below.   PERCUTANEOUS TRANSLUMINAL CORONARY ANGIOPLASTY NOTE:  Following completion  of the diagnostic catheterization, we proceeded with percutaneous coronary  intervention.  We utilized the pre-existing  6-French sheath in the right  femoral artery.  The patient had been pretreated with heparin and  integrilin.  One additional bolus of integrilin was administered in the  catheterization  laboratory.  We used a 6 [place] JR-4 guiding catheter.  An  IQ coronary guide wire was advanced under fluoroscopic guidance into the  distal portion of the right coronary artery. A 3.0 x 15-mm Quantum balloon  was positioned across the lesion and two inflations were performed; each to  12 atmospheres.  Despite these two balloon inflations, there remained a 95%  stenosis in the mid right coronary artery.  We therefore positioned a 3.0 x  24-mm Taxus  drug-eluting stent across the diseased segment of vessel in the  mid right coronary artery and deployed this stent at 12 atmospheres.  We  then went back with a 3.25 x 15-mm Quantum balloon and positioned this in  the distal aspect of the stent inflating it to 20 atmospheres in the  proximal aspect of the stent inflating it to 14 atmospheres.  We then went  back in with a 3.5 x 15-mm Quantum balloon positioning it in the distal  aspect of the stent inflating it to 16 atmospheres.  Final angiographic  images were obtained revealing patency of the right coronary artery with  less than 10% residual stenosis and TIMI-3 flow.   COMPLICATIONS:  None.   RESULTS:  Successful percutaneous transluminal coronary angioplasty with  placement of a drug-eluting stent in the mid right coronary artery.  A 50%  followed by 95% stenosis was reduced to less than 10% residual with  TIMI-3  flow.   PLAN:  Integrilin will be continued for 24 hours.  It is recommended that  aspirin and Plavix combination be administered for nine months followed by  lifelong aspirin therapy.  I would recommend a stress Cardiolite in  approximately six months to assess for significant residual disease in the  left anterior descending artery.                                               Carole Binning, M.D. [facility]    MWP/MEDQ  D:  12/02/2002  T:  12/02/2002  Job:  [id number]   cc:   [facility] in [place]

## 2010-05-31 ENCOUNTER — Ambulatory Visit (INDEPENDENT_AMBULATORY_CARE_PROVIDER_SITE_OTHER): Payer: Medicare Other | Admitting: *Deleted

## 2010-05-31 DIAGNOSIS — Z7901 Long term (current) use of anticoagulants: Secondary | ICD-10-CM

## 2010-05-31 DIAGNOSIS — I4891 Unspecified atrial fibrillation: Secondary | ICD-10-CM

## 2010-05-31 LAB — POCT INR: INR: 2.5

## 2010-06-05 ENCOUNTER — Ambulatory Visit (INDEPENDENT_AMBULATORY_CARE_PROVIDER_SITE_OTHER): Payer: Medicare Other | Admitting: Critical Care Medicine

## 2010-06-05 ENCOUNTER — Encounter: Payer: Self-pay | Admitting: Critical Care Medicine

## 2010-06-05 VITALS — BP 130/80 | HR 98 | Temp 97.8°F | Ht 70.0 in | Wt 244.6 lb

## 2010-06-05 DIAGNOSIS — J449 Chronic obstructive pulmonary disease, unspecified: Secondary | ICD-10-CM

## 2010-06-05 DIAGNOSIS — J4489 Other specified chronic obstructive pulmonary disease: Secondary | ICD-10-CM

## 2010-06-05 MED ORDER — ALBUTEROL SULFATE HFA 108 (90 BASE) MCG/ACT IN AERS: 2.0000 | INHALATION_SPRAY | Freq: Four times a day (QID) | RESPIRATORY_TRACT | Status: DC | PRN

## 2010-06-05 NOTE — Patient Instructions (Addendum)
No change in medications. Use Ventolin inhaler as needed 1-2 puffs every 4 hours for shortness of breath Return in        4 months

## 2010-06-05 NOTE — Progress Notes (Signed)
Subjective:    Patient ID: [name], male    DOB: 1940-12-03, 70 y.o.   MRN: [id number]  HPI [name] is a 70 year old white male, history of chronic obstructive  lung disease, primary emphysematous component  06/05/2010 Notes more dyspnea in the heat.  Now some cough but mucus is white.  No real chest pain .   Notes some wheezing.  No real edema in feet. Pt denies any significant sore throat, nasal congestion or excess secretions, fever, chills, sweats, unintended weight loss, pleurtic or exertional chest pain, orthopnea PND, or leg swelling Pt denies any increase in rescue therapy over baseline, denies waking up needing it or having any early am or nocturnal exacerbations of coughing/wheezing/or dyspnea. Pt also denies any obvious fluctuation in symptoms with  weather or environmental change or other alleviating or aggravating factors  Past Medical History  Diagnosis Date  . COPD with emphysema     chronic Dyspnea, followed by Dr. Dennie Bible. Wright, oxygen at night started 11/2008  . Permanent atrial fibrillation   . Anticoagulant long-term use     Coumadin  . Dilated ventricle     Right Ventricle, mild moderately dilated by Echo 06/2008  . Ventricular dysfunction, right     RV pressure could not be estimated with no TR  . CAD (coronary artery disease)     .MI with a Taxus stent in 2004..  /   nuclear... June, 2010.. small scar or slight ischemia inferior wall  . Diabetes mellitus   . Hyperlipemia   . Obesity   . Peripheral edema     Mild  . Hypertension     relative hypotension... August, 2011... Cozaar stopped  . Foot pain     Significant neuropathy has been evaluated at [facility].  . COPD (chronic obstructive pulmonary disease)      History reviewed. No pertinent family history.   History   Social History  . Marital Status: Married    Spouse Name: N/A    Number of Children: N/A  . Years of Education: N/A   Occupational History  . Not on  file.   Social History Main Topics  . Smoking status: Former Smoker -- 2.0 packs/day for 40 years    Types: Cigarettes    Quit date: 01/06/1998  . Smokeless tobacco: Never Used  . Alcohol Use: Not on file  . Drug Use: Not on file  . Sexually Active: Not on file   Other Topics Concern  . Not on file   Social History Narrative  . No narrative on file     Allergies  Allergen Reactions  . Clopidogrel Bisulfate     REACTION: rash     Outpatient Prescriptions Prior to Visit  Medication Sig Dispense Refill  . Ascorbic Acid (VITAMIN C) 500 MG tablet Take 500 mg by mouth daily.        Marland Kitchen aspirin 81 MG tablet Take 81 mg by mouth daily.        . furosemide (LASIX) 80 MG tablet TAKE ONE TABLET BY MOUTH TWICE DAILY  30 tablet  6  . gabapentin (NEURONTIN) 300 MG capsule Take 4 tablets by mouth two times a day.       . lovastatin (MEVACOR) 40 MG tablet Take 40 mg by mouth at bedtime.        . metFORMIN (GLUCOPHAGE) 500 MG tablet Take 500 mg by mouth 2 (two) times daily.       . metoprolol (TOPROL-XL) 50  MG 24 hr tablet Take 1/2 tablet by mouth once daily      . Multiple Vitamins-Minerals (CENTRUM PO) Take 1 tablet by mouth daily.        . Omega-3 Fatty Acids (FISH OIL) 1000 MG CAPS Take 1 capsule by mouth daily.        . OXYGEN-HELIUM IN Inhale 2 L/min into the lungs at bedtime.        . potassium chloride SA (K-DUR,KLOR-CON) 20 MEQ tablet Take 20 mEq by mouth daily.        . predniSONE (DELTASONE) 10 MG tablet Take 1/2 tablet by mouth daily.        . SYMBICORT 160-4.5 MCG/ACT inhaler INHALE 2 PUFFS INTO MOUTHTWICE DAILY  1 Inhaler  11  . tiotropium (SPIRIVA) 18 MCG inhalation capsule Place 18 mcg into inhaler and inhale daily.        Marland Kitchen warfarin (COUMADIN) 5 MG tablet TAKE AS DIRECTED  45 tablet  3      Review of Systems Constitutional:   No  weight loss, night sweats,  Fevers, chills, fatigue, lassitude. HEENT:   No headaches,  Difficulty swallowing,  Tooth/dental problems,  Sore  throat,                No sneezing, itching, ear ache, nasal congestion, post nasal drip,   CV:  No chest pain,  Orthopnea, PND, swelling in lower extremities, anasarca, dizziness, palpitations  GI  No heartburn, indigestion, abdominal pain, nausea, vomiting, diarrhea, change in bowel habits, loss of appetite  Resp: Notes  shortness of breath with exertion not at rest.  No excess mucus, notes white mucus,  productive cough,  No non-productive cough,  No coughing up of blood.  No change in color of mucus.  No wheezing.  No chest wall deformity  Skin: no rash or lesions.  GU: no dysuria, change in color of urine, no urgency or frequency.  No flank pain.  MS:  No joint pain or swelling.  No decreased range of motion.  No back pain.  Psych:  No change in mood or affect. No depression or anxiety.  No memory loss.     Objective:   Physical Exam Filed Vitals:   06/05/10 1449  BP: 130/80  Pulse: 98  Temp: 97.8 F (36.6 C)  TempSrc: Oral  Height: 5\' 10"  (1.778 m)  Weight: 244 lb 9.6 oz (110.95 kg)  SpO2: 91%    Gen: Pleasant, well-nourished, in no distress,  normal affect  ENT: No lesions,  mouth clear,  oropharynx clear, no postnasal drip  Neck: No JVD, no TMG, no carotid bruits  Lungs: No use of accessory muscles, no dullness to percussion, distant BS  Cardiovascular: RRR, heart sounds normal, no murmur or gallops, no peripheral edema  Abdomen: soft and NT, no HSM,  BS normal  Musculoskeletal: No deformities, no cyanosis or clubbing  Neuro: alert, non focal  Skin: Warm, no lesions or rashes        Assessment & Plan:   C O P D COPD Golds Stage III, stable at this time Plan No change in inhaled or maintenance medications. Return in  4 months     Updated Medication List Outpatient Encounter Prescriptions as of 06/05/2010  Medication Sig Dispense Refill  . Ascorbic Acid (VITAMIN C) 500 MG tablet Take 500 mg by mouth daily.        Marland Kitchen aspirin 81 MG tablet Take 81  mg by mouth daily.        Marland Kitchen  furosemide (LASIX) 80 MG tablet TAKE ONE TABLET BY MOUTH TWICE DAILY  30 tablet  6  . gabapentin (NEURONTIN) 300 MG capsule Take 4 tablets by mouth two times a day.       . lovastatin (MEVACOR) 40 MG tablet Take 40 mg by mouth at bedtime.        . metFORMIN (GLUCOPHAGE) 500 MG tablet Take 500 mg by mouth 2 (two) times daily.       . metoprolol (TOPROL-XL) 50 MG 24 hr tablet Take 1/2 tablet by mouth once daily      . Multiple Vitamins-Minerals (CENTRUM PO) Take 1 tablet by mouth daily.        . Omega-3 Fatty Acids (FISH OIL) 1000 MG CAPS Take 1 capsule by mouth daily.        . OXYGEN-HELIUM IN Inhale 2 L/min into the lungs at bedtime.        . potassium chloride SA (K-DUR,KLOR-CON) 20 MEQ tablet Take 20 mEq by mouth daily.        . predniSONE (DELTASONE) 10 MG tablet Take 1/2 tablet by mouth daily.        . SYMBICORT 160-4.5 MCG/ACT inhaler INHALE 2 PUFFS INTO MOUTHTWICE DAILY  1 Inhaler  11  . tiotropium (SPIRIVA) 18 MCG inhalation capsule Place 18 mcg into inhaler and inhale daily.        Marland Kitchen warfarin (COUMADIN) 5 MG tablet TAKE AS DIRECTED  45 tablet  3  . albuterol (VENTOLIN HFA) 108 (90 BASE) MCG/ACT inhaler Inhale 2 puffs into the lungs every 6 (six) hours as needed for wheezing.  1 Inhaler  6

## 2010-06-06 NOTE — Assessment & Plan Note (Signed)
COPD Golds Stage III, stable at this time Plan No change in inhaled or maintenance medications. Return in  4 months

## 2010-06-28 ENCOUNTER — Ambulatory Visit (INDEPENDENT_AMBULATORY_CARE_PROVIDER_SITE_OTHER): Payer: Medicare Other | Admitting: *Deleted

## 2010-06-28 DIAGNOSIS — Z7901 Long term (current) use of anticoagulants: Secondary | ICD-10-CM

## 2010-06-28 DIAGNOSIS — I4891 Unspecified atrial fibrillation: Secondary | ICD-10-CM

## 2010-06-28 LAB — POCT INR: INR: 3.2

## 2010-07-26 ENCOUNTER — Ambulatory Visit (INDEPENDENT_AMBULATORY_CARE_PROVIDER_SITE_OTHER): Payer: Medicare Other | Admitting: *Deleted

## 2010-07-26 DIAGNOSIS — I4891 Unspecified atrial fibrillation: Secondary | ICD-10-CM

## 2010-08-06 ENCOUNTER — Other Ambulatory Visit: Payer: Self-pay | Admitting: Cardiology

## 2010-08-09 ENCOUNTER — Other Ambulatory Visit: Payer: Self-pay | Admitting: *Deleted

## 2010-08-09 MED ORDER — FUROSEMIDE 80 MG PO TABS: 80.0000 mg | ORAL_TABLET | Freq: Two times a day (BID) | ORAL | Status: DC

## 2010-08-12 ENCOUNTER — Encounter: Payer: Self-pay | Admitting: Critical Care Medicine

## 2010-08-12 ENCOUNTER — Ambulatory Visit (INDEPENDENT_AMBULATORY_CARE_PROVIDER_SITE_OTHER): Payer: Medicare Other | Admitting: Critical Care Medicine

## 2010-08-12 VITALS — BP 122/78 | HR 53 | Temp 97.9°F | Ht 70.0 in | Wt 243.8 lb

## 2010-08-12 DIAGNOSIS — J441 Chronic obstructive pulmonary disease with (acute) exacerbation: Secondary | ICD-10-CM

## 2010-08-12 DIAGNOSIS — J449 Chronic obstructive pulmonary disease, unspecified: Secondary | ICD-10-CM

## 2010-08-12 MED ORDER — PREDNISONE 10 MG PO TABS: ORAL_TABLET | ORAL | Status: DC

## 2010-08-12 NOTE — Progress Notes (Signed)
Subjective:    Patient ID: [name], male    DOB: [birth year], 70 y.o.   MRN: [id number]  HPI  [name] is a 70 y.o.  white male, history of chronic obstructive  lung disease, primary emphysematous component  06/05/10 Notes more dyspnea in the heat.  Now some cough but mucus is white.  No real chest pain .   Notes some wheezing.  No real edema in feet. Pt denies any significant sore throat, nasal congestion or excess secretions, fever, chills, sweats, unintended weight loss, pleurtic or exertional chest pain, orthopnea PND, or leg swelling Pt denies any increase in rescue therapy over baseline, denies waking up needing it or having any early am or nocturnal exacerbations of coughing/wheezing/or dyspnea. Pt also denies any obvious fluctuation in symptoms with  weather or environmental change or other alleviating or aggravating factors   08/12/2010 Pt notes more dyspnea with minimal exertion,  Not able to do much without severe dyspnea.  Pt notes a dry cough.  No heartburn. Pt gradually worse this summer. Pt denies any significant sore throat, nasal congestion or excess secretions, fever, chills, sweats, unintended weight loss, pleurtic or exertional chest pain, orthopnea PND, or leg swelling Pt denies any increase in rescue therapy over baseline, denies waking up needing it or having any early am or nocturnal exacerbations of coughing/wheezing/or dyspnea. Pt also denies any obvious fluctuation in symptoms with  weather or environmental change or other alleviating or aggravating factors   Past Medical History  Diagnosis Date  . COPD with emphysema     chronic Dyspnea, followed by Dr. Dennie Bible. [place], oxygen at night started 11/2008  . Permanent atrial fibrillation   . Anticoagulant long-term use     Coumadin  . Dilated ventricle     Right Ventricle, mild moderately dilated by Echo 06/2008  . Ventricular dysfunction, right     RV pressure could not be estimated with no TR  . CAD  (coronary artery disease)     .MI with a Taxus stent in 2004..  /   nuclear... June, 2010.. small scar or slight ischemia inferior wall  . Diabetes mellitus   . Hyperlipemia   . Obesity   . Peripheral edema     Mild  . Hypertension     relative hypotension... August, 2011... Cozaar stopped  . Foot pain     Significant neuropathy has been evaluated at [facility].  . COPD (chronic obstructive pulmonary disease)      No family history on file.   History   Social History  . Marital Status: Married    Spouse Name: N/A    Number of Children: N/A  . Years of Education: N/A   Occupational History  . Dance movement psychotherapist    Social History Main Topics  . Smoking status: Former Smoker -- 2.0 packs/day for 40 years    Types: Cigarettes    Quit date: 01/06/1998  . Smokeless tobacco: Never Used  . Alcohol Use: Not on file  . Drug Use: Not on file  . Sexually Active: Not on file   Other Topics Concern  . Not on file   Social History Narrative  . No narrative on file     Allergies  Allergen Reactions  . Clopidogrel Bisulfate     REACTION: rash     Outpatient Prescriptions Prior to Visit  Medication Sig Dispense Refill  . albuterol (VENTOLIN HFA) 108 (90 BASE) MCG/ACT inhaler Inhale 2 puffs into the lungs every  6 (six) hours as needed for wheezing.  1 Inhaler  6  . Ascorbic Acid (VITAMIN C) 500 MG tablet Take 500 mg by mouth daily.        Marland Kitchen aspirin 81 MG tablet Take 81 mg by mouth daily.        . furosemide (LASIX) 80 MG tablet Take 1 tablet (80 mg total) by mouth 2 (two) times daily.  60 tablet  6  . gabapentin (NEURONTIN) 300 MG capsule Take 4 tablets by mouth two times a day.       Marland Kitchen KLOR-CON M20 20 MEQ tablet TAKE 1 TABLET ONCE DAILY  30 each  6  . lovastatin (MEVACOR) 40 MG tablet Take 40 mg by mouth at bedtime.        . metFORMIN (GLUCOPHAGE) 500 MG tablet Take 500 mg by mouth 2 (two) times daily.       . metoprolol (TOPROL-XL) 50 MG 24 hr tablet Take  1/2 tablet by mouth once daily      . Multiple Vitamins-Minerals (CENTRUM PO) Take 1 tablet by mouth daily.        . Omega-3 Fatty Acids (FISH OIL) 1000 MG CAPS Take 1 capsule by mouth daily.        . OXYGEN-HELIUM IN Inhale 2 L/min into the lungs at bedtime.        . SYMBICORT 160-4.5 MCG/ACT inhaler INHALE 2 PUFFS INTO MOUTHTWICE DAILY  1 Inhaler  11  . tiotropium (SPIRIVA) 18 MCG inhalation capsule Place 18 mcg into inhaler and inhale daily.        Marland Kitchen warfarin (COUMADIN) 5 MG tablet TAKE AS DIRECTED  45 tablet  3  . predniSONE (DELTASONE) 10 MG tablet Take 1/2 tablet by mouth daily.            Review of Systems  Constitutional:   No  weight loss, night sweats,  Fevers, chills, fatigue, lassitude. HEENT:   No headaches,  Difficulty swallowing,  Tooth/dental problems,  Sore throat,                No sneezing, itching, ear ache, nasal congestion, post nasal drip,   CV:  No chest pain,  Orthopnea, PND, swelling in lower extremities, anasarca, dizziness, palpitations  GI  No heartburn, indigestion, abdominal pain, nausea, vomiting, diarrhea, change in bowel habits, loss of appetite  Resp: Notes  shortness of breath with exertion not at rest.  No excess mucus, notes white mucus,  productive cough,  No non-productive cough,  No coughing up of blood.  No change in color of mucus.  No wheezing.  No chest wall deformity  Skin: no rash or lesions.  GU: no dysuria, change in color of urine, no urgency or frequency.  No flank pain.  MS:  No joint pain or swelling.  No decreased range of motion.  No back pain.  Psych:  No change in mood or affect. No depression or anxiety.  No memory loss.     Objective:   Physical Exam  Filed Vitals:   08/12/10 1538  BP: 122/78  Pulse: 53  Temp: 97.9 F (36.6 C)  TempSrc: Oral  Height: 5\' 10"  (1.778 m)  Weight: 243 lb 12.8 oz (110.587 kg)  SpO2: 94%    Gen: Pleasant, well-nourished, in no distress,  normal affect  ENT: No lesions,  mouth  clear,  oropharynx clear, no postnasal drip  Neck: No JVD, no TMG, no carotid bruits  Lungs: No use of accessory muscles, no dullness  to percussion, distant BS  Cardiovascular: RRR, heart sounds normal, no murmur or gallops, no peripheral edema  Abdomen: soft and NT, no HSM,  BS normal  Musculoskeletal: No deformities, no cyanosis or clubbing  Neuro: alert, non focal  Skin: Warm, no lesions or rashes        Assessment & Plan:   C O P D Acute copd exacerbation with underlying Golds copd stage III Now with exertional desaturation <88% Plan Start oxygen 2L rest 3L exertion Pulse prednisone  No change in inhaled or maintenance medications. Return in  2 months     Updated Medication List Outpatient Encounter Prescriptions as of 08/12/2010  Medication Sig Dispense Refill  . albuterol (VENTOLIN HFA) 108 (90 BASE) MCG/ACT inhaler Inhale 2 puffs into the lungs every 6 (six) hours as needed for wheezing.  1 Inhaler  6  . Ascorbic Acid (VITAMIN C) 500 MG tablet Take 500 mg by mouth daily.        Marland Kitchen aspirin 81 MG tablet Take 81 mg by mouth daily.        . furosemide (LASIX) 80 MG tablet Take 1 tablet (80 mg total) by mouth 2 (two) times daily.  60 tablet  6  . gabapentin (NEURONTIN) 300 MG capsule Take 4 tablets by mouth two times a day.       Marland Kitchen KLOR-CON M20 20 MEQ tablet TAKE 1 TABLET ONCE DAILY  30 each  6  . lovastatin (MEVACOR) 40 MG tablet Take 40 mg by mouth at bedtime.        . metFORMIN (GLUCOPHAGE) 500 MG tablet Take 500 mg by mouth 2 (two) times daily.       . metoprolol (TOPROL-XL) 50 MG 24 hr tablet Take 1/2 tablet by mouth once daily      . Multiple Vitamins-Minerals (CENTRUM PO) Take 1 tablet by mouth daily.        . Omega-3 Fatty Acids (FISH OIL) 1000 MG CAPS Take 1 capsule by mouth daily.        . OXYGEN-HELIUM IN Inhale 2 L/min into the lungs at bedtime.        . predniSONE (DELTASONE) 10 MG tablet Take 4 for three days, 3 for three days, 2 for three days, 1 for  three days then 1/2 daily and stay  60 tablet  6  . SYMBICORT 160-4.5 MCG/ACT inhaler INHALE 2 PUFFS INTO MOUTHTWICE DAILY  1 Inhaler  11  . tiotropium (SPIRIVA) 18 MCG inhalation capsule Place 18 mcg into inhaler and inhale daily.        Marland Kitchen warfarin (COUMADIN) 5 MG tablet TAKE AS DIRECTED  45 tablet  3  . DISCONTD: predniSONE (DELTASONE) 10 MG tablet Take 1/2 tablet by mouth daily.

## 2010-08-12 NOTE — Patient Instructions (Signed)
I will order light weight portable oxygen system from [facility] Prednisone 10mg  Take 4 for three days, 3 for three days, 2 for three days, 1 for three days then 1/2 daily and stay No other medication changes Return 2 months

## 2010-08-12 NOTE — Assessment & Plan Note (Signed)
Acute copd exacerbation with underlying Golds copd stage III Now with exertional desaturation <88% Plan Start oxygen 2L rest 3L exertion Pulse prednisone  No change in inhaled or maintenance medications. Return in  2 months

## 2010-08-12 NOTE — Progress Notes (Deleted)
  Subjective:    Patient ID: [name], male    DOB: 02-16-1940, 70 y.o.   MRN: [id number]  HPI    Review of Systems     Objective:   Physical Exam        Assessment & Plan:

## 2010-08-23 ENCOUNTER — Ambulatory Visit (INDEPENDENT_AMBULATORY_CARE_PROVIDER_SITE_OTHER): Payer: Medicare Other | Admitting: *Deleted

## 2010-08-23 DIAGNOSIS — I4891 Unspecified atrial fibrillation: Secondary | ICD-10-CM

## 2010-08-23 DIAGNOSIS — Z7901 Long term (current) use of anticoagulants: Secondary | ICD-10-CM

## 2010-09-13 ENCOUNTER — Other Ambulatory Visit: Payer: Self-pay | Admitting: Critical Care Medicine

## 2010-09-17 ENCOUNTER — Other Ambulatory Visit: Payer: Self-pay | Admitting: Cardiology

## 2010-09-20 ENCOUNTER — Encounter: Payer: Self-pay | Admitting: Cardiology

## 2010-09-20 DIAGNOSIS — I482 Chronic atrial fibrillation, unspecified: Secondary | ICD-10-CM | POA: Insufficient documentation

## 2010-09-20 DIAGNOSIS — J449 Chronic obstructive pulmonary disease, unspecified: Secondary | ICD-10-CM | POA: Insufficient documentation

## 2010-09-20 DIAGNOSIS — I519 Heart disease, unspecified: Secondary | ICD-10-CM | POA: Insufficient documentation

## 2010-09-20 DIAGNOSIS — E785 Hyperlipidemia, unspecified: Secondary | ICD-10-CM | POA: Insufficient documentation

## 2010-09-20 DIAGNOSIS — R600 Localized edema: Secondary | ICD-10-CM | POA: Insufficient documentation

## 2010-09-20 DIAGNOSIS — I10 Essential (primary) hypertension: Secondary | ICD-10-CM | POA: Insufficient documentation

## 2010-09-20 DIAGNOSIS — I2510 Atherosclerotic heart disease of native coronary artery without angina pectoris: Secondary | ICD-10-CM | POA: Insufficient documentation

## 2010-09-20 DIAGNOSIS — R609 Edema, unspecified: Secondary | ICD-10-CM | POA: Insufficient documentation

## 2010-09-20 DIAGNOSIS — E669 Obesity, unspecified: Secondary | ICD-10-CM | POA: Insufficient documentation

## 2010-09-20 DIAGNOSIS — Z7901 Long term (current) use of anticoagulants: Secondary | ICD-10-CM | POA: Insufficient documentation

## 2010-09-24 ENCOUNTER — Ambulatory Visit (INDEPENDENT_AMBULATORY_CARE_PROVIDER_SITE_OTHER): Payer: Medicare Other | Admitting: *Deleted

## 2010-09-24 ENCOUNTER — Ambulatory Visit (INDEPENDENT_AMBULATORY_CARE_PROVIDER_SITE_OTHER): Payer: Medicare Other | Admitting: Cardiology

## 2010-09-24 ENCOUNTER — Encounter: Payer: Self-pay | Admitting: Cardiology

## 2010-09-24 DIAGNOSIS — Z7901 Long term (current) use of anticoagulants: Secondary | ICD-10-CM

## 2010-09-24 DIAGNOSIS — J439 Emphysema, unspecified: Secondary | ICD-10-CM

## 2010-09-24 DIAGNOSIS — J438 Other emphysema: Secondary | ICD-10-CM

## 2010-09-24 DIAGNOSIS — I2510 Atherosclerotic heart disease of native coronary artery without angina pectoris: Secondary | ICD-10-CM

## 2010-09-24 DIAGNOSIS — I4891 Unspecified atrial fibrillation: Secondary | ICD-10-CM

## 2010-09-24 NOTE — Assessment & Plan Note (Signed)
The patient will continue to follow up with pulmonary doctors.  I'll plan to see him back for cardiology followup in 6 months.

## 2010-09-24 NOTE — Assessment & Plan Note (Signed)
Coumadin will be continued. No change in therapy. 

## 2010-09-24 NOTE — Assessment & Plan Note (Signed)
Coronary disease is stable. No change in therapy. 

## 2010-09-24 NOTE — Progress Notes (Signed)
HPI Patient is seen today to followup coronary artery disease and atrial fibrillation.  I saw him last April, 2012.  Is not having any chest pain.  He does have shortness of breath related to his lung disease.  He has been seen by the pulmonary team and his meds adjusted.  He has lost 8 pounds.  He is using oxygen in the daytime. Allergies  Allergen Reactions  . Clopidogrel Bisulfate     REACTION: rash    Current Outpatient Prescriptions  Medication Sig Dispense Refill  . albuterol (VENTOLIN HFA) 108 (90 BASE) MCG/ACT inhaler Inhale 2 puffs into the lungs every 6 (six) hours as needed for wheezing.  1 Inhaler  6  . Ascorbic Acid (VITAMIN C) 500 MG tablet Take 500 mg by mouth daily.        Marland Kitchen aspirin 81 MG tablet Take 81 mg by mouth daily.        . furosemide (LASIX) 80 MG tablet Take 1 tablet (80 mg total) by mouth 2 (two) times daily.  60 tablet  6  . gabapentin (NEURONTIN) 300 MG capsule Take 4 tablets by mouth two times a day.       Marland Kitchen KLOR-CON M20 20 MEQ tablet TAKE 1 TABLET ONCE DAILY  30 each  6  . lovastatin (MEVACOR) 40 MG tablet Take 40 mg by mouth at bedtime.        . metFORMIN (GLUCOPHAGE) 500 MG tablet Take 500 mg by mouth 2 (two) times daily.       . metoprolol (TOPROL-XL) 50 MG 24 hr tablet Take 1/2 tablet by mouth once daily      . Multiple Vitamins-Minerals (CENTRUM PO) Take 1 tablet by mouth daily.        . Omega-3 Fatty Acids (FISH OIL) 1000 MG CAPS Take 1 capsule by mouth daily.        . OXYGEN-HELIUM IN Inhale 2 L/min into the lungs at bedtime.        . predniSONE (DELTASONE) 10 MG tablet Take 4 for three days, 3 for three days, 2 for three days, 1 for three days then 1/2 daily and stay  60 tablet  6  . SPIRIVA HANDIHALER 18 MCG inhalation capsule PLACE ONE CAPSULE IN MOUTHPIECE (HANDIHALER) AND INHALE IN MOUTH ONCEA DAY.  30 each  6  . SYMBICORT 160-4.5 MCG/ACT inhaler INHALE 2 PUFFS INTO MOUTHTWICE DAILY  1 Inhaler  11  . warfarin (COUMADIN) 5 MG tablet TAKE AS DIRECTED   45 tablet  3    History   Social History  . Marital Status: Married    Spouse Name: N/A    Number of Children: N/A  . Years of Education: N/A   Occupational History  . Dance movement psychotherapist    Social History Main Topics  . Smoking status: Former Smoker -- 2.0 packs/day for 40 years    Types: Cigarettes    Quit date: 01/06/1998  . Smokeless tobacco: Never Used  . Alcohol Use: Not on file  . Drug Use: Not on file  . Sexually Active: Not on file   Other Topics Concern  . Not on file   Social History Narrative  . No narrative on file    No family history on file.  Past Medical History  Diagnosis Date  . COPD with emphysema     chronic Dyspnea, followed by Dr. Dennie Bible. [place], oxygen at night started 11/2008  . Atrial fibrillation     Permanent  . Warfarin anticoagulation  Coumadin  . Right ventricular dysfunction     Right Ventricle, mild moderately dilated by Echo 06/2008  . CAD (coronary artery disease)     .MI with a Taxus stent in 2004..  /   nuclear... June, 2010.. small scar or slight ischemia inferior wall  . Diabetes mellitus   . Hyperlipemia   . Obesity   . Peripheral edema     Mild  . Hypertension     relative hypotension... August, 2011... Cozaar stopped  . Foot pain     Significant neuropathy has been evaluated at [facility].    Past Surgical History  Procedure Date  . Rotator cuff repair     x 3  . Knee surgery     ROS  Patient denies fever, chills, headache, sweats, rash, change in vision, change in hearing, chest pain, nausea vomiting, urinary symptoms.  All other systems are reviewed and are negative. PHYSICAL EXAM Patient is stable for him today.  Head is atraumatic.  Venous distention.  Lungs reveal a few scattered rhonchi there is no respiratory distress.  Cardiac exam reveals S1-S2.  No clicks or significant murmurs.  The abdomen is protuberant but soft.  There's no peripheral edema. There were no vitals filed for this  visit.  EKG EKG is not done today. ASSESSMENT & PLAN

## 2010-09-24 NOTE — Patient Instructions (Signed)
Your physician wants you to follow-up in: 6 months. You will receive a reminder letter in the mail one-two months in advance. If you don't receive a letter, please call our office to schedule the follow-up appointment. Your physician recommends that you continue on your current medications as directed. Please refer to the Current Medication list given to you today. 

## 2010-10-15 ENCOUNTER — Encounter: Payer: Self-pay | Admitting: Critical Care Medicine

## 2010-10-15 ENCOUNTER — Ambulatory Visit (INDEPENDENT_AMBULATORY_CARE_PROVIDER_SITE_OTHER): Payer: Medicare Other | Admitting: Critical Care Medicine

## 2010-10-15 VITALS — BP 104/72 | HR 86 | Temp 98.1°F | Ht 69.0 in | Wt 242.6 lb

## 2010-10-15 DIAGNOSIS — J439 Emphysema, unspecified: Secondary | ICD-10-CM

## 2010-10-15 DIAGNOSIS — J42 Unspecified chronic bronchitis: Secondary | ICD-10-CM

## 2010-10-15 DIAGNOSIS — Z23 Encounter for immunization: Secondary | ICD-10-CM

## 2010-10-15 DIAGNOSIS — J438 Other emphysema: Secondary | ICD-10-CM

## 2010-10-15 MED ORDER — PREDNISONE 10 MG PO TABS: ORAL_TABLET | ORAL | Status: DC

## 2010-10-15 MED ORDER — FLUTTER DEVI: Status: DC

## 2010-10-15 NOTE — Progress Notes (Signed)
Addended by: Ozella Almond R on: 10/15/2010 12:21 PM   Modules accepted: Orders

## 2010-10-15 NOTE — Progress Notes (Signed)
Subjective:    Patient ID: [name], male    DOB: 05/31/1940, 70 y.o.   MRN: [id number]  HPI  [name] is a 70 y.o.  white male, history of chronic obstructive  lung disease, primary emphysematous component    10/15/2010 Since her last visit the patient still has significant secretions. The cough is productive of clear mucus. The patient's dyspnea continues with exertion. The patient is now on oxygen with exertion but is doing well with oxygen at night. The patient maintain Symbicort and Spiriva. Pt denies any significant sore throat, nasal congestion or excess secretions, fever, chills, sweats, unintended weight loss, pleurtic or exertional chest pain, orthopnea PND, or leg swelling Pt denies any increase in rescue therapy over baseline, denies waking up needing it or having any early am or nocturnal exacerbations of coughing/wheezing/or dyspnea. Pt also denies any obvious fluctuation in symptoms with  weather or environmental change or other alleviating or aggravating factors    Past Medical History  Diagnosis Date  . COPD with emphysema     chronic Dyspnea, followed by Dr. Dennie Bible. [place], oxygen at night started 11/2008  . Atrial fibrillation     Permanent  . Warfarin anticoagulation     Coumadin  . Right ventricular dysfunction     Right Ventricle, mild moderately dilated by Echo 06/2008  . CAD (coronary artery disease)     .MI with a Taxus stent in 2004..  /   nuclear... June, 2010.. small scar or slight ischemia inferior wall  . Diabetes mellitus   . Hyperlipemia   . Obesity   . Peripheral edema     Mild  . Hypertension     relative hypotension... August, 2011... Cozaar stopped  . Foot pain     Significant neuropathy has been evaluated at [facility].     History reviewed. No pertinent family history.   History   Social History  . Marital Status: Married    Spouse Name: N/A    Number of Children: N/A  . Years of Education: N/A    Occupational History  . Dance movement psychotherapist    Social History Main Topics  . Smoking status: Former Smoker -- 2.0 packs/day for 40 years    Types: Cigarettes    Quit date: 01/06/1998  . Smokeless tobacco: Never Used  . Alcohol Use: Not on file  . Drug Use: Not on file  . Sexually Active: Not on file   Other Topics Concern  . Not on file   Social History Narrative  . No narrative on file     Allergies  Allergen Reactions  . Clopidogrel Bisulfate     REACTION: rash     Outpatient Prescriptions Prior to Visit  Medication Sig Dispense Refill  . albuterol (VENTOLIN HFA) 108 (90 BASE) MCG/ACT inhaler Inhale 2 puffs into the lungs every 6 (six) hours as needed for wheezing.  1 Inhaler  6  . Ascorbic Acid (VITAMIN C) 500 MG tablet Take 500 mg by mouth daily.        Marland Kitchen aspirin 81 MG tablet Take 81 mg by mouth daily.        . furosemide (LASIX) 80 MG tablet Take 1 tablet (80 mg total) by mouth 2 (two) times daily.  60 tablet  6  . gabapentin (NEURONTIN) 300 MG capsule Take 4 tablets by mouth two times a day.       Marland Kitchen KLOR-CON M20 20 MEQ tablet TAKE 1 TABLET ONCE DAILY  30  each  6  . lovastatin (MEVACOR) 40 MG tablet Take 40 mg by mouth at bedtime.        . metFORMIN (GLUCOPHAGE) 500 MG tablet Take 500 mg by mouth 2 (two) times daily.       . metoprolol (TOPROL-XL) 50 MG 24 hr tablet Take 1/2 tablet by mouth once daily      . Multiple Vitamins-Minerals (CENTRUM PO) Take 1 tablet by mouth daily.        . Omega-3 Fatty Acids (FISH OIL) 1000 MG CAPS Take 1 capsule by mouth daily.        Marland Kitchen SPIRIVA HANDIHALER 18 MCG inhalation capsule PLACE ONE CAPSULE IN MOUTHPIECE (HANDIHALER) AND INHALE IN MOUTH ONCEA DAY.  30 each  6  . SYMBICORT 160-4.5 MCG/ACT inhaler INHALE 2 PUFFS INTO MOUTHTWICE DAILY  1 Inhaler  11  . warfarin (COUMADIN) 5 MG tablet TAKE AS DIRECTED  45 tablet  3  . OXYGEN-HELIUM IN Inhale 2 L/min into the lungs at bedtime.        . predniSONE (DELTASONE) 10 MG tablet Take 4 for  three days, 3 for three days, 2 for three days, 1 for three days then 1/2 daily and stay  60 tablet  6      Review of Systems  Constitutional:   No  weight loss, night sweats,  Fevers, chills, fatigue, lassitude. HEENT:   No headaches,  Difficulty swallowing,  Tooth/dental problems,  Sore throat,                No sneezing, itching, ear ache, nasal congestion, post nasal drip,   CV:  No chest pain,  Orthopnea, PND, swelling in lower extremities, anasarca, dizziness, palpitations  GI  No heartburn, indigestion, abdominal pain, nausea, vomiting, diarrhea, change in bowel habits, loss of appetite  Resp: Notes  shortness of breath with exertion not at rest.  No excess mucus, notes white mucus,  productive cough,  No non-productive cough,  No coughing up of blood.  No change in color of mucus.  No wheezing.  No chest wall deformity  Skin: no rash or lesions.  GU: no dysuria, change in color of urine, no urgency or frequency.  No flank pain.  MS:  No joint pain or swelling.  No decreased range of motion.  No back pain.  Psych:  No change in mood or affect. No depression or anxiety.  No memory loss.     Objective:   Physical Exam  Filed Vitals:   10/15/10 0844  BP: 104/72  Pulse: 86  Temp: 98.1 F (36.7 C)  TempSrc: Oral  Height: 5\' 9"  (1.753 m)  Weight: 242 lb 9.6 oz (110.043 kg)  SpO2: 94%    Gen: Pleasant, well-nourished, in no distress,  normal affect  ENT: No lesions,  mouth clear,  oropharynx clear, no postnasal drip  Neck: No JVD, no TMG, no carotid bruits  Lungs: No use of accessory muscles, no dullness to percussion, distant BS, coarse rhonchi  Cardiovascular: RRR, heart sounds normal, no murmur or gallops, no peripheral edema  Abdomen: soft and NT, no HSM,  BS normal  Musculoskeletal: No deformities, no cyanosis or clubbing  Neuro: alert, non focal  Skin: Warm, no lesions or rashes        Assessment & Plan:   COPD with emphysema Severe chronic  obstructive lung disease with primary emphysematous component and ongoing secretions with mucus plugging Plan Obtain for patient a flutter valve Continued Symbicort and Spiriva use Increase  prednisone and then taper to 10 mg a day and stay Flu vaccine will be administered Continue oxygen at night and as needed during the daytime     Updated Medication List Outpatient Encounter Prescriptions as of 10/15/2010  Medication Sig Dispense Refill  . albuterol (VENTOLIN HFA) 108 (90 BASE) MCG/ACT inhaler Inhale 2 puffs into the lungs every 6 (six) hours as needed for wheezing.  1 Inhaler  6  . Ascorbic Acid (VITAMIN C) 500 MG tablet Take 500 mg by mouth daily.        Marland Kitchen aspirin 81 MG tablet Take 81 mg by mouth daily.        . furosemide (LASIX) 80 MG tablet Take 1 tablet (80 mg total) by mouth 2 (two) times daily.  60 tablet  6  . gabapentin (NEURONTIN) 300 MG capsule Take 4 tablets by mouth two times a day.       Marland Kitchen KLOR-CON M20 20 MEQ tablet TAKE 1 TABLET ONCE DAILY  30 each  6  . lovastatin (MEVACOR) 40 MG tablet Take 40 mg by mouth at bedtime.        . metFORMIN (GLUCOPHAGE) 500 MG tablet Take 500 mg by mouth 2 (two) times daily.       . metoprolol (TOPROL-XL) 50 MG 24 hr tablet Take 1/2 tablet by mouth once daily      . Multiple Vitamins-Minerals (CENTRUM PO) Take 1 tablet by mouth daily.        . Omega-3 Fatty Acids (FISH OIL) 1000 MG CAPS Take 1 capsule by mouth daily.        . predniSONE (DELTASONE) 10 MG tablet Take 4 for three days 3 for three days 2 for three days then one daily and stay  60 tablet  6  . SPIRIVA HANDIHALER 18 MCG inhalation capsule PLACE ONE CAPSULE IN MOUTHPIECE (HANDIHALER) AND INHALE IN MOUTH ONCEA DAY.  30 each  6  . SYMBICORT 160-4.5 MCG/ACT inhaler INHALE 2 PUFFS INTO MOUTHTWICE DAILY  1 Inhaler  11  . warfarin (COUMADIN) 5 MG tablet TAKE AS DIRECTED  45 tablet  3  . DISCONTD: OXYGEN-HELIUM IN Inhale 2 L/min into the lungs at bedtime.        Marland Kitchen DISCONTD: predniSONE  (DELTASONE) 10 MG tablet Take 4 for three days, 3 for three days, 2 for three days, 1 for three days then 1/2 daily and stay  60 tablet  6  . DISCONTD: predniSONE (DELTASONE) 10 MG tablet Take 5 mg by mouth daily.       Marland Kitchen Respiratory Therapy Supplies (FLUTTER) DEVI Use 4 times daily  1 each  0

## 2010-10-15 NOTE — Patient Instructions (Signed)
Flu vaccine today Prednisone 10mg  Take 4 for three days 3 for three days 2 for three days then one daily Flutter valve 4 times daily No other medication changes Return 2 months

## 2010-10-15 NOTE — Assessment & Plan Note (Signed)
Severe chronic obstructive lung disease with primary emphysematous component and ongoing secretions with mucus plugging Plan Obtain for patient a flutter valve Continued Symbicort and Spiriva use Increase prednisone and then taper to 10 mg a day and stay Flu vaccine will be administered Continue oxygen at night and as needed during the daytime

## 2010-10-22 ENCOUNTER — Ambulatory Visit (INDEPENDENT_AMBULATORY_CARE_PROVIDER_SITE_OTHER): Payer: Medicare Other | Admitting: *Deleted

## 2010-10-22 DIAGNOSIS — Z7901 Long term (current) use of anticoagulants: Secondary | ICD-10-CM

## 2010-10-22 DIAGNOSIS — I4891 Unspecified atrial fibrillation: Secondary | ICD-10-CM

## 2010-10-22 LAB — POCT INR: INR: 2.1

## 2010-11-12 ENCOUNTER — Ambulatory Visit (INDEPENDENT_AMBULATORY_CARE_PROVIDER_SITE_OTHER): Payer: Medicare Other | Admitting: *Deleted

## 2010-11-12 DIAGNOSIS — Z7901 Long term (current) use of anticoagulants: Secondary | ICD-10-CM

## 2010-11-12 DIAGNOSIS — I4891 Unspecified atrial fibrillation: Secondary | ICD-10-CM

## 2010-11-12 LAB — POCT INR: INR: 1.7

## 2010-12-06 ENCOUNTER — Ambulatory Visit (INDEPENDENT_AMBULATORY_CARE_PROVIDER_SITE_OTHER): Payer: Medicare Other | Admitting: *Deleted

## 2010-12-06 DIAGNOSIS — Z7901 Long term (current) use of anticoagulants: Secondary | ICD-10-CM

## 2010-12-06 DIAGNOSIS — I4891 Unspecified atrial fibrillation: Secondary | ICD-10-CM

## 2010-12-13 ENCOUNTER — Ambulatory Visit (INDEPENDENT_AMBULATORY_CARE_PROVIDER_SITE_OTHER): Payer: Medicare Other | Admitting: Critical Care Medicine

## 2010-12-13 ENCOUNTER — Encounter: Payer: Self-pay | Admitting: Critical Care Medicine

## 2010-12-13 VITALS — BP 112/72 | HR 92 | Temp 97.8°F | Ht 69.0 in | Wt 248.4 lb

## 2010-12-13 DIAGNOSIS — J439 Emphysema, unspecified: Secondary | ICD-10-CM

## 2010-12-13 DIAGNOSIS — J438 Other emphysema: Secondary | ICD-10-CM

## 2010-12-13 NOTE — Progress Notes (Signed)
Subjective:    Patient ID: [name], male    DOB: 19-May-1940, 70 y.o.   MRN: [id number]  HPI  [name] is a 70 y.o.  white male, history of chronic obstructive  lung disease, primary emphysematous component    10/15/10 Since her last visit the patient still has significant secretions. The cough is productive of clear mucus. The patient's dyspnea continues with exertion. The patient is now on oxygen with exertion but is doing well with oxygen at night. The patient maintain Symbicort and Spiriva. Pt denies any significant sore throat, nasal congestion or excess secretions, fever, chills, sweats, unintended weight loss, pleurtic or exertional chest pain, orthopnea PND, or leg swelling Pt denies any increase in rescue therapy over baseline, denies waking up needing it or having any early am or nocturnal exacerbations of coughing/wheezing/or dyspnea. Pt also denies any obvious fluctuation in symptoms with  weather or environmental change or other alleviating or aggravating factors  12/7 At last ov we [id number] Symbicort and Spiriva use Increase prednisone and then taper to 10 mg a day and stay Flu vaccine will be administered Continue oxygen at night and as needed during the daytime Pred helped and flutter valve helps and uses twice daily.  Coughs up clear mucus. No real chest pain.  Notes more heartburn last two weeks.   Past Medical History  Diagnosis Date  . COPD with emphysema     chronic Dyspnea, followed by Dr. Dennie Bible. Wright, oxygen at night started 11/2008  . Atrial fibrillation     Permanent  . Warfarin anticoagulation     Coumadin  . Right ventricular dysfunction     Right Ventricle, mild moderately dilated by Echo 06/2008  . CAD (coronary artery disease)     .MI with a Taxus stent in 2004..  /   nuclear... June, 2010.. small scar or slight ischemia inferior wall  . Diabetes mellitus   . Hyperlipemia   . Obesity   . Peripheral edema     Mild  . Hypertension    relative hypotension... August, 2011... Cozaar stopped  . Foot pain     Significant neuropathy has been evaluated at [facility].     No family history on file.   History   Social History  . Marital Status: Married    Spouse Name: N/A    Number of Children: N/A  . Years of Education: N/A   Occupational History  . Dance movement psychotherapist    Social History Main Topics  . Smoking status: Former Smoker -- 2.0 packs/day for 40 years    Types: Cigarettes    Quit date: 01/06/1998  . Smokeless tobacco: Never Used  . Alcohol Use: Not on file  . Drug Use: Not on file  . Sexually Active: Not on file   Other Topics Concern  . Not on file   Social History Narrative  . No narrative on file     Allergies  Allergen Reactions  . Clopidogrel Bisulfate     REACTION: rash     Outpatient Prescriptions Prior to Visit  Medication Sig Dispense Refill  . albuterol (VENTOLIN HFA) 108 (90 BASE) MCG/ACT inhaler Inhale 2 puffs into the lungs every 6 (six) hours as needed for wheezing.  1 Inhaler  6  . Ascorbic Acid (VITAMIN C) 500 MG tablet Take 500 mg by mouth daily.        Marland Kitchen aspirin 81 MG tablet Take 81 mg by mouth daily.        Marland Kitchen  furosemide (LASIX) 80 MG tablet Take 1 tablet (80 mg total) by mouth 2 (two) times daily.  60 tablet  6  . gabapentin (NEURONTIN) 300 MG capsule Take 4 tablets by mouth two times a day.       Marland Kitchen KLOR-CON M20 20 MEQ tablet TAKE 1 TABLET ONCE DAILY  30 each  6  . lovastatin (MEVACOR) 40 MG tablet Take 40 mg by mouth at bedtime.        . metFORMIN (GLUCOPHAGE) 500 MG tablet Take 500 mg by mouth 2 (two) times daily.       . metoprolol (TOPROL-XL) 50 MG 24 hr tablet Take 1/2 tablet by mouth once daily      . Multiple Vitamins-Minerals (CENTRUM PO) Take 1 tablet by mouth daily.        . Omega-3 Fatty Acids (FISH OIL) 1000 MG CAPS Take 1 capsule by mouth daily.        Marland Kitchen Respiratory Therapy Supplies (FLUTTER) DEVI Use 4 times daily  1 each  0  . SPIRIVA  HANDIHALER 18 MCG inhalation capsule PLACE ONE CAPSULE IN MOUTHPIECE (HANDIHALER) AND INHALE IN MOUTH ONCEA DAY.  30 each  6  . SYMBICORT 160-4.5 MCG/ACT inhaler INHALE 2 PUFFS INTO MOUTHTWICE DAILY  1 Inhaler  11  . warfarin (COUMADIN) 5 MG tablet TAKE AS DIRECTED  45 tablet  3  . predniSONE (DELTASONE) 10 MG tablet Take 4 for three days 3 for three days 2 for three days then one daily and stay  60 tablet  6      Review of Systems  Constitutional:   No  weight loss, night sweats,  Fevers, chills, fatigue, lassitude. HEENT:   No headaches,  Difficulty swallowing,  Tooth/dental problems,  Sore throat,                No sneezing, itching, ear ache, nasal congestion, post nasal drip,   CV:  No chest pain,  Orthopnea, PND, swelling in lower extremities, anasarca, dizziness, palpitations  GI  No heartburn, indigestion, abdominal pain, nausea, vomiting, diarrhea, change in bowel habits, loss of appetite  Resp: Notes  shortness of breath with exertion not at rest.  No excess mucus, notes white mucus,  productive cough,  No non-productive cough,  No coughing up of blood.  No change in color of mucus.  No wheezing.  No chest wall deformity  Skin: no rash or lesions.  GU: no dysuria, change in color of urine, no urgency or frequency.  No flank pain.  MS:  No joint pain or swelling.  No decreased range of motion.  No back pain.  Psych:  No change in mood or affect. No depression or anxiety.  No memory loss.     Objective:   Physical Exam  Filed Vitals:   12/13/10 1122  BP: 112/72  Pulse: 92  Temp: 97.8 F (36.6 C)  TempSrc: Oral  Height: 5\' 9"  (1.753 m)  Weight: 248 lb 6.4 oz (112.674 kg)  SpO2: 94%    Gen: Pleasant, well-nourished, in no distress,  normal affect  ENT: No lesions,  mouth clear,  oropharynx clear, no postnasal drip  Neck: No JVD, no TMG, no carotid bruits  Lungs: No use of accessory muscles, no dullness to percussion, distant BS, coarse rhonchi  Cardiovascular:  RRR, heart sounds normal, no murmur or gallops, no peripheral edema  Abdomen: soft and NT, no HSM,  BS normal  Musculoskeletal: No deformities, no cyanosis or clubbing  Neuro: alert, non  focal  Skin: Warm, no lesions or rashes        Assessment & Plan:   COPD with emphysema Stable Copd Plan No change in inhaled or maintenance medications. Return in  4 months     Updated Medication List Outpatient Encounter Prescriptions as of 12/13/2010  Medication Sig Dispense Refill  . albuterol (VENTOLIN HFA) 108 (90 BASE) MCG/ACT inhaler Inhale 2 puffs into the lungs every 6 (six) hours as needed for wheezing.  1 Inhaler  6  . Ascorbic Acid (VITAMIN C) 500 MG tablet Take 500 mg by mouth daily.        Marland Kitchen aspirin 81 MG tablet Take 81 mg by mouth daily.        . furosemide (LASIX) 80 MG tablet Take 1 tablet (80 mg total) by mouth 2 (two) times daily.  60 tablet  6  . gabapentin (NEURONTIN) 300 MG capsule Take 4 tablets by mouth two times a day.       Marland Kitchen KLOR-CON M20 20 MEQ tablet TAKE 1 TABLET ONCE DAILY  30 each  6  . lovastatin (MEVACOR) 40 MG tablet Take 40 mg by mouth at bedtime.        . metFORMIN (GLUCOPHAGE) 500 MG tablet Take 500 mg by mouth 2 (two) times daily.       . metoprolol (TOPROL-XL) 50 MG 24 hr tablet Take 1/2 tablet by mouth once daily      . Multiple Vitamins-Minerals (CENTRUM PO) Take 1 tablet by mouth daily.        . Omega-3 Fatty Acids (FISH OIL) 1000 MG CAPS Take 1 capsule by mouth daily.        . predniSONE (DELTASONE) 10 MG tablet Take 10 mg by mouth daily.        Marland Kitchen Respiratory Therapy Supplies (FLUTTER) DEVI Use 4 times daily  1 each  0  . SPIRIVA HANDIHALER 18 MCG inhalation capsule PLACE ONE CAPSULE IN MOUTHPIECE (HANDIHALER) AND INHALE IN MOUTH ONCEA DAY.  30 each  6  . SYMBICORT 160-4.5 MCG/ACT inhaler INHALE 2 PUFFS INTO MOUTHTWICE DAILY  1 Inhaler  11  . warfarin (COUMADIN) 5 MG tablet TAKE AS DIRECTED  45 tablet  3  . DISCONTD: predniSONE (DELTASONE) 10 MG  tablet Take 4 for three days 3 for three days 2 for three days then one daily and stay  60 tablet  6

## 2010-12-13 NOTE — Patient Instructions (Signed)
No change in medications. Return in         3 months Reflux diet

## 2010-12-14 NOTE — Assessment & Plan Note (Signed)
Stable Copd Plan No change in inhaled or maintenance medications. Return in  4 months

## 2010-12-16 ENCOUNTER — Ambulatory Visit: Payer: Medicare Other | Admitting: Critical Care Medicine

## 2011-01-03 ENCOUNTER — Ambulatory Visit (INDEPENDENT_AMBULATORY_CARE_PROVIDER_SITE_OTHER): Payer: Medicare Other | Admitting: *Deleted

## 2011-01-03 DIAGNOSIS — I4891 Unspecified atrial fibrillation: Secondary | ICD-10-CM

## 2011-01-03 DIAGNOSIS — Z7901 Long term (current) use of anticoagulants: Secondary | ICD-10-CM

## 2011-01-31 ENCOUNTER — Ambulatory Visit (INDEPENDENT_AMBULATORY_CARE_PROVIDER_SITE_OTHER): Payer: Medicare Other | Admitting: *Deleted

## 2011-01-31 DIAGNOSIS — Z7901 Long term (current) use of anticoagulants: Secondary | ICD-10-CM

## 2011-01-31 DIAGNOSIS — I4891 Unspecified atrial fibrillation: Secondary | ICD-10-CM

## 2011-02-28 ENCOUNTER — Ambulatory Visit (INDEPENDENT_AMBULATORY_CARE_PROVIDER_SITE_OTHER): Payer: Medicare Other | Admitting: *Deleted

## 2011-02-28 DIAGNOSIS — Z7901 Long term (current) use of anticoagulants: Secondary | ICD-10-CM

## 2011-02-28 DIAGNOSIS — I4891 Unspecified atrial fibrillation: Secondary | ICD-10-CM

## 2011-03-04 ENCOUNTER — Other Ambulatory Visit: Payer: Self-pay | Admitting: Cardiology

## 2011-03-19 ENCOUNTER — Other Ambulatory Visit: Payer: Self-pay | Admitting: Cardiology

## 2011-03-21 ENCOUNTER — Encounter: Payer: Self-pay | Admitting: Critical Care Medicine

## 2011-03-21 ENCOUNTER — Ambulatory Visit (INDEPENDENT_AMBULATORY_CARE_PROVIDER_SITE_OTHER): Payer: Medicare Other | Admitting: Critical Care Medicine

## 2011-03-21 VITALS — BP 120/88 | HR 99 | Temp 97.4°F | Ht 70.0 in | Wt 247.4 lb

## 2011-03-21 DIAGNOSIS — J438 Other emphysema: Secondary | ICD-10-CM

## 2011-03-21 DIAGNOSIS — J439 Emphysema, unspecified: Secondary | ICD-10-CM

## 2011-03-21 MED ORDER — LEVOFLOXACIN 750 MG PO TABS: 750.0000 mg | ORAL_TABLET | Freq: Every day | ORAL | Status: AC

## 2011-03-21 MED ORDER — PREDNISONE 10 MG PO TABS: ORAL_TABLET | ORAL | Status: DC

## 2011-03-21 NOTE — Progress Notes (Signed)
Subjective:    Patient ID: [name], male    DOB: [birth year], 71 y.o.   MRN: [id number]  HPI  [name] is a 71 y.o.  white male, history of chronic obstructive  lung disease, primary emphysematous component    10/15/10 Since her last visit the patient still has significant secretions. The cough is productive of clear mucus. The patient's dyspnea continues with exertion. The patient is now on oxygen with exertion but is doing well with oxygen at night. The patient maintain Symbicort and Spiriva. Pt denies any significant sore throat, nasal congestion or excess secretions, fever, chills, sweats, unintended weight loss, pleurtic or exertional chest pain, orthopnea PND, or leg swelling Pt denies any increase in rescue therapy over baseline, denies waking up needing it or having any early am or nocturnal exacerbations of coughing/wheezing/or dyspnea. Pt also denies any obvious fluctuation in symptoms with  weather or environmental change or other alleviating or aggravating factors  12/7 At last ov we [id number] Symbicort and Spiriva use Increase prednisone and then taper to 10 mg a day and stay Flu vaccine will be administered Continue oxygen at night and as needed during the daytime Pred helped and flutter valve helps and uses twice daily.  Coughs up clear mucus. No real chest pain.  Notes more heartburn last two weeks.   03/21/2011 Pt became ill with cough and dyspnea. Pt developed more symptoms one week ago and pcp rx levaquin 750/d x 5 days Pt is some better but still weak Past Medical History  Diagnosis Date  . COPD with emphysema     chronic Dyspnea, followed by Dr. Dennie Bible. Maurene Hollin, oxygen at night started 11/2008  . Atrial fibrillation     Permanent  . Warfarin anticoagulation     Coumadin  . Right ventricular dysfunction     Right Ventricle, mild moderately dilated by Echo 06/2008  . CAD (coronary artery disease)     .MI with a Taxus stent in 2004..  /   nuclear... June,  2010.. small scar or slight ischemia inferior wall  . Diabetes mellitus   . Hyperlipemia   . Obesity   . Peripheral edema     Mild  . Hypertension     relative hypotension... August, 2011... Cozaar stopped  . Foot pain     Significant neuropathy has been evaluated at [facility].     No family history on file.   History   Social History  . Marital Status: Married    Spouse Name: N/A    Number of Children: N/A  . Years of Education: N/A   Occupational History  . Dance movement psychotherapist    Social History Main Topics  . Smoking status: Former Smoker -- 2.0 packs/day for 40 years    Types: Cigarettes    Quit date: 01/06/1998  . Smokeless tobacco: Never Used  . Alcohol Use: Not on file  . Drug Use: Not on file  . Sexually Active: Not on file   Other Topics Concern  . Not on file   Social History Narrative  . No narrative on file     Allergies  Allergen Reactions  . Clopidogrel Bisulfate     REACTION: rash     Outpatient Prescriptions Prior to Visit  Medication Sig Dispense Refill  . albuterol (VENTOLIN HFA) 108 (90 BASE) MCG/ACT inhaler Inhale 2 puffs into the lungs every 6 (six) hours as needed for wheezing.  1 Inhaler  6  . Ascorbic Acid (VITAMIN C)  500 MG tablet Take 500 mg by mouth daily.        Marland Kitchen aspirin 81 MG tablet Take 81 mg by mouth daily.        . furosemide (LASIX) 80 MG tablet TAKE ONE TABLET BY MOUTH TWICE DAILY  60 tablet  4  . gabapentin (NEURONTIN) 300 MG capsule Take 4 tablets by mouth two times a day.       Marland Kitchen KLOR-CON M20 20 MEQ tablet TAKE 1 TABLET ONCE DAILY  30 each  6  . lovastatin (MEVACOR) 40 MG tablet Take 40 mg by mouth at bedtime.        . metFORMIN (GLUCOPHAGE) 500 MG tablet Take 500 mg by mouth 2 (two) times daily.       . metoprolol (TOPROL-XL) 50 MG 24 hr tablet Take 1/2 tablet by mouth once daily      . Multiple Vitamins-Minerals (CENTRUM PO) Take 1 tablet by mouth daily.        . Omega-3 Fatty Acids (FISH OIL) 1000 MG  CAPS Take 1 capsule by mouth daily.        Marland Kitchen Respiratory Therapy Supplies (FLUTTER) DEVI Use 4 times daily  1 each  0  . SPIRIVA HANDIHALER 18 MCG inhalation capsule PLACE ONE CAPSULE IN MOUTHPIECE (HANDIHALER) AND INHALE IN MOUTH ONCEA DAY.  30 each  6  . SYMBICORT 160-4.5 MCG/ACT inhaler INHALE 2 PUFFS INTO MOUTHTWICE DAILY  1 Inhaler  11  . warfarin (COUMADIN) 5 MG tablet TAKE AS DIRECTED  45 tablet  3  . predniSONE (DELTASONE) 10 MG tablet Take 10 mg by mouth daily.            Review of Systems  Constitutional:   No  weight loss, night sweats,  Fevers, chills, fatigue, lassitude. HEENT:   No headaches,  Difficulty swallowing,  Tooth/dental problems,  Sore throat,                No sneezing, itching, ear ache, nasal congestion, post nasal drip,   CV:  No chest pain,  Orthopnea, PND, swelling in lower extremities, anasarca, dizziness, palpitations  GI  No heartburn, indigestion, abdominal pain, nausea, vomiting, diarrhea, change in bowel habits, loss of appetite  Resp: Notes  shortness of breath with exertion not at rest.  No excess mucus, notes white mucus,  productive cough,  No non-productive cough,  No coughing up of blood.  No change in color of mucus.  No wheezing.  No chest wall deformity  Skin: no rash or lesions.  GU: no dysuria, change in color of urine, no urgency or frequency.  No flank pain.  MS:  No joint pain or swelling.  No decreased range of motion.  No back pain.  Psych:  No change in mood or affect. No depression or anxiety.  No memory loss.     Objective:   Physical Exam  Filed Vitals:   03/21/11 0927 03/21/11 0928  BP: 120/88   Pulse: 99   Temp: 97.4 F (36.3 C)   TempSrc: Oral   Height: 5\' 10"  (1.778 m)   Weight: 247 lb 6.4 oz (112.22 kg)   SpO2: 88% 96%    Gen: Pleasant, well-nourished, in no distress,  normal affect  ENT: No lesions,  mouth clear,  oropharynx clear, no postnasal drip  Neck: No JVD, no TMG, no carotid bruits  Lungs: No use  of accessory muscles, no dullness to percussion, distant BS, coarse rhonchi  Cardiovascular: RRR, heart sounds normal, no  murmur or gallops, no peripheral edema  Abdomen: soft and NT, no HSM,  BS normal  Musculoskeletal: No deformities, no cyanosis or clubbing  Neuro: alert, non focal  Skin: Warm, no lesions or rashes        Assessment & Plan:   COPD with emphysema Chronic obstructive lung disease with acute bronchitic flare Patient is at least gold stage C. Plan Start Levaquin one daily for 7days Prednisone 10mg  Take 4 for three days 3 for three days 2 for three days then one daily and stay No other medication changes Return 6 weeks      Updated Medication List Outpatient Encounter Prescriptions as of 03/21/2011  Medication Sig Dispense Refill  . albuterol (VENTOLIN HFA) 108 (90 BASE) MCG/ACT inhaler Inhale 2 puffs into the lungs every 6 (six) hours as needed for wheezing.  1 Inhaler  6  . Ascorbic Acid (VITAMIN C) 500 MG tablet Take 500 mg by mouth daily.        Marland Kitchen aspirin 81 MG tablet Take 81 mg by mouth daily.        . furosemide (LASIX) 80 MG tablet TAKE ONE TABLET BY MOUTH TWICE DAILY  60 tablet  4  . gabapentin (NEURONTIN) 300 MG capsule Take 4 tablets by mouth two times a day.       Marland Kitchen KLOR-CON M20 20 MEQ tablet TAKE 1 TABLET ONCE DAILY  30 each  6  . lovastatin (MEVACOR) 40 MG tablet Take 40 mg by mouth at bedtime.        . metFORMIN (GLUCOPHAGE) 500 MG tablet Take 500 mg by mouth 2 (two) times daily.       . metoprolol (TOPROL-XL) 50 MG 24 hr tablet Take 1/2 tablet by mouth once daily      . Multiple Vitamins-Minerals (CENTRUM PO) Take 1 tablet by mouth daily.        Marland Kitchen NITROSTAT 0.4 MG SL tablet as directed.      . Omega-3 Fatty Acids (FISH OIL) 1000 MG CAPS Take 1 capsule by mouth daily.        . predniSONE (DELTASONE) 10 MG tablet Take 4 for three days 3 for three days 2 for three days then one daily and stay  40 tablet  6  . Respiratory Therapy Supplies  (FLUTTER) DEVI Use 4 times daily  1 each  0  . SPIRIVA HANDIHALER 18 MCG inhalation capsule PLACE ONE CAPSULE IN MOUTHPIECE (HANDIHALER) AND INHALE IN MOUTH ONCEA DAY.  30 each  6  . SYMBICORT 160-4.5 MCG/ACT inhaler INHALE 2 PUFFS INTO MOUTHTWICE DAILY  1 Inhaler  11  . warfarin (COUMADIN) 5 MG tablet TAKE AS DIRECTED  45 tablet  3  . DISCONTD: predniSONE (DELTASONE) 10 MG tablet Take 10 mg by mouth daily.        Marland Kitchen levofloxacin (LEVAQUIN) 750 MG tablet Take 1 tablet (750 mg total) by mouth daily.  7 tablet  0

## 2011-03-21 NOTE — Assessment & Plan Note (Signed)
Chronic obstructive lung disease with acute bronchitic flare Patient is at least gold stage C. Plan Start Levaquin one daily for 7days Prednisone 10mg  Take 4 for three days 3 for three days 2 for three days then one daily and stay No other medication changes Return 6 weeks

## 2011-03-21 NOTE — Patient Instructions (Signed)
Start Levaquin one daily for 7days Prednisone 10mg  Take 4 for three days 3 for three days 2 for three days then one daily and stay No other medication changes Return 6 weeks

## 2011-04-08 ENCOUNTER — Other Ambulatory Visit: Payer: Self-pay | Admitting: Cardiology

## 2011-04-16 ENCOUNTER — Other Ambulatory Visit: Payer: Self-pay | Admitting: Critical Care Medicine

## 2011-04-17 ENCOUNTER — Encounter: Payer: Self-pay | Admitting: Cardiology

## 2011-04-17 ENCOUNTER — Ambulatory Visit (INDEPENDENT_AMBULATORY_CARE_PROVIDER_SITE_OTHER): Payer: Medicare Other | Admitting: Cardiology

## 2011-04-17 VITALS — BP 125/87 | HR 95 | Ht 69.0 in | Wt 250.0 lb

## 2011-04-17 DIAGNOSIS — I2510 Atherosclerotic heart disease of native coronary artery without angina pectoris: Secondary | ICD-10-CM

## 2011-04-17 DIAGNOSIS — J439 Emphysema, unspecified: Secondary | ICD-10-CM

## 2011-04-17 DIAGNOSIS — I4891 Unspecified atrial fibrillation: Secondary | ICD-10-CM

## 2011-04-17 DIAGNOSIS — R609 Edema, unspecified: Secondary | ICD-10-CM

## 2011-04-17 DIAGNOSIS — J438 Other emphysema: Secondary | ICD-10-CM

## 2011-04-17 NOTE — Assessment & Plan Note (Signed)
Coronary disease is stable. No further workup at this time. 

## 2011-04-17 NOTE — Assessment & Plan Note (Signed)
The patient was really scared at the time of his last flare of his pulmonary disease. He is improving with the medications. He has gained fluid weight. He'll be seeing Dr. Delford Field back in the office soon.

## 2011-04-17 NOTE — Patient Instructions (Addendum)
Follow up in 3 months. Take Lasix (furosemide) 120 mg (1 1/2 tablets) every morning and 80 mg every evening for 2 days and then decrease back to 80 mg two times a day.  Decrease your salt and fluid intake (including water).

## 2011-04-17 NOTE — Progress Notes (Signed)
HPI Patient is seen for followup of coronary disease and atrial fibrillation. He has significant lung disease. He has had some worsening recently that's been treated well by the pulmonary team. He has received some steroids. His fluid volume status is somewhat increased and this may be partially related to his steroids. I want to increase his diuretics. He is hesitant because he spends a good bit of the night urinating. I explained to him that it is crucial that he watches salt but also cut back his fluid intake. He does admit to drinking a lot of extra fluid particularly water.  Allergies  Allergen Reactions  . Clopidogrel Bisulfate     REACTION: rash    Current Outpatient Prescriptions  Medication Sig Dispense Refill  . albuterol (VENTOLIN HFA) 108 (90 BASE) MCG/ACT inhaler Inhale 2 puffs into the lungs every 6 (six) hours as needed for wheezing.  1 Inhaler  6  . Ascorbic Acid (VITAMIN C) 500 MG tablet Take 500 mg by mouth daily.        Marland Kitchen aspirin 81 MG tablet Take 81 mg by mouth daily.        . furosemide (LASIX) 80 MG tablet TAKE ONE TABLET BY MOUTH TWICE DAILY  60 tablet  4  . gabapentin (NEURONTIN) 300 MG capsule Take 4 tablets by mouth two times a day.       . lovastatin (MEVACOR) 40 MG tablet Take 40 mg by mouth at bedtime.        . metFORMIN (GLUCOPHAGE) 500 MG tablet Take 500 mg by mouth 2 (two) times daily.       . metoprolol (TOPROL-XL) 50 MG 24 hr tablet Take 1/2 tablet by mouth once daily      . Multiple Vitamins-Minerals (CENTRUM PO) Take 1 tablet by mouth daily.        Marland Kitchen NITROSTAT 0.4 MG SL tablet Place 0.4 mg under the tongue every 5 (five) minutes as needed.       . Omega-3 Fatty Acids (FISH OIL) 1000 MG CAPS Take 1 capsule by mouth daily.        . potassium chloride SA (K-DUR,KLOR-CON) 20 MEQ tablet TAKE 1 TABLET ONCE DAILY  30 tablet  6  . predniSONE (DELTASONE) 10 MG tablet Take 4 for three days 3 for three days 2 for three days then one daily and stay  40 tablet  6  .  Respiratory Therapy Supplies (FLUTTER) DEVI Use 4 times daily  1 each  0  . SPIRIVA HANDIHALER 18 MCG inhalation capsule PLACE ONE CAPSULE IN MOUTHPIECE (HANDIHALER) AND INHALE IN MOUTH ONCEA DAY.  30 each  6  . SYMBICORT 160-4.5 MCG/ACT inhaler INHALE 2 PUFFS INTO MOUTH TWICE DAILY  1 Inhaler  6  . warfarin (COUMADIN) 5 MG tablet TAKE AS DIRECTED  45 tablet  3    History   Social History  . Marital Status: Married    Spouse Name: N/A    Number of Children: N/A  . Years of Education: N/A   Occupational History  . Dance movement psychotherapist    Social History Main Topics  . Smoking status: Former Smoker -- 2.0 packs/day for 40 years    Types: Cigarettes    Quit date: 01/06/1998  . Smokeless tobacco: Never Used  . Alcohol Use: Not on file  . Drug Use: Not on file  . Sexually Active: Not on file   Other Topics Concern  . Not on file   Social History Narrative  .  No narrative on file    No family history on file.  Past Medical History  Diagnosis Date  . COPD with emphysema     chronic Dyspnea, followed by Dr. Dennie Bible. Wright, oxygen at night started 11/2008  . Atrial fibrillation     Permanent  . Warfarin anticoagulation     Coumadin  . Right ventricular dysfunction     Right Ventricle, mild moderately dilated by Echo 06/2008  . CAD (coronary artery disease)     .MI with a Taxus stent in 2004..  /   nuclear... June, 2010.. small scar or slight ischemia inferior wall  . Diabetes mellitus   . Hyperlipemia   . Obesity   . Peripheral edema     Mild  . Hypertension     relative hypotension... August, 2011... Cozaar stopped  . Foot pain     Significant neuropathy has been evaluated at [facility].    Past Surgical History  Procedure Date  . Rotator cuff repair     x 3  . Knee surgery     ROS Patient denies fever, chills, headache, sweats, rash, change in vision, change in hearing, chest pain, nausea vomiting, urinary symptoms. All other systems are reviewed  and are negative.  PHYSICAL EXAM  Patient's oriented to person time and place. Affect is normal. He is overweight. Lung exam reveals a few scattered rhonchi and wheezes. He has no respiratory distress at this time. Cardiac exam reveals S1-S2. There no clicks or significant murmurs. The abdomen is protuberant. He does have 1+ peripheral edema. His weight is increased 9 pounds since I saw him in September, 2012.  Filed Vitals:   04/17/11 1411  BP: 125/87  Pulse: 95  Height: 5\' 9"  (1.753 m)  Weight: 250 lb (113.399 kg)  SpO2: 95%   EKG is done and reviewed by me today. There is nonspecific ST-T wave changes. He has atrial fibrillation. His underlying rate is approximately 100.  ASSESSMENT & PLAN

## 2011-04-17 NOTE — Assessment & Plan Note (Signed)
We need to be careful to watch his atrial fib rate. It is slightly elevated at rest today. With his ongoing fluid overload and pulmonary problems I'm not inclined to change his rate control meds at this time. However over time we may need to adjust things further. He is on Coumadin.

## 2011-04-17 NOTE — Assessment & Plan Note (Signed)
The patient does have some increased fluid at this time. I had a very careful discussion with him about his salt intake and about trying to limit his overall fluid intake. If he can do this we may not have to push his diuretics higher. He is very hesitant to have higher diuretic doses because he already spent a good bit of the night in the bathroom. If he cuts back his fluids this will probably improve. I did ask him to increase his morning dose of Lasix to 120 mg in the morning for the next 2 days. If he has a good response to this it may be necessary for him to take this extra dose intermittently in the morning.

## 2011-04-18 ENCOUNTER — Ambulatory Visit (INDEPENDENT_AMBULATORY_CARE_PROVIDER_SITE_OTHER): Payer: Medicare Other | Admitting: *Deleted

## 2011-04-18 DIAGNOSIS — I4891 Unspecified atrial fibrillation: Secondary | ICD-10-CM

## 2011-04-18 DIAGNOSIS — Z7901 Long term (current) use of anticoagulants: Secondary | ICD-10-CM

## 2011-04-18 LAB — POCT INR: INR: 3.2

## 2011-04-23 ENCOUNTER — Encounter: Payer: Self-pay | Admitting: Critical Care Medicine

## 2011-04-23 ENCOUNTER — Ambulatory Visit (INDEPENDENT_AMBULATORY_CARE_PROVIDER_SITE_OTHER): Payer: Medicare Other | Admitting: Critical Care Medicine

## 2011-04-23 VITALS — BP 118/78 | HR 94 | Temp 97.3°F | Ht 69.0 in | Wt 252.6 lb

## 2011-04-23 DIAGNOSIS — J439 Emphysema, unspecified: Secondary | ICD-10-CM

## 2011-04-23 DIAGNOSIS — J438 Other emphysema: Secondary | ICD-10-CM

## 2011-04-23 MED ORDER — ROFLUMILAST 500 MCG PO TABS: 500.0000 ug | ORAL_TABLET | Freq: Every day | ORAL | Status: DC

## 2011-04-23 NOTE — Patient Instructions (Signed)
Start Daliresp one tablet daily, if you get nausea or side effects, try this every other day.  If you tolerate this call us in 2-3 weeks for a refill No other medication changes Return 2 months

## 2011-04-23 NOTE — Progress Notes (Signed)
Subjective:    Patient ID: [name], male    DOB: 1940/07/09, 71 y.o.   MRN: [id number]  HPI  Mr. Tallman is a 71 y.o.  white male, history of chronic obstructive  lung disease, primary emphysematous component  04/23/2011 At last ov we rx pred/levaquin extension.  Now is better.  Not much mucus.  Shortness of breath is better.  Min edema in feet.  No heartburn or indigestion. Pt denies any significant sore throat, nasal congestion or excess secretions, fever, chills, sweats, unintended weight loss, pleurtic or exertional chest pain, orthopnea PND, or leg swelling Pt denies any increase in rescue therapy over baseline, denies waking up needing it or having any early am or nocturnal exacerbations of coughing/wheezing/or dyspnea. Pt also denies any obvious fluctuation in symptoms with  weather or environmental change or other alleviating or aggravating factors    Past Medical History  Diagnosis Date  . COPD with emphysema     chronic Dyspnea, followed by Dr. Dennie Bible. Gita Dilger, oxygen at night started 11/2008  . Atrial fibrillation     Permanent  . Warfarin anticoagulation     Coumadin  . Right ventricular dysfunction     Right Ventricle, mild moderately dilated by Echo 06/2008  . CAD (coronary artery disease)     .MI with a Taxus stent in 2004..  /   nuclear... June, 2010.. small scar or slight ischemia inferior wall  . Diabetes mellitus   . Hyperlipemia   . Obesity   . Peripheral edema     Mild  . Hypertension     relative hypotension... August, 2011... Cozaar stopped  . Foot pain     Significant neuropathy has been evaluated at [facility].     No family history on file.   History   Social History  . Marital Status: Married    Spouse Name: N/A    Number of Children: N/A  . Years of Education: N/A   Occupational History  . Dance movement psychotherapist    Social History Main Topics  . Smoking status: Former Smoker -- 2.0 packs/day for 40 years    Types:  Cigarettes    Quit date: 01/06/1998  . Smokeless tobacco: Never Used  . Alcohol Use: Not on file  . Drug Use: Not on file  . Sexually Active: Not on file   Other Topics Concern  . Not on file   Social History Narrative  . No narrative on file     Allergies  Allergen Reactions  . Clopidogrel Bisulfate     REACTION: rash     Outpatient Prescriptions Prior to Visit  Medication Sig Dispense Refill  . albuterol (VENTOLIN HFA) 108 (90 BASE) MCG/ACT inhaler Inhale 2 puffs into the lungs every 6 (six) hours as needed for wheezing.  1 Inhaler  6  . Ascorbic Acid (VITAMIN C) 500 MG tablet Take 500 mg by mouth daily.        Marland Kitchen aspirin 81 MG tablet Take 81 mg by mouth daily.        . furosemide (LASIX) 80 MG tablet TAKE ONE TABLET BY MOUTH TWICE DAILY  60 tablet  4  . gabapentin (NEURONTIN) 300 MG capsule Take 4 tablets by mouth two times a day.       . lovastatin (MEVACOR) 40 MG tablet Take 40 mg by mouth at bedtime.        . metFORMIN (GLUCOPHAGE) 500 MG tablet Take 500 mg by mouth 2 (two) times daily.       Marland Kitchen  metoprolol (TOPROL-XL) 50 MG 24 hr tablet Take 1/2 tablet by mouth once daily      . Multiple Vitamins-Minerals (CENTRUM PO) Take 1 tablet by mouth daily.        Marland Kitchen NITROSTAT 0.4 MG SL tablet Place 0.4 mg under the tongue every 5 (five) minutes as needed.       . Omega-3 Fatty Acids (FISH OIL) 1000 MG CAPS Take 1 capsule by mouth daily.        . potassium chloride SA (K-DUR,KLOR-CON) 20 MEQ tablet TAKE 1 TABLET ONCE DAILY  30 tablet  6  . Respiratory Therapy Supplies (FLUTTER) DEVI Use 4 times daily  1 each  0  . SPIRIVA HANDIHALER 18 MCG inhalation capsule PLACE ONE CAPSULE IN MOUTHPIECE (HANDIHALER) AND INHALE IN MOUTH ONCEA DAY.  30 each  6  . SYMBICORT 160-4.5 MCG/ACT inhaler INHALE 2 PUFFS INTO MOUTH TWICE DAILY  1 Inhaler  6  . warfarin (COUMADIN) 5 MG tablet TAKE AS DIRECTED  45 tablet  3  . predniSONE (DELTASONE) 10 MG tablet Take 4 for three days 3 for three days 2 for three  days then one daily and stay  40 tablet  6      Review of Systems  Constitutional:   No  weight loss, night sweats,  Fevers, chills, fatigue, lassitude. HEENT:   No headaches,  Difficulty swallowing,  Tooth/dental problems,  Sore throat,                No sneezing, itching, ear ache, nasal congestion, post nasal drip,   CV:  No chest pain,  Orthopnea, PND, swelling in lower extremities, anasarca, dizziness, palpitations  GI  No heartburn, indigestion, abdominal pain, nausea, vomiting, diarrhea, change in bowel habits, loss of appetite  Resp: Notes  shortness of breath with exertion not at rest.  No excess mucus, notes  white mucus,  productive cough,  No non-productive cough,  No coughing up of blood.  No change in color of mucus.  No wheezing.  No chest wall deformity  Skin: no rash or lesions.  GU: no dysuria, change in color of urine, no urgency or frequency.  No flank pain.  MS:  No joint pain or swelling.  No decreased range of motion.  No back pain.  Psych:  No change in mood or affect. No depression or anxiety.  No memory loss.     Objective:   Physical Exam  Filed Vitals:   04/23/11 1141  BP: 118/78  Pulse: 94  Temp: 97.3 F (36.3 C)  TempSrc: Oral  Height: 5\' 9"  (1.753 m)  Weight: 252 lb 9.6 oz (114.579 kg)  SpO2: 97%    Gen: Pleasant, well-nourished, in no distress,  normal affect  ENT: No lesions,  mouth clear,  oropharynx clear, no postnasal drip  Neck: No JVD, no TMG, no carotid bruits  Lungs: No use of accessory muscles, no dullness to percussion, distant BS  Cardiovascular: RRR, heart sounds normal, no murmur or gallops, no peripheral edema  Abdomen: soft and NT, no HSM,  BS normal  Musculoskeletal: No deformities, no cyanosis or clubbing  Neuro: alert, non focal  Skin: Warm, no lesions or rashes        Assessment & Plan:   COPD with emphysema Golds stage C COPD with frequent exacerbations Plan Begin Daliresp daily No change in  inhaled medications Continue oxygen at bedtime     Updated Medication List Outpatient Encounter Prescriptions as of 04/23/2011  Medication Sig Dispense  Refill  . albuterol (VENTOLIN HFA) 108 (90 BASE) MCG/ACT inhaler Inhale 2 puffs into the lungs every 6 (six) hours as needed for wheezing.  1 Inhaler  6  . Ascorbic Acid (VITAMIN C) 500 MG tablet Take 500 mg by mouth daily.        Marland Kitchen aspirin 81 MG tablet Take 81 mg by mouth daily.        . furosemide (LASIX) 80 MG tablet TAKE ONE TABLET BY MOUTH TWICE DAILY  60 tablet  4  . gabapentin (NEURONTIN) 300 MG capsule Take 4 tablets by mouth two times a day.       . lovastatin (MEVACOR) 40 MG tablet Take 40 mg by mouth at bedtime.        . metFORMIN (GLUCOPHAGE) 500 MG tablet Take 500 mg by mouth 2 (two) times daily.       . metoprolol (TOPROL-XL) 50 MG 24 hr tablet Take 1/2 tablet by mouth once daily      . Multiple Vitamins-Minerals (CENTRUM PO) Take 1 tablet by mouth daily.        Marland Kitchen NITROSTAT 0.4 MG SL tablet Place 0.4 mg under the tongue every 5 (five) minutes as needed.       . Omega-3 Fatty Acids (FISH OIL) 1000 MG CAPS Take 1 capsule by mouth daily.        . potassium chloride SA (K-DUR,KLOR-CON) 20 MEQ tablet TAKE 1 TABLET ONCE DAILY  30 tablet  6  . predniSONE (DELTASONE) 10 MG tablet Take 10 mg by mouth daily.      Marland Kitchen Respiratory Therapy Supplies (FLUTTER) DEVI Use 4 times daily  1 each  0  . SPIRIVA HANDIHALER 18 MCG inhalation capsule PLACE ONE CAPSULE IN MOUTHPIECE (HANDIHALER) AND INHALE IN MOUTH ONCEA DAY.  30 each  6  . SYMBICORT 160-4.5 MCG/ACT inhaler INHALE 2 PUFFS INTO MOUTH TWICE DAILY  1 Inhaler  6  . warfarin (COUMADIN) 5 MG tablet TAKE AS DIRECTED  45 tablet  3  . DISCONTD: predniSONE (DELTASONE) 10 MG tablet Take 4 for three days 3 for three days 2 for three days then one daily and stay  40 tablet  6  . roflumilast (DALIRESP) 500 MCG TABS tablet Take 1 tablet (500 mcg total) by mouth daily.  30 tablet  0

## 2011-04-23 NOTE — Assessment & Plan Note (Signed)
Golds stage C COPD with frequent exacerbations Plan Begin Daliresp daily No change in inhaled medications Continue oxygen at bedtime

## 2011-05-16 ENCOUNTER — Other Ambulatory Visit: Payer: Self-pay | Admitting: Critical Care Medicine

## 2011-05-16 ENCOUNTER — Ambulatory Visit (INDEPENDENT_AMBULATORY_CARE_PROVIDER_SITE_OTHER): Payer: Medicare Other | Admitting: *Deleted

## 2011-05-16 DIAGNOSIS — Z7901 Long term (current) use of anticoagulants: Secondary | ICD-10-CM

## 2011-05-16 DIAGNOSIS — I4891 Unspecified atrial fibrillation: Secondary | ICD-10-CM

## 2011-05-16 LAB — POCT INR: INR: 3

## 2011-06-10 ENCOUNTER — Telehealth: Payer: Self-pay | Admitting: Critical Care Medicine

## 2011-06-10 NOTE — Telephone Encounter (Signed)
Pt is coming in and see PW on 06/11/11

## 2011-06-10 NOTE — Telephone Encounter (Signed)
I spoke with [name] and he states he is very SOB w/ very little activity and feels like he can't breathe just walking to the bathroom x 3-4 days. [name] requesting an apt to be seen tomorrow but only wants to see PW if possible. Dr. Delford Field is it okay to [facility] you tomorrow, please advise thanks

## 2011-06-10 NOTE — Telephone Encounter (Signed)
Yes, overbook

## 2011-06-11 ENCOUNTER — Ambulatory Visit (INDEPENDENT_AMBULATORY_CARE_PROVIDER_SITE_OTHER): Payer: Medicare Other | Admitting: Critical Care Medicine

## 2011-06-11 ENCOUNTER — Encounter: Payer: Self-pay | Admitting: Critical Care Medicine

## 2011-06-11 VITALS — BP 118/78 | HR 106 | Temp 97.5°F | Ht 69.0 in | Wt 246.6 lb

## 2011-06-11 DIAGNOSIS — J438 Other emphysema: Secondary | ICD-10-CM

## 2011-06-11 DIAGNOSIS — J439 Emphysema, unspecified: Secondary | ICD-10-CM

## 2011-06-11 MED ORDER — PREDNISONE 10 MG PO TABS: ORAL_TABLET | ORAL | Status: DC

## 2011-06-11 MED ORDER — AZITHROMYCIN 250 MG PO TABS: 250.0000 mg | ORAL_TABLET | Freq: Every day | ORAL | Status: AC

## 2011-06-11 NOTE — Progress Notes (Signed)
Subjective:    Patient ID: [name], male    DOB: 06-Mar-1940, 71 y.o.   MRN: [id number]  HPI  [name] is a 71 y.o.  white male, history of chronic obstructive  lung disease, primary emphysematous component  04/23/2011 At last ov we rx pred/levaquin extension.  Now is better.  Not much mucus.  Shortness of breath is better.  Min edema in feet.  No heartburn or indigestion. Pt denies any significant sore throat, nasal congestion or excess secretions, fever, chills, sweats, unintended weight loss, pleurtic or exertional chest pain, orthopnea PND, or leg swelling Pt denies any increase in rescue therapy over baseline, denies waking up needing it or having any early am or nocturnal exacerbations of coughing/wheezing/or dyspnea. Pt also denies any obvious fluctuation in symptoms with  weather or environmental change or other alleviating or aggravating factors  06/11/2011 Pt here for recurrent exacerbation of Copd.  Pt gradually worse 3-4 days.  Not as bad as 04/2011   Mucus is copious white not hard to raise.  No chest pain.  No GERD symptoms.  Not sore in throat. More dyspneic with min exertion, even at rest. Pt did not tolerate Daliresp d/t nausea.   Past Medical History  Diagnosis Date  . COPD with emphysema     chronic Dyspnea, followed by Dr. Dennie Bible. [name], oxygen at night started 11/2008  . Atrial fibrillation     Permanent  . Warfarin anticoagulation     Coumadin  . Right ventricular dysfunction     Right Ventricle, mild moderately dilated by Echo 06/2008  . CAD (coronary artery disease)     .MI with a Taxus stent in 2004..  /   nuclear... June, 2010.. small scar or slight ischemia inferior wall  . Diabetes mellitus   . Hyperlipemia   . Obesity   . Peripheral edema     Mild  . Hypertension     relative hypotension... August, 2011... Cozaar stopped  . Foot pain     Significant neuropathy has been evaluated at [facility].     No family history on  file.   History   Social History  . Marital Status: Married    Spouse Name: N/A    Number of Children: N/A  . Years of Education: N/A   Occupational History  . Dance movement psychotherapist    Social History Main Topics  . Smoking status: Former Smoker -- 2.0 packs/day for 40 years    Types: Cigarettes    Quit date: 01/06/1998  . Smokeless tobacco: Never Used  . Alcohol Use: Not on file  . Drug Use: Not on file  . Sexually Active: Not on file   Other Topics Concern  . Not on file   Social History Narrative  . No narrative on file     Allergies  Allergen Reactions  . Clopidogrel Bisulfate     REACTION: rash     Outpatient Prescriptions Prior to Visit  Medication Sig Dispense Refill  . albuterol (VENTOLIN HFA) 108 (90 BASE) MCG/ACT inhaler Inhale 2 puffs into the lungs every 6 (six) hours as needed for wheezing.  1 Inhaler  6  . Ascorbic Acid (VITAMIN C) 500 MG tablet Take 500 mg by mouth daily.        Marland Kitchen aspirin 81 MG tablet Take 81 mg by mouth daily.        . furosemide (LASIX) 80 MG tablet TAKE ONE TABLET BY MOUTH TWICE DAILY  60 tablet  4  . gabapentin (NEURONTIN) 300 MG capsule Take 4 tablets by mouth two times a day.       . lovastatin (MEVACOR) 40 MG tablet Take 40 mg by mouth at bedtime.        . metFORMIN (GLUCOPHAGE) 500 MG tablet Take 500 mg by mouth 2 (two) times daily.       . metoprolol (TOPROL-XL) 50 MG 24 hr tablet Take 1/2 tablet by mouth once daily      . Multiple Vitamins-Minerals (CENTRUM PO) Take 1 tablet by mouth daily.        Marland Kitchen NITROSTAT 0.4 MG SL tablet Place 0.4 mg under the tongue every 5 (five) minutes as needed.       . Omega-3 Fatty Acids (FISH OIL) 1000 MG CAPS Take 1 capsule by mouth daily.        . potassium chloride SA (K-DUR,KLOR-CON) 20 MEQ tablet TAKE 1 TABLET ONCE DAILY  30 tablet  6  . Respiratory Therapy Supplies (FLUTTER) DEVI Use 4 times daily  1 each  0  . SPIRIVA HANDIHALER 18 MCG inhalation capsule PLACE ONE CAPSULE IN MOUTHPIECE  (HANDIHALER) AND INHALE IN MOUTH ONCE A DAY.  30 each  6  . SYMBICORT 160-4.5 MCG/ACT inhaler INHALE 2 PUFFS INTO MOUTH TWICE DAILY  1 Inhaler  6  . warfarin (COUMADIN) 5 MG tablet TAKE AS DIRECTED  45 tablet  3  . predniSONE (DELTASONE) 10 MG tablet Take 10 mg by mouth daily.      . roflumilast (DALIRESP) 500 MCG TABS tablet Take 1 tablet (500 mcg total) by mouth daily.  30 tablet  0      Review of Systems  Constitutional:   No  weight loss, night sweats,  Fevers, chills, fatigue, lassitude. HEENT:   No headaches,  Difficulty swallowing,  Tooth/dental problems,  Sore throat,                No sneezing, itching, ear ache, nasal congestion, post nasal drip,   CV:  No chest pain,  Orthopnea, PND, swelling in lower extremities, anasarca, dizziness, palpitations  GI  No heartburn, indigestion, abdominal pain, nausea, vomiting, diarrhea, change in bowel habits, loss of appetite  Resp: Notes  shortness of breath with exertion not at rest.  No excess mucus, notes  white mucus,  productive cough,  No non-productive cough,  No coughing up of blood.  No change in color of mucus.  No wheezing.  No chest wall deformity  Skin: no rash or lesions.  GU: no dysuria, change in color of urine, no urgency or frequency.  No flank pain.  MS:  No joint pain or swelling.  No decreased range of motion.  No back pain.  Psych:  No change in mood or affect. No depression or anxiety.  No memory loss.     Objective:   Physical Exam  Filed Vitals:   06/11/11 0911  BP: 118/78  Pulse: 106  Temp: 97.5 F (36.4 C)  TempSrc: Oral  Height: 5\' 9"  (1.753 m)  Weight: 246 lb 9.6 oz (111.857 kg)  SpO2: 97%    Gen: Pleasant, well-nourished, in no distress,  normal affect  ENT: No lesions,  mouth clear,  oropharynx clear, no postnasal drip  Neck: No JVD, no TMG, no carotid bruits  Lungs: No use of accessory muscles, no dullness to percussion, distant BS, scattered rhonchi and expired  wheezes  Cardiovascular: RRR, heart sounds normal, no murmur or gallops, no peripheral edema  Abdomen:  soft and NT, no HSM,  BS normal  Musculoskeletal: No deformities, no cyanosis or clubbing  Neuro: alert, non focal  Skin: Warm, no lesions or rashes        Assessment & Plan:   COPD with emphysema Gold stage D. COPD with frequent exacerbations but Daliresp due  to adverse side effects Now with yet another exacerbation Plan Pulse prednisone 40 mg daily for 5 days then 10 mg daily thereafter Azithromycin for 5 days Maintain inhaled medications as prescribed Return 3 months     Updated Medication List Outpatient Encounter Prescriptions as of 06/11/2011  Medication Sig Dispense Refill  . albuterol (VENTOLIN HFA) 108 (90 BASE) MCG/ACT inhaler Inhale 2 puffs into the lungs every 6 (six) hours as needed for wheezing.  1 Inhaler  6  . Ascorbic Acid (VITAMIN C) 500 MG tablet Take 500 mg by mouth daily.        Marland Kitchen aspirin 81 MG tablet Take 81 mg by mouth daily.        . furosemide (LASIX) 80 MG tablet TAKE ONE TABLET BY MOUTH TWICE DAILY  60 tablet  4  . gabapentin (NEURONTIN) 300 MG capsule Take 4 tablets by mouth two times a day.       . lovastatin (MEVACOR) 40 MG tablet Take 40 mg by mouth at bedtime.        . metFORMIN (GLUCOPHAGE) 500 MG tablet Take 500 mg by mouth 2 (two) times daily.       . metoprolol (TOPROL-XL) 50 MG 24 hr tablet Take 1/2 tablet by mouth once daily      . Multiple Vitamins-Minerals (CENTRUM PO) Take 1 tablet by mouth daily.        Marland Kitchen NITROSTAT 0.4 MG SL tablet Place 0.4 mg under the tongue every 5 (five) minutes as needed.       . Omega-3 Fatty Acids (FISH OIL) 1000 MG CAPS Take 1 capsule by mouth daily.        . potassium chloride SA (K-DUR,KLOR-CON) 20 MEQ tablet TAKE 1 TABLET ONCE DAILY  30 tablet  6  . predniSONE (DELTASONE) 10 MG tablet Take 4 daily for 5 days then one daily  60 tablet  6  . Respiratory Therapy Supplies (FLUTTER) DEVI Use 4 times daily   1 each  0  . SPIRIVA HANDIHALER 18 MCG inhalation capsule PLACE ONE CAPSULE IN MOUTHPIECE (HANDIHALER) AND INHALE IN MOUTH ONCE A DAY.  30 each  6  . SYMBICORT 160-4.5 MCG/ACT inhaler INHALE 2 PUFFS INTO MOUTH TWICE DAILY  1 Inhaler  6  . warfarin (COUMADIN) 5 MG tablet TAKE AS DIRECTED  45 tablet  3  . DISCONTD: predniSONE (DELTASONE) 10 MG tablet Take 10 mg by mouth daily.      Marland Kitchen azithromycin (ZITHROMAX) 250 MG tablet Take 1 tablet (250 mg total) by mouth daily. Take two once then one daily until gone  6 each  0  . DISCONTD: roflumilast (DALIRESP) 500 MCG TABS tablet Take 1 tablet (500 mcg total) by mouth daily.  30 tablet  0

## 2011-06-11 NOTE — Assessment & Plan Note (Signed)
Gold stage D. COPD with frequent exacerbations but Daliresp due  to adverse side effects Now with yet another exacerbation Plan Pulse prednisone 40 mg daily for 5 days then 10 mg daily thereafter Azithromycin for 5 days Maintain inhaled medications as prescribed Return 3 months

## 2011-06-11 NOTE — Patient Instructions (Signed)
Azithromycin 250mg  Take two once then one daily until gone Prednisone 4 daily for 5 days then one daily (10mg  ) No other medication changes Return 3 months

## 2011-06-13 ENCOUNTER — Ambulatory Visit (INDEPENDENT_AMBULATORY_CARE_PROVIDER_SITE_OTHER): Payer: Medicare Other | Admitting: *Deleted

## 2011-06-13 DIAGNOSIS — I4891 Unspecified atrial fibrillation: Secondary | ICD-10-CM

## 2011-06-13 DIAGNOSIS — Z7901 Long term (current) use of anticoagulants: Secondary | ICD-10-CM

## 2011-06-13 LAB — POCT INR: INR: 3.2

## 2011-06-16 ENCOUNTER — Other Ambulatory Visit: Payer: Self-pay | Admitting: Cardiology

## 2011-06-16 DIAGNOSIS — R0602 Shortness of breath: Secondary | ICD-10-CM

## 2011-06-19 DIAGNOSIS — R0602 Shortness of breath: Secondary | ICD-10-CM

## 2011-06-20 DIAGNOSIS — I4891 Unspecified atrial fibrillation: Secondary | ICD-10-CM

## 2011-06-25 ENCOUNTER — Ambulatory Visit: Payer: Medicare Other | Admitting: Critical Care Medicine

## 2011-06-26 ENCOUNTER — Ambulatory Visit: Payer: Medicare Other | Admitting: Critical Care Medicine

## 2011-07-08 ENCOUNTER — Ambulatory Visit (INDEPENDENT_AMBULATORY_CARE_PROVIDER_SITE_OTHER): Payer: Medicare Other | Admitting: *Deleted

## 2011-07-08 ENCOUNTER — Ambulatory Visit (INDEPENDENT_AMBULATORY_CARE_PROVIDER_SITE_OTHER): Payer: Medicare Other | Admitting: Cardiology

## 2011-07-08 ENCOUNTER — Encounter: Payer: Self-pay | Admitting: Cardiology

## 2011-07-08 VITALS — BP 135/74 | HR 82 | Ht 69.0 in | Wt 235.0 lb

## 2011-07-08 DIAGNOSIS — R0989 Other specified symptoms and signs involving the circulatory and respiratory systems: Secondary | ICD-10-CM

## 2011-07-08 DIAGNOSIS — I519 Heart disease, unspecified: Secondary | ICD-10-CM

## 2011-07-08 DIAGNOSIS — E669 Obesity, unspecified: Secondary | ICD-10-CM

## 2011-07-08 DIAGNOSIS — R9430 Abnormal result of cardiovascular function study, unspecified: Secondary | ICD-10-CM | POA: Insufficient documentation

## 2011-07-08 DIAGNOSIS — Z889 Allergy status to unspecified drugs, medicaments and biological substances status: Secondary | ICD-10-CM | POA: Insufficient documentation

## 2011-07-08 DIAGNOSIS — J9600 Acute respiratory failure, unspecified whether with hypoxia or hypercapnia: Secondary | ICD-10-CM

## 2011-07-08 DIAGNOSIS — K5900 Constipation, unspecified: Secondary | ICD-10-CM | POA: Insufficient documentation

## 2011-07-08 DIAGNOSIS — Z7901 Long term (current) use of anticoagulants: Secondary | ICD-10-CM

## 2011-07-08 DIAGNOSIS — I272 Pulmonary hypertension, unspecified: Secondary | ICD-10-CM

## 2011-07-08 DIAGNOSIS — I4891 Unspecified atrial fibrillation: Secondary | ICD-10-CM

## 2011-07-08 DIAGNOSIS — J9690 Respiratory failure, unspecified, unspecified whether with hypoxia or hypercapnia: Secondary | ICD-10-CM

## 2011-07-08 DIAGNOSIS — J439 Emphysema, unspecified: Secondary | ICD-10-CM

## 2011-07-08 DIAGNOSIS — J9610 Chronic respiratory failure, unspecified whether with hypoxia or hypercapnia: Secondary | ICD-10-CM | POA: Insufficient documentation

## 2011-07-08 DIAGNOSIS — I2510 Atherosclerotic heart disease of native coronary artery without angina pectoris: Secondary | ICD-10-CM

## 2011-07-08 DIAGNOSIS — I2789 Other specified pulmonary heart diseases: Secondary | ICD-10-CM

## 2011-07-08 DIAGNOSIS — I10 Essential (primary) hypertension: Secondary | ICD-10-CM

## 2011-07-08 NOTE — Assessment & Plan Note (Signed)
Blood pressure is controlled. No change in therapy. 

## 2011-07-08 NOTE — Patient Instructions (Addendum)
Your physician recommends that you schedule a follow-up appointment in: 8 weeks with Dr. Myrtis Ser.  Your physician recommends that you continue on your current medications as directed. Please refer to the Current Medication list given to you today. Start metamucil 1 tablespoon with 4-6 oz of water daily.

## 2011-07-08 NOTE — Assessment & Plan Note (Signed)
Patient mentions today that he has significant constipation. I have encouraged him to start Metamucil daily and to be in touch with his primary physician

## 2011-07-08 NOTE — Assessment & Plan Note (Signed)
The patient does have moderate right ventricular dysfunction. His shortness of breath however is not on the basis of volume overload. It is related to his severe lung disease. There is moderate pulmonary hypertension.

## 2011-07-08 NOTE — Assessment & Plan Note (Signed)
The patient has lost from 250 pounds down to 235 pounds.

## 2011-07-08 NOTE — Assessment & Plan Note (Addendum)
The patient did become worse several days after being seen  By the pulmonary team. He was hospitalized. He is discouraged because he became worse despite added outpatient medications. I tried to explain to him that we are certainly disappointed that this occurred, but careful ongoing outpatient pulmonary followup is extremely important for his care. He informs me today that at this time he wants to see Dr. Orson Aloe in [place]. We will be sure that all records go to Dr. Orson Aloe.

## 2011-07-08 NOTE — Assessment & Plan Note (Signed)
The patient's left ventricular systolic function remains good. No change in therapy.

## 2011-07-08 NOTE — Progress Notes (Signed)
HPI Patient returns today in followup  His cardiac status. I saw him last in April, 2013. He has coronary disease. This has been stable. 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 He has been followed by Dr. Delford Field. When he was seen last it appeared he had an exacerbation of his COPD. He was given a pulsing dose of steroids and some erythromycin. He was scheduled to be seen again in 3 months. Unfortunately he deteriorated with increased shortness of breath and was admitted to [facility] on June 16, 2011. It was felt that he had an exacerbation of his COPD. I have reviewed the records from [facility] hospital concerning his admission including his discharge summary and labs and information from the cardiology consult. While he was sick he did have some increase in his atrial fibrillation rate but then improved. 2-D echo revealed an ejection fraction of 60%. There was some diastolic dysfunction. He was treated with higher dose steroids and antibiotics. Cardizem dose was increased with his rapid rate. It was felt by cardiology that his symptoms were related primarily to his COPD and not his atrial fib. He did have a PA pressure of 45-50 mm of mercury while hospitalized with his COPD exacerbation.  Currently the patient is stable. He feels fatigued when he is walking. He does have some exertional shortness of breath. He does have home O2. He needs to use it more of the time.    Allergies  Allergen Reactions  . Clopidogrel Bisulfate     REACTION: rash    Current Outpatient Prescriptions  Medication Sig Dispense Refill  . albuterol (VENTOLIN HFA) 108 (90 BASE) MCG/ACT inhaler Inhale 2 puffs into the lungs every 6 (six) hours as needed for wheezing.  1 Inhaler  6  . Ascorbic Acid (VITAMIN C) 500 MG tablet Take 500 mg by mouth daily.        Marland Kitchen aspirin 81 MG tablet Take 81 mg by mouth daily.        Marland Kitchen diltiazem (CARDIZEM CD) 240 MG 24 hr capsule Take 1 capsule by mouth  Daily.      . furosemide (LASIX) 80 MG tablet TAKE ONE TABLET BY MOUTH TWICE DAILY  60 tablet  4  . gabapentin (NEURONTIN) 300 MG capsule Take 4 tablets by mouth two times a day.       . levalbuterol (XOPENEX) 1.25 MG/3ML nebulizer solution Take 1.25 mg by nebulization every 6 (six) hours as needed.      . lovastatin (MEVACOR) 20 MG tablet Take 20 mg by mouth 2 (two) times daily.      . metFORMIN (GLUCOPHAGE) 500 MG tablet Take 500 mg by mouth 2 (two) times daily.       . metoprolol (LOPRESSOR) 50 MG tablet Take 25 mg by mouth 2 (two) times daily.      . Multiple Vitamins-Minerals (CENTRUM PO) Take 1 tablet by mouth daily.        Marland Kitchen NITROSTAT 0.4 MG SL tablet Place 0.4 mg under the tongue every 5 (five) minutes as needed.       . Omega-3 Fatty Acids (FISH OIL) 1000 MG CAPS Take 1 capsule by mouth daily.        . potassium chloride SA (K-DUR,KLOR-CON) 20 MEQ tablet TAKE 1 TABLET ONCE DAILY  30 tablet  6  . predniSONE (DELTASONE) 10 MG tablet Take 10 mg by mouth daily.      Marland Kitchen Respiratory Therapy Supplies (FLUTTER) DEVI Use 4 times  daily  1 each  0  . SPIRIVA HANDIHALER 18 MCG inhalation capsule PLACE ONE CAPSULE IN MOUTHPIECE (HANDIHALER) AND INHALE IN MOUTH ONCE A DAY.  30 each  6  . SYMBICORT 160-4.5 MCG/ACT inhaler INHALE 2 PUFFS INTO MOUTH TWICE DAILY  1 Inhaler  6  . warfarin (COUMADIN) 5 MG tablet TAKE AS DIRECTED  45 tablet  3    History   Social History  . Marital Status: Married    Spouse Name: N/A    Number of Children: N/A  . Years of Education: N/A   Occupational History  . Dance movement psychotherapist    Social History Main Topics  . Smoking status: Former Smoker -- 2.0 packs/day for 40 years    Types: Cigarettes    Quit date: 01/06/1998  . Smokeless tobacco: Never Used  . Alcohol Use: Not on file  . Drug Use: Not on file  . Sexually Active: Not on file   Other Topics Concern  . Not on file   Social History Narrative  . No narrative on file    No family history on  file.  Past Medical History  Diagnosis Date  . COPD with emphysema     chronic Dyspnea, followed by Dr. Dennie Bible. Wright, oxygen at night started 11/2008  . Atrial fibrillation     Permanent  . Warfarin anticoagulation     Coumadin  . Right ventricular dysfunction     Right Ventricle, mild moderately dilated by Echo 06/2008  . CAD (coronary artery disease)     .MI with a Taxus stent in 2004..  /   nuclear... June, 2010.. small scar or slight ischemia inferior wall  . Diabetes mellitus   . Hyperlipemia   . Obesity   . Peripheral edema     Mild  . Hypertension     relative hypotension... August, 2011... Cozaar stopped  . Foot pain     Significant neuropathy has been evaluated at [facility].    Past Surgical History  Procedure Date  . Rotator cuff repair     x 3  . Knee surgery     ROS    Patient denies fever, chills, headache, sweats, rash, change in vision, change in hearing, chest pain, nausea vomiting, urinary symptoms. All other systems are reviewed and are negative.  PHYSICAL EXAM  Filed Vitals:   07/08/11 0821  BP: 135/74  Pulse: 82  Height: 5\' 9"  (1.753 m)  Weight: 235 lb (106.595 kg)  SpO2: 95%    ASSESSMENT & PLAN

## 2011-07-08 NOTE — Assessment & Plan Note (Signed)
Coronary disease is stable. No change in therapy. I feel that his current symptoms are not related to coronary ischemia.

## 2011-07-08 NOTE — Assessment & Plan Note (Signed)
Resting atrial fibrillation is controlled today. No change in therapy.

## 2011-07-21 ENCOUNTER — Other Ambulatory Visit: Payer: Self-pay | Admitting: Cardiology

## 2011-07-21 MED ORDER — DILTIAZEM HCL ER COATED BEADS 240 MG PO CP24: 240.0000 mg | ORAL_CAPSULE | Freq: Every day | ORAL | Status: DC

## 2011-07-29 ENCOUNTER — Ambulatory Visit (INDEPENDENT_AMBULATORY_CARE_PROVIDER_SITE_OTHER): Payer: Medicare Other | Admitting: *Deleted

## 2011-07-29 DIAGNOSIS — I4891 Unspecified atrial fibrillation: Secondary | ICD-10-CM

## 2011-07-29 DIAGNOSIS — Z7901 Long term (current) use of anticoagulants: Secondary | ICD-10-CM

## 2011-08-15 ENCOUNTER — Encounter: Payer: Self-pay | Admitting: Critical Care Medicine

## 2011-08-15 ENCOUNTER — Ambulatory Visit (INDEPENDENT_AMBULATORY_CARE_PROVIDER_SITE_OTHER): Payer: Medicare Other | Admitting: Critical Care Medicine

## 2011-08-15 VITALS — BP 104/70 | HR 78 | Temp 97.6°F | Ht 69.0 in | Wt 244.4 lb

## 2011-08-15 DIAGNOSIS — J438 Other emphysema: Secondary | ICD-10-CM

## 2011-08-15 DIAGNOSIS — J439 Emphysema, unspecified: Secondary | ICD-10-CM

## 2011-08-15 MED ORDER — ALBUTEROL SULFATE (2.5 MG/3ML) 0.083% IN NEBU: 2.5000 mg | INHALATION_SOLUTION | Freq: Four times a day (QID) | RESPIRATORY_TRACT | Status: DC | PRN

## 2011-08-15 NOTE — Assessment & Plan Note (Addendum)
Gold Stage D Copd. Recent admit with acute on chronic resp failure Plan No change in inhaled or maintenance medications except change xopenex to albuterol prn in neb med. Pt is back to baseline Return in 2 months

## 2011-08-15 NOTE — Progress Notes (Signed)
Subjective:    Patient ID: [name], male    DOB: [birth year], 71 y.o.   MRN: [id number]  HPI  [name] is a 71 y.o.  white male, history of chronic obstructive  lung disease, primary emphysematous component  06/11/2011 Pt here for recurrent exacerbation of Copd.  Pt gradually worse 3-4 days.  Not as bad as 04/2011   Mucus is copious white not hard to raise.  No chest pain.  No GERD symptoms.  Not sore in throat. More dyspneic with min exertion, even at rest. Pt did not tolerate Daliresp d/t nausea.  08/15/2011 Pt Rx last ov with pred/azithro but worsened and admitted in [facility] 6/13 Pt much worse and acutely ill.  Pt in hosp x 5 days.  Since then doing better if around A/C. Mucus continues white.  No real chest pain.   Pt notes edema in feet that is worse. No sinus issues.  No heartburn.     Past Medical History  Diagnosis Date  . COPD with emphysema     chronic Dyspnea, followed by Dr. Dennie Bible. Wright, oxygen at night started 11/2008  . Atrial fibrillation     Permanent  . Warfarin anticoagulation     Coumadin  . Right ventricular dysfunction     Right Ventricle, mild moderately dilated by Echo 06/2008  . CAD (coronary artery disease)     .MI with a Taxus stent in 2004..  /   nuclear... June, 2010.. small scar or slight ischemia inferior wall  . Diabetes mellitus   . Hyperlipemia   . Obesity   . Peripheral edema     Mild  . Hypertension     relative hypotension... August, 2011... Cozaar stopped  . Foot pain     Significant neuropathy has been evaluated at [facility].  . Ejection fraction     EF 60%, echo, 2010, technically difficult study  . Drug allergy     Plavix causes severe rash  . Pulmonary hypertension     PA pressure 45-50 mmHg, echo, June, 2013, patient hospitalized with a COPD exacerbation at that time.  . Constipation     July, 2013     No family history on file.   History   Social History  . Marital Status: Married   Spouse Name: N/A    Number of Children: N/A  . Years of Education: N/A   Occupational History  . Dance movement psychotherapist    Social History Main Topics  . Smoking status: Former Smoker -- 2.0 packs/day for 40 years    Types: Cigarettes    Quit date: 01/06/1998  . Smokeless tobacco: Never Used  . Alcohol Use: Not on file  . Drug Use: Not on file  . Sexually Active: Not on file   Other Topics Concern  . Not on file   Social History Narrative  . No narrative on file     Allergies  Allergen Reactions  . Clopidogrel Bisulfate     REACTION: rash     Outpatient Prescriptions Prior to Visit  Medication Sig Dispense Refill  . albuterol (VENTOLIN HFA) 108 (90 BASE) MCG/ACT inhaler Inhale 2 puffs into the lungs every 6 (six) hours as needed for wheezing.  1 Inhaler  6  . Ascorbic Acid (VITAMIN C) 500 MG tablet Take 500 mg by mouth daily.        Marland Kitchen aspirin 81 MG tablet Take 81 mg by mouth daily.        Marland Kitchen diltiazem (  CARDIZEM CD) 240 MG 24 hr capsule Take 1 capsule (240 mg total) by mouth daily.  30 capsule  6  . furosemide (LASIX) 80 MG tablet TAKE ONE TABLET BY MOUTH TWICE DAILY  60 tablet  4  . gabapentin (NEURONTIN) 300 MG capsule Take 4 tablets by mouth two times a day.       . lovastatin (MEVACOR) 20 MG tablet Take 20 mg by mouth 2 (two) times daily.      . metFORMIN (GLUCOPHAGE) 500 MG tablet Take 500 mg by mouth 2 (two) times daily.       . metoprolol (LOPRESSOR) 50 MG tablet Take 25 mg by mouth 2 (two) times daily.      . Multiple Vitamins-Minerals (CENTRUM PO) Take 1 tablet by mouth daily.        Marland Kitchen NITROSTAT 0.4 MG SL tablet Place 0.4 mg under the tongue every 5 (five) minutes as needed.       . Omega-3 Fatty Acids (FISH OIL) 1000 MG CAPS Take 1 capsule by mouth daily.        . potassium chloride SA (K-DUR,KLOR-CON) 20 MEQ tablet TAKE 1 TABLET ONCE DAILY  30 tablet  6  . predniSONE (DELTASONE) 10 MG tablet Take 10 mg by mouth daily.      Marland Kitchen Respiratory Therapy Supplies (FLUTTER) DEVI Use  4 times daily  1 each  0  . SPIRIVA HANDIHALER 18 MCG inhalation capsule PLACE ONE CAPSULE IN MOUTHPIECE (HANDIHALER) AND INHALE IN MOUTH ONCE A DAY.  30 each  6  . SYMBICORT 160-4.5 MCG/ACT inhaler INHALE 2 PUFFS INTO MOUTH TWICE DAILY  1 Inhaler  6  . warfarin (COUMADIN) 5 MG tablet TAKE AS DIRECTED  45 tablet  3  . levalbuterol (XOPENEX) 1.25 MG/3ML nebulizer solution Take 1.25 mg by nebulization every 6 (six) hours as needed.          Review of Systems  Constitutional:   No  weight loss, night sweats,  Fevers, chills, fatigue, lassitude. HEENT:   No headaches,  Difficulty swallowing,  Tooth/dental problems,  Sore throat,                No sneezing, itching, ear ache, nasal congestion, post nasal drip,   CV:  No chest pain,  Orthopnea, PND, swelling in lower extremities, anasarca, dizziness, palpitations  GI  No heartburn, indigestion, abdominal pain, nausea, vomiting, diarrhea, change in bowel habits, loss of appetite  Resp: Notes  shortness of breath with exertion not at rest.  No excess mucus, notes  white mucus,  productive cough,  No non-productive cough,  No coughing up of blood.  No change in color of mucus.  No wheezing.  No chest wall deformity  Skin: no rash or lesions.  GU: no dysuria, change in color of urine, no urgency or frequency.  No flank pain.  MS:  No joint pain or swelling.  No decreased range of motion.  No back pain.  Psych:  No change in mood or affect. No depression or anxiety.  No memory loss.     Objective:   Physical Exam  Filed Vitals:   08/15/11 0944  BP: 104/70  Pulse: 78  Temp: 97.6 F (36.4 C)  TempSrc: Oral  Height: 5\' 9"  (1.753 m)  Weight: 110.859 kg (244 lb 6.4 oz)  SpO2: 95%    Gen: Pleasant, well-nourished, in no distress,  normal affect  ENT: No lesions,  mouth clear,  oropharynx clear, no postnasal drip  Neck: No  JVD, no TMG, no carotid bruits  Lungs: No use of accessory muscles, no dullness to percussion, distant BS,    Cardiovascular: RRR, heart sounds normal, no murmur or gallops, no peripheral edema  Abdomen: soft and NT, no HSM,  BS normal  Musculoskeletal: No deformities, no cyanosis or clubbing  Neuro: alert, non focal  Skin: Warm, no lesions or rashes        Assessment & Plan:   COPD with emphysema Gold Stage D Copd. Recent admit with acute on chronic resp failure Plan No change in inhaled or maintenance medications except change xopenex to albuterol prn in neb med. Pt is back to baseline Return in 2 months    Updated Medication List Outpatient Encounter Prescriptions as of 08/15/2011  Medication Sig Dispense Refill  . albuterol (VENTOLIN HFA) 108 (90 BASE) MCG/ACT inhaler Inhale 2 puffs into the lungs every 6 (six) hours as needed for wheezing.  1 Inhaler  6  . Ascorbic Acid (VITAMIN C) 500 MG tablet Take 500 mg by mouth daily.        Marland Kitchen aspirin 81 MG tablet Take 81 mg by mouth daily.        Marland Kitchen diltiazem (CARDIZEM CD) 240 MG 24 hr capsule Take 1 capsule (240 mg total) by mouth daily.  30 capsule  6  . furosemide (LASIX) 80 MG tablet TAKE ONE TABLET BY MOUTH TWICE DAILY  60 tablet  4  . gabapentin (NEURONTIN) 300 MG capsule Take 4 tablets by mouth two times a day.       . lovastatin (MEVACOR) 20 MG tablet Take 20 mg by mouth 2 (two) times daily.      . metFORMIN (GLUCOPHAGE) 500 MG tablet Take 500 mg by mouth 2 (two) times daily.       . metoprolol (LOPRESSOR) 50 MG tablet Take 25 mg by mouth 2 (two) times daily.      . Multiple Vitamins-Minerals (CENTRUM PO) Take 1 tablet by mouth daily.        Marland Kitchen NITROSTAT 0.4 MG SL tablet Place 0.4 mg under the tongue every 5 (five) minutes as needed.       . Omega-3 Fatty Acids (FISH OIL) 1000 MG CAPS Take 1 capsule by mouth daily.        . potassium chloride SA (K-DUR,KLOR-CON) 20 MEQ tablet TAKE 1 TABLET ONCE DAILY  30 tablet  6  . predniSONE (DELTASONE) 10 MG tablet Take 10 mg by mouth daily.      Marland Kitchen Respiratory Therapy Supplies (FLUTTER) DEVI  Use 4 times daily  1 each  0  . SPIRIVA HANDIHALER 18 MCG inhalation capsule PLACE ONE CAPSULE IN MOUTHPIECE (HANDIHALER) AND INHALE IN MOUTH ONCE A DAY.  30 each  6  . SYMBICORT 160-4.5 MCG/ACT inhaler INHALE 2 PUFFS INTO MOUTH TWICE DAILY  1 Inhaler  6  . warfarin (COUMADIN) 5 MG tablet TAKE AS DIRECTED  45 tablet  3  . DISCONTD: levalbuterol (XOPENEX) 1.25 MG/3ML nebulizer solution Take 1.25 mg by nebulization every 6 (six) hours as needed.      Marland Kitchen albuterol (PROVENTIL) (2.5 MG/3ML) 0.083% nebulizer solution Take 3 mLs (2.5 mg total) by nebulization every 6 (six) hours as needed for wheezing.  75 mL  12

## 2011-08-15 NOTE — Patient Instructions (Addendum)
No change in medications. You may use albuterol in nebulizer as needed Return in       2 months

## 2011-08-21 ENCOUNTER — Other Ambulatory Visit: Payer: Self-pay | Admitting: Cardiology

## 2011-08-25 ENCOUNTER — Other Ambulatory Visit: Payer: Self-pay | Admitting: Cardiology

## 2011-08-29 ENCOUNTER — Ambulatory Visit (INDEPENDENT_AMBULATORY_CARE_PROVIDER_SITE_OTHER): Payer: Medicare Other | Admitting: *Deleted

## 2011-08-29 DIAGNOSIS — Z7901 Long term (current) use of anticoagulants: Secondary | ICD-10-CM

## 2011-08-29 DIAGNOSIS — I4891 Unspecified atrial fibrillation: Secondary | ICD-10-CM

## 2011-09-03 ENCOUNTER — Ambulatory Visit: Payer: Medicare Other | Admitting: Cardiology

## 2011-09-22 ENCOUNTER — Encounter: Payer: Self-pay | Admitting: Cardiology

## 2011-09-22 ENCOUNTER — Ambulatory Visit (INDEPENDENT_AMBULATORY_CARE_PROVIDER_SITE_OTHER): Payer: Medicare Other | Admitting: Cardiology

## 2011-09-22 VITALS — BP 119/72 | HR 92 | Ht 69.0 in | Wt 244.0 lb

## 2011-09-22 DIAGNOSIS — R238 Other skin changes: Secondary | ICD-10-CM

## 2011-09-22 DIAGNOSIS — J439 Emphysema, unspecified: Secondary | ICD-10-CM

## 2011-09-22 DIAGNOSIS — Z7901 Long term (current) use of anticoagulants: Secondary | ICD-10-CM

## 2011-09-22 DIAGNOSIS — I2510 Atherosclerotic heart disease of native coronary artery without angina pectoris: Secondary | ICD-10-CM

## 2011-09-22 DIAGNOSIS — J438 Other emphysema: Secondary | ICD-10-CM

## 2011-09-22 DIAGNOSIS — R609 Edema, unspecified: Secondary | ICD-10-CM

## 2011-09-22 DIAGNOSIS — I4891 Unspecified atrial fibrillation: Secondary | ICD-10-CM

## 2011-09-22 NOTE — Assessment & Plan Note (Signed)
I am concerned about the fluid-containing blisters that he has on his right foot with one on the left foot. Etiology is not clear to me. It seems unlikely that this could be shingles in this area. This would be especially true since he has it on both feet. The patient is on Coumadin. I have never seen any type of lesion of this sort related to Coumadin. People can have atheroembolic disease related to Coumadin, but it does not look like this with blisters. I've asked him to call and plan to see his primary physician over the next several days. I will be sure that a copy of this note gets to his primary doctor.

## 2011-09-22 NOTE — Progress Notes (Signed)
HPI  Patient is seen today to followup coronary disease and atrial fibrillation. He has significant pulmonary disease. I saw him last July 08, 2011. Since that time he has seen Dr.Wright in followup. The patient is very pleased with the care.  He's not having any significant chest pain or shortness of breath. His atrial fib rate is under reasonable control. His coronary disease appears to be stable.  He has a new problem that he mentions today. He's had the appearance of blisters with clear fluid on the large toe and second toe of his right foot. He also has one of these on the great toe on his left foot. He has significant peripheral neuropathy. He does not have any pain from these skin lesions but he probably would not feel that anyway. I think he has physically tried to discharge the clear liquid from the blisters. They appear to be scabbing over. There is mild redness. There is no evidence of cellulitis.  Allergies  Allergen Reactions  . Clopidogrel Bisulfate     REACTION: rash    Current Outpatient Prescriptions  Medication Sig Dispense Refill  . albuterol (PROVENTIL) (2.5 MG/3ML) 0.083% nebulizer solution Take 3 mLs (2.5 mg total) by nebulization every 6 (six) hours as needed for wheezing.  75 mL  12  . albuterol (VENTOLIN HFA) 108 (90 BASE) MCG/ACT inhaler Inhale 2 puffs into the lungs every 6 (six) hours as needed for wheezing.  1 Inhaler  6  . aspirin 81 MG tablet Take 81 mg by mouth daily.        . furosemide (LASIX) 80 MG tablet TAKE ONE TABLET BY MOUTH TWICE DAILY  60 tablet  6  . gabapentin (NEURONTIN) 300 MG capsule Take 4 tablets by mouth two times a day.       . lovastatin (MEVACOR) 20 MG tablet Take 20 mg by mouth 2 (two) times daily.      . metFORMIN (GLUCOPHAGE) 500 MG tablet Take 500 mg by mouth 2 (two) times daily.       . metoprolol (LOPRESSOR) 50 MG tablet Take 25 mg by mouth 2 (two) times daily.      . Multiple Vitamins-Minerals (CENTRUM PO) Take 1 tablet by mouth  daily.        Marland Kitchen NITROSTAT 0.4 MG SL tablet Place 0.4 mg under the tongue every 5 (five) minutes as needed.       . Omega-3 Fatty Acids (FISH OIL) 1000 MG CAPS Take 1 capsule by mouth daily.        . potassium chloride SA (K-DUR,KLOR-CON) 20 MEQ tablet TAKE 1 TABLET ONCE DAILY  30 tablet  6  . predniSONE (DELTASONE) 10 MG tablet Take 10 mg by mouth daily.      Marland Kitchen Respiratory Therapy Supplies (FLUTTER) DEVI Use 4 times daily  1 each  0  . SPIRIVA HANDIHALER 18 MCG inhalation capsule PLACE ONE CAPSULE IN MOUTHPIECE (HANDIHALER) AND INHALE IN MOUTH ONCE A DAY.  30 each  6  . SYMBICORT 160-4.5 MCG/ACT inhaler INHALE 2 PUFFS INTO MOUTH TWICE DAILY  1 Inhaler  6  . warfarin (COUMADIN) 5 MG tablet TAKE AS DIRECTED  45 tablet  6    History   Social History  . Marital Status: Married    Spouse Name: N/A    Number of Children: N/A  . Years of Education: N/A   Occupational History  . Dance movement psychotherapist    Social History Main Topics  . Smoking status: Former Smoker --  2.0 packs/day for 40 years    Types: Cigarettes    Quit date: 01/06/1998  . Smokeless tobacco: Never Used  . Alcohol Use: Not on file  . Drug Use: Not on file  . Sexually Active: Not on file   Other Topics Concern  . Not on file   Social History Narrative  . No narrative on file    No family history on file.  Past Medical History  Diagnosis Date  . COPD with emphysema     chronic Dyspnea, followed by Dr. Dennie Bible. Wright, oxygen at night started 11/2008  . Atrial fibrillation     Permanent  . Warfarin anticoagulation     Coumadin  . Right ventricular dysfunction     Right Ventricle, mild moderately dilated by Echo 06/2008  . CAD (coronary artery disease)     .MI with a Taxus stent in 2004..  /   nuclear... June, 2010.. small scar or slight ischemia inferior wall  . Diabetes mellitus   . Hyperlipemia   . Obesity   . Peripheral edema     Mild  . Hypertension     relative hypotension... August, 2011... Cozaar stopped  .  Foot pain     Significant neuropathy has been evaluated at [facility].  . Ejection fraction     EF 60%, echo, 2010, technically difficult study  . Drug allergy     Plavix causes severe rash  . Pulmonary hypertension     PA pressure 45-50 mmHg, echo, June, 2013, patient hospitalized with a COPD exacerbation at that time.  . Constipation     July, 2013    Past Surgical History  Procedure Date  . Rotator cuff repair     x 3  . Knee surgery     ROS   Patient denies fever, chills, headache, sweats, rash, change in vision, change in hearing, chest pain, nausea vomiting, urinary symptoms. All other systems are reviewed and are negative other than the history of present illness.  PHYSICAL EXAM   Patient is stable. He is oriented to person time and place. Affect is normal. He is overweight. There is no jugular venous distention. Lungs reveal diffuse scattered rhonchi that are chronic. There is no respiratory distress. Cardiac exam reveals distant heart sounds. Abdomen is soft. There is trace peripheral edema that is chronic. He has skin lesions on his feet. There are approximately 5 areas on the great toe and second toe of his right foot. These had been blisters with clear liquid that he has expelled liquid from. They appear to be healing. He has one of these lesions on the great toe of his left foot.  Filed Vitals:   09/22/11 0946  BP: 119/72  Pulse: 92  Height: 5\' 9"  (1.753 m)  Weight: 244 lb (110.678 kg)   EKG is done today and reviewed by me. He has atrial fibrillation with a controlled rate. There is no significant change.  ASSESSMENT & PLAN

## 2011-09-22 NOTE — Assessment & Plan Note (Signed)
The patient is stable at this time and receiving excellent care. He has significant disease.

## 2011-09-22 NOTE — Patient Instructions (Addendum)
Increase Lasix to 120mg  (1 1/2 tabs) twice a day x 3 days only, then return to previous dose Lab:  Today - [facility]  Office will contact with results Continue all other current medications. Call primary MD today for appointment in reference to lesions on feet Follow up in  6 weeks

## 2011-09-22 NOTE — Assessment & Plan Note (Signed)
He continues on Coumadin.  No change in therapy. 

## 2011-09-22 NOTE — Assessment & Plan Note (Signed)
Coronary disease is stable. No change in therapy. 

## 2011-09-22 NOTE — Assessment & Plan Note (Addendum)
The patient has chronic edema. It is slightly worse than usual. I do not think that this is enough edema to cause these lesions in his toes. I have suggested that he increase his Lasix from 80 twice a day to 120 twice a day for 3 days. He will call us to let us know how he is doing. I will see him back in 6 weeks unless he has other problems.

## 2011-09-22 NOTE — Assessment & Plan Note (Signed)
Atrial fib rate is controlled. The patient is on Coumadin. No change

## 2011-09-26 ENCOUNTER — Ambulatory Visit (INDEPENDENT_AMBULATORY_CARE_PROVIDER_SITE_OTHER): Payer: Medicare Other | Admitting: *Deleted

## 2011-09-26 DIAGNOSIS — I4891 Unspecified atrial fibrillation: Secondary | ICD-10-CM

## 2011-09-26 DIAGNOSIS — Z7901 Long term (current) use of anticoagulants: Secondary | ICD-10-CM

## 2011-09-29 ENCOUNTER — Other Ambulatory Visit: Payer: Self-pay

## 2011-10-03 ENCOUNTER — Telehealth: Payer: Self-pay | Admitting: *Deleted

## 2011-10-03 ENCOUNTER — Ambulatory Visit (INDEPENDENT_AMBULATORY_CARE_PROVIDER_SITE_OTHER): Payer: Medicare Other | Admitting: *Deleted

## 2011-10-03 DIAGNOSIS — I4891 Unspecified atrial fibrillation: Secondary | ICD-10-CM

## 2011-10-03 DIAGNOSIS — Z7901 Long term (current) use of anticoagulants: Secondary | ICD-10-CM

## 2011-10-03 LAB — POCT INR: INR: 2

## 2011-10-03 MED ORDER — POTASSIUM CHLORIDE CRYS ER 20 MEQ PO TBCR: 40.0000 meq | EXTENDED_RELEASE_TABLET | Freq: Every day | ORAL | Status: DC

## 2011-10-03 NOTE — Telephone Encounter (Signed)
Notes Recorded by Lesle Chris, LPN on 1/61/0960 at 8:43 AM Patient in for coumadin check this morning. Vashti Hey, RN notified of below. Patient stated that he will contact us when he needs refill.

## 2011-10-03 NOTE — Telephone Encounter (Signed)
Message copied by Lesle Chris on Fri Oct 03, 2011  8:43 AM ------      Message from: [place], [place] D      Created: Fri Oct 03, 2011  8:12 AM       Please let him know that his recent lab showed that his kidney function was stable. His potassium is a little lower than I would like. Please redocument what dose of potassium he is taking. If it is 20 mEq daily, increase it to 40 mEq daily. It is something other than 20 mEq daily, let me know we will make further decisions

## 2011-10-06 ENCOUNTER — Other Ambulatory Visit: Payer: Self-pay | Admitting: Cardiology

## 2011-10-06 MED ORDER — POTASSIUM CHLORIDE CRYS ER 20 MEQ PO TBCR: 40.0000 meq | EXTENDED_RELEASE_TABLET | Freq: Every day | ORAL | Status: DC

## 2011-10-07 ENCOUNTER — Other Ambulatory Visit: Payer: Self-pay | Admitting: Cardiology

## 2011-10-07 NOTE — Telephone Encounter (Signed)
Called pharmacy and gave verbal orders to refill potassium 20 MEQ two tablets once daily. Qty of 60 refill of 3

## 2011-10-09 ENCOUNTER — Encounter: Payer: Self-pay | Admitting: Vascular Surgery

## 2011-10-10 ENCOUNTER — Encounter: Payer: Self-pay | Admitting: Vascular Surgery

## 2011-10-10 ENCOUNTER — Encounter (INDEPENDENT_AMBULATORY_CARE_PROVIDER_SITE_OTHER): Payer: Medicare Other | Admitting: *Deleted

## 2011-10-10 ENCOUNTER — Ambulatory Visit (INDEPENDENT_AMBULATORY_CARE_PROVIDER_SITE_OTHER): Payer: Medicare Other | Admitting: Vascular Surgery

## 2011-10-10 VITALS — BP 147/100 | HR 100 | Resp 22 | Ht 69.0 in | Wt 251.0 lb

## 2011-10-10 DIAGNOSIS — I872 Venous insufficiency (chronic) (peripheral): Secondary | ICD-10-CM | POA: Insufficient documentation

## 2011-10-10 DIAGNOSIS — R609 Edema, unspecified: Secondary | ICD-10-CM

## 2011-10-10 DIAGNOSIS — R600 Localized edema: Secondary | ICD-10-CM

## 2011-10-10 DIAGNOSIS — I779 Disorder of arteries and arterioles, unspecified: Secondary | ICD-10-CM

## 2011-10-10 DIAGNOSIS — I743 Embolism and thrombosis of arteries of the lower extremities: Secondary | ICD-10-CM

## 2011-10-10 NOTE — Progress Notes (Signed)
[facility] [place]  Referred by:  Juliette Alcide, MD [address] 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 (1940-01-23) male who presents with chief complaint: bilateral foot burning.  Patient not certain of chronology of sx. Pain is described as burning in both feet, severity 3-6/10, and associated with light touch to feet.   The patient is not aware of how long his feet have been cyanotic.  He denies any sx c/w Raynaud's syndrome.   Patient also has swelling in both feet that wax and wanes.  He recently has developed blistering which per the family has been healing up.  Patient has attempted to treat this pain with Neurotin.  The patient has no claudication or rest pain symptoms.  The patient denies any ulcers or gangrene.   The patient's ability to walk is severely limited by his severe COPD.  Atherosclerotic risk factors include: DM, hyperlipidemia, HTN.  Past Medical History  Diagnosis Date  . COPD with emphysema     chronic Dyspnea, followed by Dr. Dennie Bible. [place], oxygen at night started 11/2008  . Atrial fibrillation     Permanent  . Warfarin anticoagulation     Coumadin  . Right ventricular dysfunction     Right Ventricle, mild moderately dilated by Echo 06/2008  . CAD (coronary artery disease)     .MI with a Taxus stent in 2004..  /   nuclear... June, 2010.. small scar or slight ischemia inferior wall  . Diabetes mellitus   . Hyperlipemia   . Obesity   . Peripheral edema     Mild  . Hypertension     relative hypotension... August, 2011... Cozaar stopped  . Foot pain     Significant neuropathy has been evaluated at [facility]  . Ejection fraction     EF 60%, echo, 2010, technically difficult study  . Drug allergy     Plavix causes severe rash  . Pulmonary hypertension     PA pressure 45-50 mmHg, echo, June, 2013, patient hospitalized with a COPD  exacerbation at that time.  . Constipation     July, 2013  . Blisters of multiple sites     Patient has blisters with clear liquid. There are 5 or more on the great toe and second toe of the right foot. There is one on the left great toe.    Past Surgical History  Procedure Date  . Rotator cuff repair     x 3  . Knee surgery     History   Social History  . Marital Status: Married    Spouse Name: N/A    Number of Children: N/A  . Years of Education: N/A   Occupational History  . Dance movement psychotherapist    Social History Main Topics  . Smoking status: Former Smoker -- 2.0 packs/day for 40 years    Types: Cigarettes    Quit date: 01/06/1998  . Smokeless tobacco: Never Used  . Alcohol Use: No  . Drug Use: No  . Sexually Active: Not on file   Other Topics Concern  . Not on file   Social History Narrative  . No narrative on file    Family History  Problem Relation Age of Onset  . Heart disease Mother   . Cancer Father     throat    Current Outpatient Prescriptions on File Prior to Visit  Medication Sig Dispense Refill  . albuterol (PROVENTIL) (2.5 MG/3ML) 0.083% nebulizer solution Take 3 mLs (2.5 mg total) by nebulization every 6 (six) hours as needed for wheezing.  75 mL  12  . albuterol (VENTOLIN HFA) 108 (90 BASE) MCG/ACT inhaler Inhale 2 puffs into the lungs every 6 (six) hours as needed for wheezing.  1 Inhaler  6  . aspirin 81 MG tablet Take 81 mg by mouth daily.        . furosemide (LASIX) 80 MG tablet TAKE ONE TABLET BY MOUTH TWICE DAILY  60 tablet  6  . gabapentin (NEURONTIN) 300 MG capsule Take 4 tablets by mouth two times a day.       . lovastatin (MEVACOR) 20 MG tablet Take 20 mg by mouth 2 (two) times daily.      . metFORMIN (GLUCOPHAGE) 500 MG tablet Take 500 mg by mouth 2 (two) times daily.       . metoprolol (LOPRESSOR) 50 MG tablet Take 25 mg by mouth 2 (two) times daily.      . Multiple Vitamins-Minerals (CENTRUM PO) Take 1 tablet by mouth daily.        Marland Kitchen  NITROSTAT 0.4 MG SL tablet Place 0.4 mg under the tongue every 5 (five) minutes as needed.       . Omega-3 Fatty Acids (FISH OIL) 1000 MG CAPS Take 1 capsule by mouth daily.        . potassium chloride SA (K-DUR,KLOR-CON) 20 MEQ tablet Take 2 tablets (40 mEq total) by mouth daily.  60 tablet  3  . predniSONE (DELTASONE) 10 MG tablet Take 10 mg by mouth daily.      Marland Kitchen Respiratory Therapy Supplies (FLUTTER) DEVI Use 4 times daily  1 each  0  . SPIRIVA HANDIHALER 18 MCG inhalation capsule PLACE ONE CAPSULE IN MOUTHPIECE (HANDIHALER) AND INHALE IN MOUTH ONCE A DAY.  30 each  6  . SYMBICORT 160-4.5 MCG/ACT inhaler INHALE 2 PUFFS INTO MOUTH TWICE DAILY  1 Inhaler  6  . warfarin (COUMADIN) 5 MG tablet TAKE AS DIRECTED  45 tablet  6    Allergies  Allergen Reactions  . Clopidogrel Bisulfate     REACTION: rash   REVIEW OF SYSTEMS:  (Positives checked otherwise negative)  CARDIOVASCULAR:  [ ]  chest pain, [ ]  chest pressure, [ ]  palpitations, [x]  shortness of breath when laying flat, [x]  shortness of breath with exertion,   [x]  pain in feet when walking, [x]  pain in feet when laying flat, [ ]  history of blood clot in veins (DVT), [ ]  history of phlebitis, [x]  swelling in legs, [ ]  varicose veins  PULMONARY:  [x]  chronic cough, [ ]  asthma, [ ]  wheezing  NEUROLOGIC:  [ ]  weakness in arms or legs, [ ]  numbness in arms or legs, [ ]  difficulty speaking or slurred speech, [ ]  temporary loss of vision in one eye, [ ]  dizziness  HEMATOLOGIC:  [ ]  bleeding problems, [ ]  problems with blood clotting too easily  MUSCULOSKEL:  [ ]  joint pain, [ ]  joint swelling  GASTROINTEST:  [ ]   Vomiting blood, [ ]   Blood in stool     GENITOURINARY:  [ ]   Burning with urination, [ ]   Blood in urine  PSYCHIATRIC:  [ ]  history of major depression  INTEGUMENTARY:  [ ]  rashes, [ ]  ulcers  CONSTITUTIONAL:  [ ]  fever, [ ]  chills  Physical Examination  Filed Vitals:   10/10/11 0836  BP: 147/100  Pulse: 100  Resp: 22    Height: 5\' 9"  (1.753 m)  Weight: 251 lb (113.853 kg)   Body mass index is 37.07 kg/(m^2).  General: A&O x 3, WD, obese  Head: [place]/AT  Ear/Nose/Throat: Hearing grossly intact, nares w/o erythema or drainage, oropharynx w/o Erythema/Exudate  Eyes: PERRLA, EOMI  Neck: Supple, no nuchal rigidity, no palpable LAD  Pulmonary: Sym exp, good air movt, CTAB, no rales, rhonchi, & wheezing  Cardiac: RRR, Nl S1, S2, no Murmurs, rubs or gallops  Vascular: Vessel Right Left  Radial Palpable Palpable  Ulnar Palpable Palpable  Brachial Palpable Palpable  Carotid Palpable, without bruit Palpable, without bruit  Aorta Not palpable due to obesity N/A  Femoral Palpable Palpable  Popliteal Not palpable Not palpable  PT Not Palpable Not Palpable  DP Faintly Palpable Faintly Palpable   Gastrointestinal: soft, NTND, -G/R, - HSM, - masses, - CVAT B, large pannus  Musculoskeletal: M/S 5/5 throughout , Extremities without ischemic changes except  Healed blisters on both feet, small healed scabs on both legs, cyanotic feet, cool feet  Neurologic: CN 2-12 intact , Pain and light touch intact in extremities , Motor exam as listed above  Psychiatric: Judgment intact, Mood & affect appropriatefor pt's clinical situation  Dermatologic: See M/S exam for extremity exam, no rashes otherwise noted  Lymph : No Cervical, Axillary, or Inguinal lymphadenopathy   Non-Invasive Vascular Imaging  OSH ABI (Date: 10/10/11)  RLE: 0.77, TBI 0.77  LLE: 0.88, TBI 0.69  BLE Venous insufficiency duplex (Date: 10/10/11)  RLE: deep system reflux  LLE: deep system reflux  Outside Studies/Documentation 5 pages of outside documents were reviewed including: outside ABI and clinical chart.  Medical Decision Making  EMILE MENTION is a 71 y.o. 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    His ABI may be falsely elevated as he is a diabetic but his TBI are bilaterally normal, suggesting adequate perfusion down to the level of the toes.  If he wasn't already healing his wound, I would recommend proceeding with an Aortogram, bilateral runoff, and possible intervention as non-invasive studies in diabetic patients can be confusing.    I discussed with the patient the natural history of his degree of PAD: 75% of patients have stable or improved symptoms in a year an only 2% require amputation. Eventually 20% may require intervention in a year.  I discussed in depth with the patient the nature of atherosclerosis, and emphasized the importance of maximal medical management including strict control of blood pressure, blood glucose, and lipid levels, antiplatelet agent, obtaining regular exercise, and cessation of smoking.    The patient is aware that without maximal medical management the underlying atherosclerotic disease process will progress, limiting the benefit of any interventions.  I discussed in depth with the patient a walking plan and how to execute such.  I doubt he can execute such.  I recommended he try BLE compression stockings at 15-20 mm Hg (OTC).  I doubt he can tolerate the 20-30 mm Hg medical grade stockings as he cannot tolerate socks due to his peripheral neuropathy.  I will have him come back in 4 weeks to check on his wounds.  If they are not resolved by then, I would proceed with the above noted procedure.  Thank you for allowing [place]  to participate in this patient's care.  Leonides Sake, MD [facility] [place] Office: [phone] Pager: [phone]  10/10/2011, 9:12 AM

## 2011-10-13 ENCOUNTER — Ambulatory Visit (INDEPENDENT_AMBULATORY_CARE_PROVIDER_SITE_OTHER): Payer: Medicare Other | Admitting: Critical Care Medicine

## 2011-10-13 ENCOUNTER — Encounter: Payer: Self-pay | Admitting: Critical Care Medicine

## 2011-10-13 VITALS — BP 128/80 | HR 88 | Temp 97.5°F | Ht 69.0 in | Wt 253.0 lb

## 2011-10-13 DIAGNOSIS — J438 Other emphysema: Secondary | ICD-10-CM

## 2011-10-13 DIAGNOSIS — Z23 Encounter for immunization: Secondary | ICD-10-CM

## 2011-10-13 DIAGNOSIS — J439 Emphysema, unspecified: Secondary | ICD-10-CM

## 2011-10-13 NOTE — Assessment & Plan Note (Addendum)
Gold D Copd with emphysema.  Stable at this time CAT 26 10/13/2011 Plan  No change in inhaled or maintenance medications. Flu vaccine  Return in 2 months

## 2011-10-13 NOTE — Patient Instructions (Signed)
No change in medications. Return in        2 months       Flu vaccine today

## 2011-10-13 NOTE — Progress Notes (Signed)
Subjective:    Patient ID: [name], male    DOB: 03-Sep-1940, 71 y.o.   MRN: [id number]  HPI  [name] is a 71 y.o.  white male, history of chronic obstructive  lung disease, primary emphysematous component  06/11/2011 Pt here for recurrent exacerbation of Copd.  Pt gradually worse 3-4 days.  Not as bad as 04/2011   Mucus is copious white not hard to raise.  No chest pain.  No GERD symptoms.  Not sore in throat. More dyspneic with min exertion, even at rest. Pt did not tolerate Daliresp d/t nausea.  08/15/2011 Pt Rx last ov with pred/azithro but worsened and admitted in [facility] 6/13 Pt much worse and acutely ill.  Pt in hosp x 5 days.  Since then doing better if around A/C. Mucus continues white.  No real chest pain.   Pt notes edema in feet that is worse. No sinus issues.  No heartburn.   10/13/2011 Pt has been seen by vascular [facility] specialist.  VVS:  Imogene Burn. Wear TED stockings. This helps the edema.  Dyspnea is better.  Cough is prod white mucus.   Pt denies any significant sore throat, nasal congestion or excess secretions, fever, chills, sweats, unintended weight loss, pleurtic or exertional chest pain, orthopnea PND, or leg swelling Pt denies any increase in rescue therapy over baseline, denies waking up needing it or having any early am or nocturnal exacerbations of coughing/wheezing/or dyspnea. Pt also denies any obvious fluctuation in symptoms with  weather or environmental change or other alleviating or aggravating factors     Past Medical History  Diagnosis Date  . COPD with emphysema     chronic Dyspnea, followed by Dr. Dennie Bible. [place], oxygen at night started 11/2008  . Atrial fibrillation     Permanent  . Warfarin anticoagulation     Coumadin  . Right ventricular dysfunction     Right Ventricle, mild moderately dilated by Echo 06/2008  . CAD (coronary artery disease)     .MI with a Taxus stent in 2004..  /   nuclear... June, 2010.. small scar or slight ischemia  inferior wall  . Diabetes mellitus   . Hyperlipemia   . Obesity   . Peripheral edema     Mild  . Hypertension     relative hypotension... August, 2011... Cozaar stopped  . Foot pain     Significant neuropathy has been evaluated at [facility].  . Ejection fraction     EF 60%, echo, 2010, technically difficult study  . Drug allergy     Plavix causes severe rash  . Pulmonary hypertension     PA pressure 45-50 mmHg, echo, June, 2013, patient hospitalized with a COPD exacerbation at that time.  . Constipation     July, 2013  . Blisters of multiple sites     Patient has blisters with clear liquid. There are 5 or more on the great toe and second toe of the right foot. There is one on the left great toe.     Family History  Problem Relation Age of Onset  . Heart disease Mother   . Cancer Father     throat     History   Social History  . Marital Status: Married    Spouse Name: N/A    Number of Children: N/A  . Years of Education: N/A   Occupational History  . Dance movement psychotherapist    Social History Main Topics  . Smoking status: Former Smoker --  2.0 packs/day for 40 years    Types: Cigarettes    Quit date: 01/06/1998  . Smokeless tobacco: Never Used  . Alcohol Use: No  . Drug Use: No  . Sexually Active: Not on file   Other Topics Concern  . Not on file   Social History Narrative  . No narrative on file     Allergies  Allergen Reactions  . Clopidogrel Bisulfate     REACTION: rash     Outpatient Prescriptions Prior to Visit  Medication Sig Dispense Refill  . albuterol (PROVENTIL) (2.5 MG/3ML) 0.083% nebulizer solution Take 3 mLs (2.5 mg total) by nebulization every 6 (six) hours as needed for wheezing.  75 mL  12  . albuterol (VENTOLIN HFA) 108 (90 BASE) MCG/ACT inhaler Inhale 2 puffs into the lungs every 6 (six) hours as needed for wheezing.  1 Inhaler  6  . aspirin 81 MG tablet Take 81 mg by mouth daily.        . furosemide (LASIX) 80 MG tablet  TAKE ONE TABLET BY MOUTH TWICE DAILY  60 tablet  6  . gabapentin (NEURONTIN) 300 MG capsule Take 4 tablets by mouth two times a day.       . lovastatin (MEVACOR) 20 MG tablet Take 20 mg by mouth 2 (two) times daily.      . metFORMIN (GLUCOPHAGE) 500 MG tablet Take 500 mg by mouth 2 (two) times daily.       . metoprolol (LOPRESSOR) 50 MG tablet Take 25 mg by mouth 2 (two) times daily.      . Multiple Vitamins-Minerals (CENTRUM PO) Take 1 tablet by mouth daily.        Marland Kitchen NITROSTAT 0.4 MG SL tablet Place 0.4 mg under the tongue every 5 (five) minutes as needed.       . Omega-3 Fatty Acids (FISH OIL) 1000 MG CAPS Take 1 capsule by mouth daily.        . potassium chloride SA (K-DUR,KLOR-CON) 20 MEQ tablet Take 2 tablets (40 mEq total) by mouth daily.  60 tablet  3  . predniSONE (DELTASONE) 10 MG tablet Take 10 mg by mouth daily.      Marland Kitchen Respiratory Therapy Supplies (FLUTTER) DEVI Use 4 times daily  1 each  0  . SPIRIVA HANDIHALER 18 MCG inhalation capsule PLACE ONE CAPSULE IN MOUTHPIECE (HANDIHALER) AND INHALE IN MOUTH ONCE A DAY.  30 each  6  . SYMBICORT 160-4.5 MCG/ACT inhaler INHALE 2 PUFFS INTO MOUTH TWICE DAILY  1 Inhaler  6  . warfarin (COUMADIN) 5 MG tablet TAKE AS DIRECTED  45 tablet  6  . diltiazem (CARDIZEM CD) 240 MG 24 hr capsule       . sulfamethoxazole-trimethoprim (BACTRIM DS) 800-160 MG per tablet           Review of Systems  Constitutional:   No  weight loss, night sweats,  Fevers, chills, fatigue, lassitude. HEENT:   No headaches,  Difficulty swallowing,  Tooth/dental problems,  Sore throat,                No sneezing, itching, ear ache, nasal congestion, post nasal drip,   CV:  No chest pain,  Orthopnea, PND, swelling in lower extremities, anasarca, dizziness, palpitations  GI  No heartburn, indigestion, abdominal pain, nausea, vomiting, diarrhea, change in bowel habits, loss of appetite  Resp: Notes  shortness of breath with exertion not at rest.  No excess mucus, notes   white mucus,  productive cough,  No non-productive cough,  No coughing up of blood.  No change in color of mucus.  No wheezing.  No chest wall deformity  Skin: no rash or lesions.  GU: no dysuria, change in color of urine, no urgency or frequency.  No flank pain.  MS:  No joint pain or swelling.  No decreased range of motion.  No back pain.  Psych:  No change in mood or affect. No depression or anxiety.  No memory loss.     Objective:   Physical Exam  Filed Vitals:   10/13/11 0902  BP: 128/80  Pulse: 88  Temp: 97.5 F (36.4 C)  TempSrc: Oral  Height: 5\' 9"  (1.753 m)  Weight: 253 lb (114.76 kg)  SpO2: 93%    Gen: Pleasant, well-nourished, in no distress,  normal affect  ENT: No lesions,  mouth clear,  oropharynx clear, no postnasal drip  Neck: No JVD, no TMG, no carotid bruits  Lungs: No use of accessory muscles, no dullness to percussion, distant BS,   Cardiovascular: RRR, heart sounds normal, no murmur or gallops, no peripheral edema  Abdomen: soft and NT, no HSM,  BS normal  Musculoskeletal: No deformities, no cyanosis or clubbing  Neuro: alert, non focal  Skin: Warm, no lesions or rashes        Assessment & Plan:   COPD with emphysema Gold D Gold D Copd with emphysema.  Stable at this time CAT 26 10/13/2011 Plan  No change in inhaled or maintenance medications. Flu vaccine  Return in 2 months     Updated Medication List Outpatient Encounter Prescriptions as of 10/13/2011  Medication Sig Dispense Refill  . albuterol (PROVENTIL) (2.5 MG/3ML) 0.083% nebulizer solution Take 3 mLs (2.5 mg total) by nebulization every 6 (six) hours as needed for wheezing.  75 mL  12  . albuterol (VENTOLIN HFA) 108 (90 BASE) MCG/ACT inhaler Inhale 2 puffs into the lungs every 6 (six) hours as needed for wheezing.  1 Inhaler  6  . aspirin 81 MG tablet Take 81 mg by mouth daily.        . furosemide (LASIX) 80 MG tablet TAKE ONE TABLET BY MOUTH TWICE DAILY  60 tablet  6  .  gabapentin (NEURONTIN) 300 MG capsule Take 4 tablets by mouth two times a day.       . lovastatin (MEVACOR) 20 MG tablet Take 20 mg by mouth 2 (two) times daily.      . metFORMIN (GLUCOPHAGE) 500 MG tablet Take 500 mg by mouth 2 (two) times daily.       . metoprolol (LOPRESSOR) 50 MG tablet Take 25 mg by mouth 2 (two) times daily.      . Multiple Vitamins-Minerals (CENTRUM PO) Take 1 tablet by mouth daily.        Marland Kitchen NITROSTAT 0.4 MG SL tablet Place 0.4 mg under the tongue every 5 (five) minutes as needed.       . Omega-3 Fatty Acids (FISH OIL) 1000 MG CAPS Take 1 capsule by mouth daily.        . potassium chloride SA (K-DUR,KLOR-CON) 20 MEQ tablet Take 2 tablets (40 mEq total) by mouth daily.  60 tablet  3  . predniSONE (DELTASONE) 10 MG tablet Take 10 mg by mouth daily.      Marland Kitchen Respiratory Therapy Supplies (FLUTTER) DEVI Use 4 times daily  1 each  0  . SPIRIVA HANDIHALER 18 MCG inhalation capsule PLACE ONE CAPSULE IN MOUTHPIECE (HANDIHALER) AND INHALE IN MOUTH  ONCE A DAY.  30 each  6  . SYMBICORT 160-4.5 MCG/ACT inhaler INHALE 2 PUFFS INTO MOUTH TWICE DAILY  1 Inhaler  6  . warfarin (COUMADIN) 5 MG tablet TAKE AS DIRECTED  45 tablet  6  . DISCONTD: diltiazem (CARDIZEM CD) 240 MG 24 hr capsule       . DISCONTD: sulfamethoxazole-trimethoprim (BACTRIM DS) 800-160 MG per tablet

## 2011-10-14 ENCOUNTER — Ambulatory Visit (INDEPENDENT_AMBULATORY_CARE_PROVIDER_SITE_OTHER): Payer: Medicare Other | Admitting: *Deleted

## 2011-10-14 DIAGNOSIS — Z7901 Long term (current) use of anticoagulants: Secondary | ICD-10-CM

## 2011-10-14 DIAGNOSIS — I4891 Unspecified atrial fibrillation: Secondary | ICD-10-CM

## 2011-10-17 ENCOUNTER — Encounter: Payer: Medicare Other | Admitting: Vascular Surgery

## 2011-11-03 ENCOUNTER — Encounter: Payer: Self-pay | Admitting: Cardiology

## 2011-11-03 DIAGNOSIS — I739 Peripheral vascular disease, unspecified: Secondary | ICD-10-CM | POA: Insufficient documentation

## 2011-11-04 ENCOUNTER — Ambulatory Visit (INDEPENDENT_AMBULATORY_CARE_PROVIDER_SITE_OTHER): Payer: Medicare Other | Admitting: Cardiology

## 2011-11-04 ENCOUNTER — Encounter: Payer: Self-pay | Admitting: Cardiology

## 2011-11-04 ENCOUNTER — Ambulatory Visit (INDEPENDENT_AMBULATORY_CARE_PROVIDER_SITE_OTHER): Payer: Medicare Other | Admitting: *Deleted

## 2011-11-04 VITALS — BP 124/88 | HR 94 | Ht 69.0 in | Wt 250.0 lb

## 2011-11-04 DIAGNOSIS — R238 Other skin changes: Secondary | ICD-10-CM

## 2011-11-04 DIAGNOSIS — I4891 Unspecified atrial fibrillation: Secondary | ICD-10-CM

## 2011-11-04 DIAGNOSIS — T148XXA Other injury of unspecified body region, initial encounter: Secondary | ICD-10-CM

## 2011-11-04 DIAGNOSIS — I5032 Chronic diastolic (congestive) heart failure: Secondary | ICD-10-CM | POA: Insufficient documentation

## 2011-11-04 DIAGNOSIS — R609 Edema, unspecified: Secondary | ICD-10-CM

## 2011-11-04 DIAGNOSIS — I509 Heart failure, unspecified: Secondary | ICD-10-CM

## 2011-11-04 DIAGNOSIS — I739 Peripheral vascular disease, unspecified: Secondary | ICD-10-CM

## 2011-11-04 DIAGNOSIS — Z7901 Long term (current) use of anticoagulants: Secondary | ICD-10-CM

## 2011-11-04 MED ORDER — FUROSEMIDE 80 MG PO TABS: ORAL_TABLET | ORAL | Status: DC

## 2011-11-04 NOTE — Assessment & Plan Note (Signed)
The patient has been seen by Dr. Delford Field of pulmonary since his last visit. We're watching him very carefully. The patient knows that if he begins to deteriorate he should contact Dr. Lynelle Doctor office for early followup.

## 2011-11-04 NOTE — Assessment & Plan Note (Signed)
He does have severe peripheral vascular disease and this is being followed by vascular surgery.

## 2011-11-04 NOTE — Assessment & Plan Note (Signed)
His edema looks better wearing support hose. I'm pleased with this result.

## 2011-11-04 NOTE — Assessment & Plan Note (Signed)
Atrial fibrillation controlled. No change in therapy. 

## 2011-11-04 NOTE — Patient Instructions (Addendum)
Your physician recommends that you schedule a follow-up appointment in: 3 weeks. Your physician has recommended you make the following change in your medication: Increased furosemide to 120 mg twice daily through Friday and on Saturday morning (11/08/11);  start furosemide 120 mg in the morning and 80 mg in the pm. All other medications will remain the same. Your physician recommends that you return for lab work on November 4 th 2013 at [facility] for [employer].

## 2011-11-04 NOTE — Assessment & Plan Note (Signed)
The patient has chronic diastolic CHF. It is very difficult to assess him because of his severe lung disease. His weight is up since the last visit. He does not have edema today but he is wearing support hose religiously. I have pushed his diuretic from 80 twice a day up to 120 twice a day for 3 days and then to continue at 120 in the morning and 80 Later in the day. He will then have a followup chemistry lab in 6 days to followup his renal function and his potassium. I will then be in touch with him about the continued dosing of his diuretics.

## 2011-11-04 NOTE — Progress Notes (Signed)
HPI  Since I saw him last on September 22, 2011, he has been seen by vascular surgery. The plan is to watch his arterial disease. The plan is also to try to treat the venous disease with support hose. He is wearing his support hose in his legs look much better. He also says that the lesions on his feet are definitely healing. He continues to be short of breath. He says that he is close to his usual state. In talking in looking at him today I think he has some slight increased shortness of breath. His weight is increased. He does not have edema but he is wearing support hose. He tells me that when I increased his diuretic at his last visit, he did have increased urine output.  Allergies  Allergen Reactions  . Clopidogrel Bisulfate     REACTION: rash    Current Outpatient Prescriptions  Medication Sig Dispense Refill  . albuterol (PROVENTIL) (2.5 MG/3ML) 0.083% nebulizer solution Take 3 mLs (2.5 mg total) by nebulization every 6 (six) hours as needed for wheezing.  75 mL  12  . albuterol (VENTOLIN HFA) 108 (90 BASE) MCG/ACT inhaler Inhale 2 puffs into the lungs every 6 (six) hours as needed for wheezing.  1 Inhaler  6  . aspirin 81 MG tablet Take 81 mg by mouth daily.        . furosemide (LASIX) 80 MG tablet TAKE ONE TABLET BY MOUTH TWICE DAILY  60 tablet  6  . gabapentin (NEURONTIN) 300 MG capsule Take 4 tablets by mouth two times a day.       . lovastatin (MEVACOR) 20 MG tablet Take 20 mg by mouth 2 (two) times daily.      . metFORMIN (GLUCOPHAGE) 500 MG tablet Take 500 mg by mouth 2 (two) times daily.       . metoprolol (LOPRESSOR) 50 MG tablet Take 25 mg by mouth 2 (two) times daily.      . Multiple Vitamins-Minerals (CENTRUM PO) Take 1 tablet by mouth daily.        . Omega-3 Fatty Acids (FISH OIL) 1000 MG CAPS Take 1 capsule by mouth daily.        . potassium chloride SA (K-DUR,KLOR-CON) 20 MEQ tablet Take 20 mEq by mouth 2 (two) times daily.      . predniSONE (DELTASONE) 10 MG tablet  Take 10 mg by mouth daily.      Marland Kitchen Respiratory Therapy Supplies (FLUTTER) DEVI Use 4 times daily  1 each  0  . SPIRIVA HANDIHALER 18 MCG inhalation capsule PLACE ONE CAPSULE IN MOUTHPIECE (HANDIHALER) AND INHALE IN MOUTH ONCE A DAY.  30 each  6  . SYMBICORT 160-4.5 MCG/ACT inhaler INHALE 2 PUFFS INTO MOUTH TWICE DAILY  1 Inhaler  6  . DISCONTD: potassium chloride SA (K-DUR,KLOR-CON) 20 MEQ tablet Take 2 tablets (40 mEq total) by mouth daily.  60 tablet  3  . NITROSTAT 0.4 MG SL tablet Place 0.4 mg under the tongue every 5 (five) minutes as needed.       . warfarin (COUMADIN) 5 MG tablet TAKE AS DIRECTED  45 tablet  6    History   Social History  . Marital Status: Married    Spouse Name: N/A    Number of Children: N/A  . Years of Education: N/A   Occupational History  . Dance movement psychotherapist    Social History Main Topics  . Smoking status: Former Smoker -- 2.0 packs/day for 40 years  Types: Cigarettes    Quit date: 01/06/1998  . Smokeless tobacco: Never Used  . Alcohol Use: No  . Drug Use: No  . Sexually Active: Not on file   Other Topics Concern  . Not on file   Social History Narrative  . No narrative on file    Family History  Problem Relation Age of Onset  . Heart disease Mother   . Cancer Father     throat    Past Medical History  Diagnosis Date  . COPD with emphysema     chronic Dyspnea, followed by Dr. Dennie Bible. [place], oxygen at night started 11/2008  . Atrial fibrillation     Permanent  . Warfarin anticoagulation     Coumadin  . Right ventricular dysfunction     Right Ventricle, mild moderately dilated by Echo 06/2008  . CAD (coronary artery disease)     .MI with a Taxus stent in 2004..  /   nuclear... June, 2010.. small scar or slight ischemia inferior wall  . Diabetes mellitus   . Hyperlipemia   . Obesity   . Peripheral edema     Mild  . Hypertension     relative hypotension... August, 2011... Cozaar stopped  . Foot pain     Significant neuropathy has been  evaluated at [facility].  . Ejection fraction     EF 60%, echo, 2010, technically difficult study  . Drug allergy     Plavix causes severe rash  . Pulmonary hypertension     PA pressure 45-50 mmHg, echo, June, 2013, patient hospitalized with a COPD exacerbation at that time.  . Constipation     July, 2013  . Blisters of multiple sites     Patient has blisters with clear liquid. There are 5 or more on the great toe and second toe of the right foot. There is one on the left great toe.  Marland Kitchen PAD (peripheral artery disease)     Vascular surgery consultation September, 2013    Past Surgical History  Procedure Date  . Rotator cuff repair     x 3  . Knee surgery     Patient Active Problem List  Diagnosis  . Foot pain  . Right ventricular dysfunction  . COPD with emphysema Gold D  . Atrial fibrillation  . Warfarin anticoagulation  . CAD (coronary artery disease)  . Hyperlipemia  . Obesity  . Peripheral edema  . Hypertension  . Ejection fraction  . Pulmonary hypertension  . Drug allergy  . Respiratory failure  . Constipation  . Blisters of multiple sites  . Peripheral arterial occlusive disease  . Venous insufficiency  . Bilateral leg edema  . PAD (peripheral artery disease)    ROS   Patient denies fever, chills, headache, sweats, rash, change in vision, change in hearing, chest pain, nausea vomiting, urinary symptoms. All of the systems are reviewed and are negative.  PHYSICAL EXAM   The patient has a wet cough. This is chronic. He is stable. He is oriented to person time and place. Affect is normal. Lungs reveal marked decreased breath sounds. Cardiac exam reveals S1 and S2. There no clicks or significant murmurs. There is no respiratory distress at rest. The abdomen is protuberant. He is wearing support hose and does not have significant edema at this time. The lesions on his feet are improving significantly since the last visit.  Filed Vitals:    11/04/11 1258  BP: 124/88  Pulse: 94  Height: 5'  9" (1.753 m)  Weight: 250 lb (113.399 kg)  SpO2: 93%     ASSESSMENT & PLAN

## 2011-11-04 NOTE — Assessment & Plan Note (Signed)
It appears that the skin lesions were related to venous disease. They are improving with his support hose.

## 2011-11-06 ENCOUNTER — Encounter: Payer: Self-pay | Admitting: Vascular Surgery

## 2011-11-07 ENCOUNTER — Ambulatory Visit (INDEPENDENT_AMBULATORY_CARE_PROVIDER_SITE_OTHER): Payer: Medicare Other | Admitting: Vascular Surgery

## 2011-11-07 ENCOUNTER — Encounter: Payer: Self-pay | Admitting: Vascular Surgery

## 2011-11-07 VITALS — BP 129/83 | HR 102 | Ht 69.0 in | Wt 247.0 lb

## 2011-11-07 DIAGNOSIS — I872 Venous insufficiency (chronic) (peripheral): Secondary | ICD-10-CM

## 2011-11-07 DIAGNOSIS — I739 Peripheral vascular disease, unspecified: Secondary | ICD-10-CM

## 2011-11-07 NOTE — Addendum Note (Signed)
Addended by: Sharee Pimple on: 11/07/2011 04:19 PM   Modules accepted: Orders

## 2011-11-07 NOTE — Progress Notes (Signed)
[facility] [place]  Established Critical Limb Ischemia Patient  History of Present Illness  [name] is a 71 y.o. (02-Dec-1940) male who presents with chief complaint: decreased swelling.  Patient notes complete healing of his blistered areas on both legs.  he patient has no rest pain or intermittent claudication.  His ambulation is limited by his severe COPD.  His radicular sx are stable at this point.  The patient's treatment regimen currently included: maximal medical management and compression stocking.  Past Medical History, Past Surgical History, Social History, Family History, Medications, Allergies, and Review of Systems are unchanged from previous evaluation on 10/10/11.  Physical Examination  Filed Vitals:   11/07/11 0901  BP: 129/83  Pulse: 102  Height: 5\' 9"  (1.753 m)  Weight: 247 lb (112.038 kg)  SpO2: 94%   Body mass index is 36.48 kg/(m^2).   General: A&O x 3, WD , obese  Eyes: PERRLA, EOMI  Pulmonary: Sym exp, good air movt, CTAB, no rales, rhonchi, & wheezing  Cardiac: RRR, Nl S1, S2, no Murmurs, rubs or gallops  Vascular:  Vessel  Right  Left   Radial  Palpable  Palpable   Ulnar  Palpable  Palpable   Brachial  Palpable  Palpable   Carotid  Palpable, without bruit  Palpable, without bruit   Aorta  Not palpable due to obesity  N/A   Femoral  Palpable  Palpable   Popliteal  Not palpable  Not palpable   PT  Not Palpable  Not Palpable   DP  Faintly Palpable  Faintly Palpable    Gastrointestinal: soft, NTND, -G/R, - HSM, - masses, - CVAT B, large pannus  Musculoskeletal: M/S 5/5 throughout , Extremities without ischemic changes , B cyanotic foot dependent on position  Neurologic: Pain and light touch intact in extremities , Motor exam as listed above  Medical Decision Making  [name] is a 71 y.o. male who presents with: BLE CVI, BLE PAD.   Patient has healed his wounds as expected  Swelling is improved with lasix  and compression stockings.  I would continue with the stocking indefinitely.  I would recheck ABI in 3 month to monitor the PAD progression.  I do not think the risks of angiography can be justified at this point as the patient has healed his wounds.  Ultimately, I don't think his PAD will limit his ADL as his COPD is so severe.  I discussed in depth with the patient the nature of atherosclerosis, and emphasized the importance of maximal medical management including strict control of blood pressure, blood glucose, and lipid levels, antiplatelet agents, obtaining regular exercise, and cessation of smoking.  The patient is aware that without maximal medical management the underlying atherosclerotic disease process will progress, limiting the benefit of any interventions.  Thank you for allowing [place] to participate in this patient's care.  Leonides Sake, MD [facility] [place] Office: [phone] Pager: [phone]  11/07/2011, 9:24 AM

## 2011-11-12 ENCOUNTER — Telehealth: Payer: Self-pay | Admitting: *Deleted

## 2011-11-12 NOTE — Telephone Encounter (Signed)
Message copied by Eustace Moore on Wed Nov 12, 2011  4:09 PM ------      Message from: [place], [place] D      Created: Tue Nov 11, 2011  2:33 PM       Please increase his potassium dose by an additional 20 mEq by mouth daily

## 2011-11-12 NOTE — Telephone Encounter (Signed)
Patient informed. 

## 2011-11-21 ENCOUNTER — Ambulatory Visit (INDEPENDENT_AMBULATORY_CARE_PROVIDER_SITE_OTHER): Payer: Medicare Other | Admitting: Cardiology

## 2011-11-21 ENCOUNTER — Encounter: Payer: Self-pay | Admitting: Cardiology

## 2011-11-21 VITALS — BP 130/70 | HR 109 | Ht 69.0 in | Wt 252.0 lb

## 2011-11-21 DIAGNOSIS — I509 Heart failure, unspecified: Secondary | ICD-10-CM

## 2011-11-21 DIAGNOSIS — I5032 Chronic diastolic (congestive) heart failure: Secondary | ICD-10-CM

## 2011-11-21 DIAGNOSIS — I4891 Unspecified atrial fibrillation: Secondary | ICD-10-CM

## 2011-11-21 DIAGNOSIS — Z7901 Long term (current) use of anticoagulants: Secondary | ICD-10-CM

## 2011-11-21 DIAGNOSIS — I2510 Atherosclerotic heart disease of native coronary artery without angina pectoris: Secondary | ICD-10-CM

## 2011-11-21 MED ORDER — DIGOXIN 125 MCG PO TABS: ORAL_TABLET | ORAL | Status: DC

## 2011-11-21 NOTE — Assessment & Plan Note (Signed)
Coronary disease is stable. No change in therapy. 

## 2011-11-21 NOTE — Assessment & Plan Note (Signed)
Patient has permanent atrial fibrillation. I'm going to add digoxin to see if slowing his heart rate helps. Will do this carefully with his renal function.

## 2011-11-21 NOTE — Progress Notes (Signed)
Patient ID: [name], male   DOB: 02-14-1940, 71 y.o.   MRN: [id number]   HPI  Patient returns today to followup his shortness of breath. He has severe lung disease. I do believe there is a volume component to his shortness of breath. I had push his diuretic a little higher. He did have slight increase in his creatinine. It has been difficult to decide how much fluid he should take in. The patient's atrial fib rate is increased. I suspect this is due to his lung disease. I am wondering if we can help him feel better by slowing his rate a little bit.  Allergies  Allergen Reactions  . Clopidogrel Bisulfate     REACTION: rash    Current Outpatient Prescriptions  Medication Sig Dispense Refill  . albuterol (PROVENTIL) (2.5 MG/3ML) 0.083% nebulizer solution Take 3 mLs (2.5 mg total) by nebulization every 6 (six) hours as needed for wheezing.  75 mL  12  . albuterol (VENTOLIN HFA) 108 (90 BASE) MCG/ACT inhaler Inhale 2 puffs into the lungs every 6 (six) hours as needed for wheezing.  1 Inhaler  6  . aspirin 81 MG tablet Take 81 mg by mouth daily.        . furosemide (LASIX) 80 MG tablet TAKE 1&1/2 TWICE DAILY (120 MG) UNTIL Saturday 11/08/11 THEN; START 1&1/2 TABLETS IN THE AM AND 1 TABLET IN THE PM      . gabapentin (NEURONTIN) 300 MG capsule Take 4 tablets by mouth two times a day.       . lovastatin (MEVACOR) 20 MG tablet Take 20 mg by mouth 2 (two) times daily.      . metFORMIN (GLUCOPHAGE) 500 MG tablet Take 500 mg by mouth 2 (two) times daily.       . metoprolol (LOPRESSOR) 50 MG tablet Take 25 mg by mouth 2 (two) times daily.      . Multiple Vitamins-Minerals (CENTRUM PO) Take 1 tablet by mouth daily.        Marland Kitchen NITROSTAT 0.4 MG SL tablet Place 0.4 mg under the tongue every 5 (five) minutes as needed.       . Omega-3 Fatty Acids (FISH OIL) 1000 MG CAPS Take 1 capsule by mouth daily.        . potassium chloride SA (K-DUR,KLOR-CON) 20 MEQ tablet Take 40 mEq by mouth every morning. & 1 in  the evening      . predniSONE (DELTASONE) 10 MG tablet Take 10 mg by mouth daily.      Marland Kitchen Respiratory Therapy Supplies (FLUTTER) DEVI Use 4 times daily  1 each  0  . SPIRIVA HANDIHALER 18 MCG inhalation capsule PLACE ONE CAPSULE IN MOUTHPIECE (HANDIHALER) AND INHALE IN MOUTH ONCE A DAY.  30 each  6  . SYMBICORT 160-4.5 MCG/ACT inhaler INHALE 2 PUFFS INTO MOUTH TWICE DAILY  1 Inhaler  6  . warfarin (COUMADIN) 5 MG tablet TAKE AS DIRECTED  45 tablet  6    History   Social History  . Marital Status: Married    Spouse Name: N/A    Number of Children: N/A  . Years of Education: N/A   Occupational History  . Dance movement psychotherapist    Social History Main Topics  . Smoking status: Former Smoker -- 2.0 packs/day for 40 years    Types: Cigarettes    Quit date: 01/06/1998  . Smokeless tobacco: Never Used  . Alcohol Use: No  . Drug Use: No  . Sexually Active: Not  on file   Other Topics Concern  . Not on file   Social History Narrative  . No narrative on file    Family History  Problem Relation Age of Onset  . Heart disease Mother   . Cancer Father     throat    Past Medical History  Diagnosis Date  . COPD with emphysema     chronic Dyspnea, followed by Dr. Dennie Bible. [place], oxygen at night started 11/2008  . Atrial fibrillation     Permanent  . Warfarin anticoagulation     Coumadin  . Right ventricular dysfunction     Right Ventricle, mild moderately dilated by Echo 06/2008  . CAD (coronary artery disease)     .MI with a Taxus stent in 2004..  /   nuclear... June, 2010.. small scar or slight ischemia inferior wall  . Diabetes mellitus   . Hyperlipemia   . Obesity   . Peripheral edema     Mild  . Hypertension     relative hypotension... August, 2011... Cozaar stopped  . Foot pain     Significant neuropathy has been evaluated at [facility].  . Ejection fraction     EF 60%, echo, 2010, technically difficult study  . Drug allergy     Plavix causes severe rash    . Pulmonary hypertension     PA pressure 45-50 mmHg, echo, June, 2013, patient hospitalized with a COPD exacerbation at that time.  . Constipation     July, 2013  . Blisters of multiple sites     Patient has blisters with clear liquid. There are 5 or more on the great toe and second toe of the right foot. There is one on the left great toe.  Marland Kitchen PAD (peripheral artery disease)     Vascular surgery consultation September, 2013  . Chronic diastolic CHF (congestive heart failure)     Patient requires diuretics.    Past Surgical History  Procedure Date  . Rotator cuff repair     x 3  . Knee surgery     Patient Active Problem List  Diagnosis  . Foot pain  . Right ventricular dysfunction  . COPD with emphysema Gold D  . Atrial fibrillation  . Warfarin anticoagulation  . CAD (coronary artery disease)  . Hyperlipemia  . Obesity  . Peripheral edema  . Hypertension  . Ejection fraction  . Pulmonary hypertension  . Drug allergy  . Respiratory failure  . Constipation  . Blisters of multiple sites  . Peripheral arterial occlusive disease  . Venous insufficiency  . Bilateral leg edema  . PAD (peripheral artery disease)  . Chronic diastolic CHF (congestive heart failure)  . Chronic venous insufficiency    ROS   Patient denies fever, chills, headache, sweats, rash, change in vision, change in hearing, chest pain, cough, nausea vomiting, urinary symptoms. All other systems are reviewed and are negative.  PHYSICAL EXAM  Patient is overweight. He has slight respiratory distress sitting still. Abdomen is protuberant. There is no jugulovenous distention. Lung sounds are distant. Cardiac exam reveals distant heart sounds. The abdomen is protuberant. The patient's weight is up a few pounds since his last visit.  Filed Vitals:   11/21/11 1356  BP: 130/70  Pulse: 109  Height: 5\' 9"  (1.753 m)  Weight: 252 lb (114.306 kg)  SpO2: 92%    ASSESSMENT & PLAN

## 2011-11-21 NOTE — Assessment & Plan Note (Signed)
Coumadin will be continued. 

## 2011-11-21 NOTE — Patient Instructions (Addendum)
Your physician recommends that you schedule a follow-up appointment in 1 week on November 22,2013 with Dr. Myrtis Ser. Your physician has recommended you make the following change in your medication: Start digoxin .125 mg taking 2 tablets twice daily for 2 day, and on the 3rd day, start 1/2 tablet daily. Your new prescription has been sent to your pharmacy. All other medications will remain the same.

## 2011-11-21 NOTE — Assessment & Plan Note (Signed)
We need to keep  His diuretic dose up even if this compromises renal function a small amount.

## 2011-11-24 ENCOUNTER — Other Ambulatory Visit: Payer: Self-pay | Admitting: Cardiology

## 2011-11-24 MED ORDER — POTASSIUM CHLORIDE CRYS ER 20 MEQ PO TBCR: 40.0000 meq | EXTENDED_RELEASE_TABLET | Freq: Every morning | ORAL | Status: DC

## 2011-11-28 ENCOUNTER — Ambulatory Visit (INDEPENDENT_AMBULATORY_CARE_PROVIDER_SITE_OTHER): Payer: Medicare Other | Admitting: Cardiology

## 2011-11-28 ENCOUNTER — Encounter: Payer: Self-pay | Admitting: Cardiology

## 2011-11-28 ENCOUNTER — Ambulatory Visit (INDEPENDENT_AMBULATORY_CARE_PROVIDER_SITE_OTHER): Payer: Medicare Other | Admitting: *Deleted

## 2011-11-28 VITALS — BP 126/92 | HR 62 | Ht 69.0 in | Wt 248.0 lb

## 2011-11-28 DIAGNOSIS — I4891 Unspecified atrial fibrillation: Secondary | ICD-10-CM

## 2011-11-28 DIAGNOSIS — R609 Edema, unspecified: Secondary | ICD-10-CM

## 2011-11-28 DIAGNOSIS — I5032 Chronic diastolic (congestive) heart failure: Secondary | ICD-10-CM

## 2011-11-28 DIAGNOSIS — Z7901 Long term (current) use of anticoagulants: Secondary | ICD-10-CM

## 2011-11-28 DIAGNOSIS — I509 Heart failure, unspecified: Secondary | ICD-10-CM

## 2011-11-28 NOTE — Patient Instructions (Addendum)
Your physician recommends that you schedule a follow-up appointment in: December 23, 2011 @10 :15 am with Dr. Myrtis Ser. Your physician recommends that you continue on your current medications as directed. Please refer to the Current Medication list given to you today.  Your physician recommends that you return for lab work in: today at [facility] for [employer].

## 2011-11-28 NOTE — Assessment & Plan Note (Signed)
I think we are getting better rate control with the addition of digoxin. He is on only a low dose. He has not done yet a full week. Therefore I will not yet chest a digoxin level.

## 2011-11-28 NOTE — Progress Notes (Signed)
HPI  Patient is seen to followup atrial fibrillation. I saw him last on November 21, 2011. I was very concerned about his status. He seemed somewhat volume overloaded. Also it seems that we might push for better heart rate control. I decided to add a small dose of digoxin. This was done. His heart rate is under better control today and he clearly looks better. In addition he has lost a few pounds without changing his diuretic dose. However I do believe he has diaries to. All of this may be related to somewhat better atrial fib rate control.  Allergies  Allergen Reactions  . Clopidogrel Bisulfate     REACTION: rash    Current Outpatient Prescriptions  Medication Sig Dispense Refill  . albuterol (PROVENTIL) (2.5 MG/3ML) 0.083% nebulizer solution Take 3 mLs (2.5 mg total) by nebulization every 6 (six) hours as needed for wheezing.  75 mL  12  . albuterol (VENTOLIN HFA) 108 (90 BASE) MCG/ACT inhaler Inhale 2 puffs into the lungs every 6 (six) hours as needed for wheezing.  1 Inhaler  6  . aspirin 81 MG tablet Take 81 mg by mouth daily.        . digoxin (LANOXIN) 0.125 MG tablet Take 2 tablets twice daily for 2 days; then reduce to 1/2 tablet daily  90 tablet  3  . furosemide (LASIX) 80 MG tablet TAKE 1&1/2 TWICE DAILY (120 MG) UNTIL Saturday 11/08/11 THEN; START 1&1/2 TABLETS IN THE AM AND 1 TABLET IN THE PM      . gabapentin (NEURONTIN) 300 MG capsule Take 4 tablets by mouth two times a day.       . lovastatin (MEVACOR) 20 MG tablet Take 20 mg by mouth 2 (two) times daily.      . metFORMIN (GLUCOPHAGE) 500 MG tablet Take 500 mg by mouth 2 (two) times daily.       . metoprolol (LOPRESSOR) 50 MG tablet Take 25 mg by mouth 2 (two) times daily.      . Multiple Vitamins-Minerals (CENTRUM PO) Take 1 tablet by mouth daily.        Marland Kitchen NITROSTAT 0.4 MG SL tablet Place 0.4 mg under the tongue every 5 (five) minutes as needed.       . Omega-3 Fatty Acids (FISH OIL) 1000 MG CAPS Take 1 capsule by mouth  daily.        . potassium chloride SA (K-DUR,KLOR-CON) 20 MEQ tablet Take 2 tablets (40 mEq total) by mouth every morning. & 1 in the evening  90 tablet  3  . predniSONE (DELTASONE) 10 MG tablet Take 10 mg by mouth daily.      Marland Kitchen Respiratory Therapy Supplies (FLUTTER) DEVI Use 4 times daily  1 each  0  . SPIRIVA HANDIHALER 18 MCG inhalation capsule PLACE ONE CAPSULE IN MOUTHPIECE (HANDIHALER) AND INHALE IN MOUTH ONCE A DAY.  30 each  6  . SYMBICORT 160-4.5 MCG/ACT inhaler INHALE 2 PUFFS INTO MOUTH TWICE DAILY  1 Inhaler  6  . warfarin (COUMADIN) 5 MG tablet TAKE AS DIRECTED  45 tablet  6    History   Social History  . Marital Status: Married    Spouse Name: N/A    Number of Children: N/A  . Years of Education: N/A   Occupational History  . Dance movement psychotherapist    Social History Main Topics  . Smoking status: Former Smoker -- 2.0 packs/day for 40 years    Types: Cigarettes    Quit date:  01/06/1998  . Smokeless tobacco: Never Used  . Alcohol Use: No  . Drug Use: No  . Sexually Active: Not on file   Other Topics Concern  . Not on file   Social History Narrative  . No narrative on file    Family History  Problem Relation Age of Onset  . Heart disease Mother   . Cancer Father     throat    Past Medical History  Diagnosis Date  . COPD with emphysema     chronic Dyspnea, followed by Dr. Dennie Bible. [place], oxygen at night started 11/2008  . Atrial fibrillation     Permanent  . Warfarin anticoagulation     Coumadin  . Right ventricular dysfunction     Right Ventricle, mild moderately dilated by Echo 06/2008  . CAD (coronary artery disease)     .MI with a Taxus stent in 2004..  /   nuclear... June, 2010.. small scar or slight ischemia inferior wall  . Diabetes mellitus   . Hyperlipemia   . Obesity   . Peripheral edema     Mild  . Hypertension     relative hypotension... August, 2011... Cozaar stopped  . Foot pain     Significant neuropathy has been evaluated at [facility].  . Ejection fraction     EF 60%, echo, 2010, technically difficult study  . Drug allergy     Plavix causes severe rash  . Pulmonary hypertension     PA pressure 45-50 mmHg, echo, June, 2013, patient hospitalized with a COPD exacerbation at that time.  . Constipation     July, 2013  . Blisters of multiple sites     Patient has blisters with clear liquid. There are 5 or more on the great toe and second toe of the right foot. There is one on the left great toe.  Marland Kitchen PAD (peripheral artery disease)     Vascular surgery consultation September, 2013  . Chronic diastolic CHF (congestive heart failure)     Patient requires diuretics.    Past Surgical History  Procedure Date  . Rotator cuff repair     x 3  . Knee surgery     Patient Active Problem List  Diagnosis  . Foot pain  . Right ventricular dysfunction  . COPD with emphysema Gold D  . Atrial fibrillation  . Warfarin anticoagulation  . CAD (coronary artery disease)  . Hyperlipemia  . Obesity  . Peripheral edema  . Hypertension  . Ejection fraction  . Pulmonary hypertension  . Drug allergy  . Respiratory failure  . Constipation  . Blisters of multiple sites  . Peripheral arterial occlusive disease  . Venous insufficiency  . Bilateral leg edema  . PAD (peripheral artery disease)  . Chronic diastolic CHF (congestive heart failure)  . Chronic venous insufficiency    ROS   Patient denies fever, chills, headache, sweats, rash, change in vision, change in hearing, chest pain, cough, nausea vomiting, urinary symptoms. All other systems are reviewed and are negative.  PHYSICAL EXAM   Patient is overweight but stable. He is down a few pounds since his last visit. He is oriented to person time and place. Affect is normal. Lungs sounds are distant. There is no respiratory distress today. Cardiac exam reveals S1 and S2. Heart sounds are distant. The abdomen is protuberant. He does not have any significant  peripheral edema today. He is wearing his support hose.  Filed Vitals:   11/28/11 1053  BP: 126/92  Pulse: 62  Height: 5\' 9"  (1.753 m)  Weight: 248 lb (112.492 kg)  SpO2: 90%     ASSESSMENT & PLAN

## 2011-11-28 NOTE — Assessment & Plan Note (Signed)
His edema looks under control today. No change in therapy.

## 2011-11-28 NOTE — Assessment & Plan Note (Signed)
I think overall his diastolic CHF is stable today. No change in therapy. I will check a chemistry level today. See back in several weeks.

## 2011-12-02 ENCOUNTER — Telehealth: Payer: Self-pay | Admitting: *Deleted

## 2011-12-02 NOTE — Telephone Encounter (Signed)
Patient informed. 

## 2011-12-02 NOTE — Telephone Encounter (Signed)
Message copied by Eustace Moore on Tue Dec 02, 2011  1:22 PM ------      Message from: Myrtis Ser, [place] D      Created: Mon Dec 01, 2011  5:48 PM       The lab looks good

## 2011-12-10 ENCOUNTER — Encounter: Payer: Self-pay | Admitting: Critical Care Medicine

## 2011-12-10 ENCOUNTER — Ambulatory Visit (INDEPENDENT_AMBULATORY_CARE_PROVIDER_SITE_OTHER): Payer: Medicare Other | Admitting: Critical Care Medicine

## 2011-12-10 VITALS — BP 132/82 | HR 82 | Temp 98.2°F | Ht 65.0 in | Wt 252.2 lb

## 2011-12-10 DIAGNOSIS — J439 Emphysema, unspecified: Secondary | ICD-10-CM

## 2011-12-10 DIAGNOSIS — J438 Other emphysema: Secondary | ICD-10-CM

## 2011-12-10 NOTE — Progress Notes (Signed)
Subjective:    Patient ID: [name], male    DOB: 1940-11-29, 71 y.o.   MRN: [id number]  HPI  [name] is a 71 y.o.  white male, history of chronic obstructive  lung disease, primary emphysematous component  12/09/11 Since her last visit this patient has been stable with his dyspnea. There is minimal mucus production. There is no chest pain. There is some lower extremity edema. The patient maintains oxygen at night. Patient maintains nebulized therapy during the day. Pt denies any significant sore throat, nasal congestion or excess secretions, fever, chills, sweats, unintended weight loss, pleurtic or exertional chest pain, orthopnea PND, or leg swelling Pt denies any increase in rescue therapy over baseline, denies waking up needing it or having any early am or nocturnal exacerbations of coughing/wheezing/or dyspnea. Pt also denies any obvious fluctuation in symptoms with  weather or environmental change or other alleviating or aggravating factors    Past Medical History  Diagnosis Date  . COPD with emphysema     chronic Dyspnea, followed by Dr. Dennie Bible. [place], oxygen at night started 11/2008  . Atrial fibrillation     Permanent  . Warfarin anticoagulation     Coumadin  . Right ventricular dysfunction     Right Ventricle, mild moderately dilated by Echo 06/2008  . CAD (coronary artery disease)     .MI with a Taxus stent in 2004..  /   nuclear... June, 2010.. small scar or slight ischemia inferior wall  . Diabetes mellitus   . Hyperlipemia   . Obesity   . Peripheral edema     Mild  . Hypertension     relative hypotension... August, 2011... Cozaar stopped  . Foot pain     Significant neuropathy has been evaluated at [facility].  . Ejection fraction     EF 60%, echo, 2010, technically difficult study  . Drug allergy     Plavix causes severe rash  . Pulmonary hypertension     PA pressure 45-50 mmHg, echo, June, 2013, patient hospitalized with a COPD  exacerbation at that time.  . Constipation     July, 2013  . Blisters of multiple sites     Patient has blisters with clear liquid. There are 5 or more on the great toe and second toe of the right foot. There is one on the left great toe.  Marland Kitchen PAD (peripheral artery disease)     Vascular surgery consultation September, 2013  . Chronic diastolic CHF (congestive heart failure)     Patient requires diuretics.     Family History  Problem Relation Age of Onset  . Heart disease Mother   . Cancer Father     throat     History   Social History  . Marital Status: Married    Spouse Name: N/A    Number of Children: N/A  . Years of Education: N/A   Occupational History  . Dance movement psychotherapist    Social History Main Topics  . Smoking status: Former Smoker -- 2.0 packs/day for 40 years    Types: Cigarettes    Quit date: 01/06/1998  . Smokeless tobacco: Never Used  . Alcohol Use: No  . Drug Use: No  . Sexually Active: Not on file   Other Topics Concern  . Not on file   Social History Narrative  . No narrative on file     Allergies  Allergen Reactions  . Clopidogrel Bisulfate     REACTION: rash  Outpatient Prescriptions Prior to Visit  Medication Sig Dispense Refill  . albuterol (PROVENTIL) (2.5 MG/3ML) 0.083% nebulizer solution Take 3 mLs (2.5 mg total) by nebulization every 6 (six) hours as needed for wheezing.  75 mL  12  . albuterol (VENTOLIN HFA) 108 (90 BASE) MCG/ACT inhaler Inhale 2 puffs into the lungs every 6 (six) hours as needed for wheezing.  1 Inhaler  6  . aspirin 81 MG tablet Take 81 mg by mouth daily.        . furosemide (LASIX) 80 MG tablet 1&1/2 TABLETS IN THE AM AND 1 TABLET IN THE PM      . gabapentin (NEURONTIN) 300 MG capsule Take 4 tablets by mouth two times a day.       . lovastatin (MEVACOR) 20 MG tablet Take 20 mg by mouth 2 (two) times daily.      . metFORMIN (GLUCOPHAGE) 500 MG tablet Take 500 mg by mouth 2 (two) times daily.       . metoprolol  (LOPRESSOR) 50 MG tablet Take 25 mg by mouth 2 (two) times daily.      . Multiple Vitamins-Minerals (CENTRUM PO) Take 1 tablet by mouth daily.        Marland Kitchen NITROSTAT 0.4 MG SL tablet Place 0.4 mg under the tongue every 5 (five) minutes as needed.       . Omega-3 Fatty Acids (FISH OIL) 1000 MG CAPS Take 1 capsule by mouth daily.        . potassium chloride SA (K-DUR,KLOR-CON) 20 MEQ tablet Take 2 tablets (40 mEq total) by mouth every morning. & 1 in the evening  90 tablet  3  . predniSONE (DELTASONE) 10 MG tablet Take 10 mg by mouth daily.      Marland Kitchen Respiratory Therapy Supplies (FLUTTER) DEVI Use 4 times daily  1 each  0  . SPIRIVA HANDIHALER 18 MCG inhalation capsule PLACE ONE CAPSULE IN MOUTHPIECE (HANDIHALER) AND INHALE IN MOUTH ONCE A DAY.  30 each  6  . SYMBICORT 160-4.5 MCG/ACT inhaler INHALE 2 PUFFS INTO MOUTH TWICE DAILY  1 Inhaler  6  . digoxin (LANOXIN) 0.125 MG tablet Take 2 tablets twice daily for 2 days; then reduce to 1/2 tablet daily  90 tablet  3  . warfarin (COUMADIN) 5 MG tablet TAKE AS DIRECTED  45 tablet  6      Review of Systems  Constitutional:   No  weight loss, night sweats,  Fevers, chills, fatigue, lassitude. HEENT:   No headaches,  Difficulty swallowing,  Tooth/dental problems,  Sore throat,                No sneezing, itching, ear ache, nasal congestion, post nasal drip,   CV:  No chest pain,  Orthopnea, PND, swelling in lower extremities, anasarca, dizziness, palpitations  GI  No heartburn, indigestion, abdominal pain, nausea, vomiting, diarrhea, change in bowel habits, loss of appetite  Resp: Notes  shortness of breath with exertion not at rest.  No excess mucus, notes  white mucus,  productive cough,  No non-productive cough,  No coughing up of blood.  No change in color of mucus.  No wheezing.  No chest wall deformity  Skin: no rash or lesions.  GU: no dysuria, change in color of urine, no urgency or frequency.  No flank pain.  MS:  No joint pain or swelling.   No decreased range of motion.  No back pain.  Psych:  No change in mood or affect. No  depression or anxiety.  No memory loss.     Objective:   Physical Exam  Filed Vitals:   12/10/11 1517  BP: 132/82  Pulse: 82  Temp: 98.2 F (36.8 C)  TempSrc: Oral  Height: 5\' 5"  (1.651 m)  Weight: 252 lb 3.2 oz (114.397 kg)  SpO2: 92%    Gen: Pleasant, well-nourished, in no distress,  normal affect  ENT: No lesions,  mouth clear,  oropharynx clear, no postnasal drip  Neck: No JVD, no TMG, no carotid bruits  Lungs: No use of accessory muscles, no dullness to percussion, distant BS,   Cardiovascular: RRR, heart sounds normal, no murmur or gallops, no peripheral edema  Abdomen: soft and NT, no HSM,  BS normal  Musculoskeletal: No deformities, no cyanosis or clubbing  Neuro: alert, non focal  Skin: Warm, no lesions or rashes        Assessment & Plan:   COPD with emphysema Gold D Gold stage D. COPD with primary emphysematous component maintain nocturnal oxygen therapy Stable at this time Plan No change in current medication profile Return 3 months    Updated Medication List Outpatient Encounter Prescriptions as of 12/10/2011  Medication Sig Dispense Refill  . albuterol (PROVENTIL) (2.5 MG/3ML) 0.083% nebulizer solution Take 3 mLs (2.5 mg total) by nebulization every 6 (six) hours as needed for wheezing.  75 mL  12  . albuterol (VENTOLIN HFA) 108 (90 BASE) MCG/ACT inhaler Inhale 2 puffs into the lungs every 6 (six) hours as needed for wheezing.  1 Inhaler  6  . amoxicillin (AMOXIL) 500 MG capsule Take 1 tablet by mouth Three times a day.      Marland Kitchen aspirin 81 MG tablet Take 81 mg by mouth daily.        . digoxin (LANOXIN) 0.125 MG tablet Take 0.125 mg by mouth daily. Take 1/2 tablet once a day      . furosemide (LASIX) 80 MG tablet 1&1/2 TABLETS IN THE AM AND 1 TABLET IN THE PM      . gabapentin (NEURONTIN) 300 MG capsule Take 4 tablets by mouth two times a day.       .  lovastatin (MEVACOR) 20 MG tablet Take 20 mg by mouth 2 (two) times daily.      . metFORMIN (GLUCOPHAGE) 500 MG tablet Take 500 mg by mouth 2 (two) times daily.       . metoprolol (LOPRESSOR) 50 MG tablet Take 25 mg by mouth 2 (two) times daily.      . Multiple Vitamins-Minerals (CENTRUM PO) Take 1 tablet by mouth daily.        Marland Kitchen NITROSTAT 0.4 MG SL tablet Place 0.4 mg under the tongue every 5 (five) minutes as needed.       . Omega-3 Fatty Acids (FISH OIL) 1000 MG CAPS Take 1 capsule by mouth daily.        . potassium chloride SA (K-DUR,KLOR-CON) 20 MEQ tablet Take 2 tablets (40 mEq total) by mouth every morning. & 1 in the evening  90 tablet  3  . predniSONE (DELTASONE) 10 MG tablet Take 10 mg by mouth daily.      Marland Kitchen Respiratory Therapy Supplies (FLUTTER) DEVI Use 4 times daily  1 each  0  . SPIRIVA HANDIHALER 18 MCG inhalation capsule PLACE ONE CAPSULE IN MOUTHPIECE (HANDIHALER) AND INHALE IN MOUTH ONCE A DAY.  30 each  6  . SYMBICORT 160-4.5 MCG/ACT inhaler INHALE 2 PUFFS INTO MOUTH TWICE DAILY  1 Inhaler  6  . [DISCONTINUED] digoxin (LANOXIN) 0.125 MG tablet Take 2 tablets twice daily for 2 days; then reduce to 1/2 tablet daily  90 tablet  3  . warfarin (COUMADIN) 5 MG tablet TAKE AS DIRECTED  45 tablet  6

## 2011-12-10 NOTE — Patient Instructions (Addendum)
No change in medications. Return in   3 months 

## 2011-12-11 NOTE — Assessment & Plan Note (Addendum)
Gold stage D. COPD with primary emphysematous component maintain nocturnal oxygen therapy Stable at this time Plan No change in current medication profile Return 3 months

## 2011-12-23 ENCOUNTER — Encounter: Payer: Self-pay | Admitting: Cardiology

## 2011-12-23 ENCOUNTER — Ambulatory Visit (INDEPENDENT_AMBULATORY_CARE_PROVIDER_SITE_OTHER): Payer: Medicare Other | Admitting: *Deleted

## 2011-12-23 ENCOUNTER — Ambulatory Visit (INDEPENDENT_AMBULATORY_CARE_PROVIDER_SITE_OTHER): Payer: Medicare Other | Admitting: Cardiology

## 2011-12-23 VITALS — BP 124/83 | HR 69 | Ht 69.0 in | Wt 247.8 lb

## 2011-12-23 DIAGNOSIS — I509 Heart failure, unspecified: Secondary | ICD-10-CM

## 2011-12-23 DIAGNOSIS — I4891 Unspecified atrial fibrillation: Secondary | ICD-10-CM

## 2011-12-23 DIAGNOSIS — I10 Essential (primary) hypertension: Secondary | ICD-10-CM

## 2011-12-23 DIAGNOSIS — J438 Other emphysema: Secondary | ICD-10-CM

## 2011-12-23 DIAGNOSIS — Z7901 Long term (current) use of anticoagulants: Secondary | ICD-10-CM

## 2011-12-23 DIAGNOSIS — J439 Emphysema, unspecified: Secondary | ICD-10-CM

## 2011-12-23 DIAGNOSIS — I5032 Chronic diastolic (congestive) heart failure: Secondary | ICD-10-CM

## 2011-12-23 DIAGNOSIS — R609 Edema, unspecified: Secondary | ICD-10-CM

## 2011-12-23 NOTE — Assessment & Plan Note (Signed)
His edema is nicely controlled with his current diuretics and his support hose.

## 2011-12-23 NOTE — Assessment & Plan Note (Signed)
He continues on Coumadin and is stable.

## 2011-12-23 NOTE — Patient Instructions (Addendum)
Your physician recommends that you schedule a follow-up appointment in: 6-8 weeks. Your physician recommends that you continue on your current medications as directed. Please refer to the Current Medication list given to you today.

## 2011-12-23 NOTE — Progress Notes (Signed)
HPI  Patient is seen to followup atrial fibrillation and shortness of breath. He actually looks good today. After I saw him on November 28, 2011, chemistry lab was done. BUN was 15 and creatinine 1.4. This is good for him. Potassium was 3.8. No changes were made with his meds. He saw Dr. Delford Field of pulmonary recently. No changes were made at that time either. He is wearing his support hose. This helps significantly with his edema and significantly with the healing of the lesions on his feet area  Allergies  Allergen Reactions  . Clopidogrel Bisulfate     REACTION: rash    Current Outpatient Prescriptions  Medication Sig Dispense Refill  . albuterol (PROVENTIL) (2.5 MG/3ML) 0.083% nebulizer solution Take 3 mLs (2.5 mg total) by nebulization every 6 (six) hours as needed for wheezing.  75 mL  12  . albuterol (VENTOLIN HFA) 108 (90 BASE) MCG/ACT inhaler Inhale 2 puffs into the lungs every 6 (six) hours as needed for wheezing.  1 Inhaler  6  . aspirin 81 MG tablet Take 81 mg by mouth daily.        . digoxin (LANOXIN) 0.125 MG tablet Take 0.125 mg by mouth daily. Take 1/2 tablet once a day      . furosemide (LASIX) 80 MG tablet 1&1/2 TABLETS IN THE AM AND 1 TABLET IN THE PM      . gabapentin (NEURONTIN) 300 MG capsule Take 4 tablets by mouth two times a day.       . lovastatin (MEVACOR) 20 MG tablet Take 20 mg by mouth 2 (two) times daily.      . metFORMIN (GLUCOPHAGE) 500 MG tablet Take 500 mg by mouth 2 (two) times daily.       . metoprolol (LOPRESSOR) 50 MG tablet Take 25 mg by mouth 2 (two) times daily.      . Multiple Vitamins-Minerals (CENTRUM PO) Take 1 tablet by mouth daily.        Marland Kitchen NITROSTAT 0.4 MG SL tablet Place 0.4 mg under the tongue every 5 (five) minutes as needed.       . Omega-3 Fatty Acids (FISH OIL) 1000 MG CAPS Take 1 capsule by mouth daily.        . potassium chloride SA (K-DUR,KLOR-CON) 20 MEQ tablet Take 2 tablets (40 mEq total) by mouth every morning. & 1 in the evening   90 tablet  3  . predniSONE (DELTASONE) 10 MG tablet Take 10 mg by mouth daily.      Marland Kitchen Respiratory Therapy Supplies (FLUTTER) DEVI Use 4 times daily  1 each  0  . SPIRIVA HANDIHALER 18 MCG inhalation capsule PLACE ONE CAPSULE IN MOUTHPIECE (HANDIHALER) AND INHALE IN MOUTH ONCE A DAY.  30 each  6  . SYMBICORT 160-4.5 MCG/ACT inhaler INHALE 2 PUFFS INTO MOUTH TWICE DAILY  1 Inhaler  6  . warfarin (COUMADIN) 5 MG tablet TAKE AS DIRECTED  45 tablet  6    History   Social History  . Marital Status: Married    Spouse Name: N/A    Number of Children: N/A  . Years of Education: N/A   Occupational History  . Dance movement psychotherapist    Social History Main Topics  . Smoking status: Former Smoker -- 2.0 packs/day for 40 years    Types: Cigarettes    Quit date: 01/06/1998  . Smokeless tobacco: Never Used  . Alcohol Use: No  . Drug Use: No  . Sexually Active: Not on file  Other Topics Concern  . Not on file   Social History Narrative  . No narrative on file    Family History  Problem Relation Age of Onset  . Heart disease Mother   . Cancer Father     throat    Past Medical History  Diagnosis Date  . COPD with emphysema     chronic Dyspnea, followed by Dr. Dennie Bible. [place], oxygen at night started 11/2008  . Atrial fibrillation     Permanent  . Warfarin anticoagulation     Coumadin  . Right ventricular dysfunction     Right Ventricle, mild moderately dilated by Echo 06/2008  . CAD (coronary artery disease)     .MI with a Taxus stent in 2004..  /   nuclear... June, 2010.. small scar or slight ischemia inferior wall  . Diabetes mellitus   . Hyperlipemia   . Obesity   . Peripheral edema     Mild  . Hypertension     relative hypotension... August, 2011... Cozaar stopped  . Foot pain     Significant neuropathy has been evaluated at [facility].  . Ejection fraction     EF 60%, echo, 2010, technically difficult study  . Drug allergy     Plavix causes severe rash  .  Pulmonary hypertension     PA pressure 45-50 mmHg, echo, June, 2013, patient hospitalized with a COPD exacerbation at that time.  . Constipation     July, 2013  . Blisters of multiple sites     Patient has blisters with clear liquid. There are 5 or more on the great toe and second toe of the right foot. There is one on the left great toe.  Marland Kitchen PAD (peripheral artery disease)     Vascular surgery consultation September, 2013  . Chronic diastolic CHF (congestive heart failure)     Patient requires diuretics.    Past Surgical History  Procedure Date  . Rotator cuff repair     x 3  . Knee surgery     Patient Active Problem List  Diagnosis  . Foot pain  . Right ventricular dysfunction  . COPD with emphysema Gold D  . Atrial fibrillation  . Warfarin anticoagulation  . CAD (coronary artery disease)  . Hyperlipemia  . Obesity  . Peripheral edema  . Hypertension  . Ejection fraction  . Pulmonary hypertension  . Drug allergy  . Respiratory failure  . Constipation  . Blisters of multiple sites  . Peripheral arterial occlusive disease  . Venous insufficiency  . Bilateral leg edema  . PAD (peripheral artery disease)  . Chronic diastolic CHF (congestive heart failure)  . Chronic venous insufficiency    ROS   Patient denies fever, chills, headache, sweats, rash, change in vision, change in hearing, chest pain,. He does have a cough. This is old. He's not having any GI or GU symptoms. All other systems are reviewed and are negative.  PHYSICAL EXAM  Patient is oriented to person time and place. Affect is normal. He is overweight but his weight is down 1 pound since his last visit. There is no jugular venous distention. Lung sounds are distant. There is no respiratory distress today. Cardiac exam reveals S1 and S2. His heart sounds also are distant. The abdomen is protuberant but soft. He has his support hose on with no significant edema at this time.  Filed Vitals:   12/23/11 0958    BP: 124/83  Pulse: 69  Height: 5'  9" (1.753 m)  Weight: 247 lb 12.8 oz (112.401 kg)    ASSESSMENT & PLAN

## 2011-12-23 NOTE — Assessment & Plan Note (Signed)
His diastolic CHF is under control. No change in therapy.

## 2011-12-23 NOTE — Assessment & Plan Note (Signed)
He is followed carefully by Dr. Delford Field and he is doing well.

## 2011-12-23 NOTE — Assessment & Plan Note (Signed)
I had added a small dose of digoxin. I believe this did help with his rate control and he is stable.

## 2011-12-23 NOTE — Assessment & Plan Note (Signed)
Blood pressure is controlled. No change in therapy. 

## 2012-01-02 ENCOUNTER — Other Ambulatory Visit: Payer: Self-pay | Admitting: Critical Care Medicine

## 2012-01-19 ENCOUNTER — Other Ambulatory Visit: Payer: Self-pay | Admitting: Cardiology

## 2012-01-19 MED ORDER — FUROSEMIDE 80 MG PO TABS: 80.0000 mg | ORAL_TABLET | Freq: Two times a day (BID) | ORAL | Status: DC

## 2012-01-27 ENCOUNTER — Ambulatory Visit (INDEPENDENT_AMBULATORY_CARE_PROVIDER_SITE_OTHER): Payer: Medicare Other | Admitting: *Deleted

## 2012-01-27 DIAGNOSIS — I4891 Unspecified atrial fibrillation: Secondary | ICD-10-CM

## 2012-01-27 DIAGNOSIS — Z7901 Long term (current) use of anticoagulants: Secondary | ICD-10-CM

## 2012-02-02 ENCOUNTER — Ambulatory Visit: Payer: Medicare Other | Admitting: Cardiology

## 2012-02-06 ENCOUNTER — Telehealth: Payer: Self-pay | Admitting: Critical Care Medicine

## 2012-02-06 NOTE — Telephone Encounter (Signed)
Made an appt w/ PW for 02/25/12 to recertify O2. Nothing further needed at this time.  [name]

## 2012-02-09 ENCOUNTER — Encounter: Payer: Self-pay | Admitting: Cardiology

## 2012-02-09 ENCOUNTER — Ambulatory Visit (INDEPENDENT_AMBULATORY_CARE_PROVIDER_SITE_OTHER): Payer: Medicare Other | Admitting: Cardiology

## 2012-02-09 VITALS — BP 118/76 | HR 95 | Ht 69.0 in | Wt 247.0 lb

## 2012-02-09 DIAGNOSIS — I2510 Atherosclerotic heart disease of native coronary artery without angina pectoris: Secondary | ICD-10-CM

## 2012-02-09 DIAGNOSIS — I5032 Chronic diastolic (congestive) heart failure: Secondary | ICD-10-CM

## 2012-02-09 DIAGNOSIS — J439 Emphysema, unspecified: Secondary | ICD-10-CM

## 2012-02-09 DIAGNOSIS — I4891 Unspecified atrial fibrillation: Secondary | ICD-10-CM

## 2012-02-09 DIAGNOSIS — R609 Edema, unspecified: Secondary | ICD-10-CM

## 2012-02-09 DIAGNOSIS — I509 Heart failure, unspecified: Secondary | ICD-10-CM

## 2012-02-09 DIAGNOSIS — R600 Localized edema: Secondary | ICD-10-CM

## 2012-02-09 DIAGNOSIS — J438 Other emphysema: Secondary | ICD-10-CM

## 2012-02-09 NOTE — Progress Notes (Signed)
HPI  Patient is seen today to followup his atrial fibrillation and shortness of breath. I had added a small dose of digoxin and this seemed to help him significantly. He's doing well. He still has significant shortness of breath but it is stable. I brought him back in this time frame to be sure that he was remaining stable on the newly added digoxin. He's doing relatively well.  Allergies  Allergen Reactions  . Clopidogrel Bisulfate     REACTION: rash    Current Outpatient Prescriptions  Medication Sig Dispense Refill  . albuterol (PROVENTIL) (2.5 MG/3ML) 0.083% nebulizer solution Take 3 mLs (2.5 mg total) by nebulization every 6 (six) hours as needed for wheezing.  75 mL  12  . albuterol (VENTOLIN HFA) 108 (90 BASE) MCG/ACT inhaler Inhale 2 puffs into the lungs every 6 (six) hours as needed for wheezing.  1 Inhaler  6  . aspirin 81 MG tablet Take 81 mg by mouth daily.        . digoxin (LANOXIN) 0.125 MG tablet Take 0.125 mg by mouth daily. Take 1/2 tablet once a day      . furosemide (LASIX) 80 MG tablet Take 1 tablet (80 mg total) by mouth 2 (two) times daily. 1&1/2 TABLETS IN THE AM AND 1 TABLET IN THE PM  75 tablet  2  . gabapentin (NEURONTIN) 300 MG capsule Take 4 tablets by mouth two times a day.       . lovastatin (MEVACOR) 20 MG tablet Take 20 mg by mouth 2 (two) times daily.      . metFORMIN (GLUCOPHAGE) 500 MG tablet Take 500 mg by mouth 2 (two) times daily.       . metoprolol (LOPRESSOR) 50 MG tablet Take 25 mg by mouth 2 (two) times daily.      . Multiple Vitamins-Minerals (CENTRUM PO) Take 1 tablet by mouth daily.        Marland Kitchen NITROSTAT 0.4 MG SL tablet Place 0.4 mg under the tongue every 5 (five) minutes as needed.       . Omega-3 Fatty Acids (FISH OIL) 1000 MG CAPS Take 1 capsule by mouth daily.        . potassium chloride SA (K-DUR,KLOR-CON) 20 MEQ tablet Take 2 tablets (40 mEq total) by mouth every morning. & 1 in the evening  90 tablet  3  . predniSONE (DELTASONE) 10 MG  tablet Take 10 mg by mouth daily.      Marland Kitchen Respiratory Therapy Supplies (FLUTTER) DEVI Use 4 times daily  1 each  0  . SPIRIVA HANDIHALER 18 MCG inhalation capsule PLACE ONE CAPSULE IN MOUTHPIECE (HANDIHALER) AND INHALE IN MOUTH ONCE A DAY.  30 capsule  6  . SYMBICORT 160-4.5 MCG/ACT inhaler INHALE 2 PUFFS INTO MOUTH TWICE DAILY  1 Inhaler  6  . warfarin (COUMADIN) 5 MG tablet TAKE AS DIRECTED  45 tablet  6    History   Social History  . Marital Status: Married    Spouse Name: N/A    Number of Children: N/A  . Years of Education: N/A   Occupational History  . Dance movement psychotherapist    Social History Main Topics  . Smoking status: Former Smoker -- 2.0 packs/day for 40 years    Types: Cigarettes    Quit date: 01/06/1998  . Smokeless tobacco: Never Used  . Alcohol Use: No  . Drug Use: No  . Sexually Active: Not on file   Other Topics Concern  . Not  on file   Social History Narrative  . No narrative on file    Family History  Problem Relation Age of Onset  . Heart disease Mother   . Cancer Father     throat    Past Medical History  Diagnosis Date  . COPD with emphysema     chronic Dyspnea, followed by Dr. Dennie Bible. [place], oxygen at night started 11/2008  . Atrial fibrillation     Permanent  . Warfarin anticoagulation     Coumadin  . Right ventricular dysfunction     Right Ventricle, mild moderately dilated by Echo 06/2008  . CAD (coronary artery disease)     .MI with a Taxus stent in 2004..  /   nuclear... June, 2010.. small scar or slight ischemia inferior wall  . Diabetes mellitus   . Hyperlipemia   . Obesity   . Peripheral edema     Mild  . Hypertension     relative hypotension... August, 2011... Cozaar stopped  . Foot pain     Significant neuropathy has been evaluated at [facility].  . Ejection fraction     EF 60%, echo, 2010, technically difficult study  . Drug allergy     Plavix causes severe rash  . Pulmonary hypertension     PA pressure  45-50 mmHg, echo, June, 2013, patient hospitalized with a COPD exacerbation at that time.  . Constipation     July, 2013  . Blisters of multiple sites     Patient has blisters with clear liquid. There are 5 or more on the great toe and second toe of the right foot. There is one on the left great toe.  Marland Kitchen PAD (peripheral artery disease)     Vascular surgery consultation September, 2013  . Chronic diastolic CHF (congestive heart failure)     Patient requires diuretics.    Past Surgical History  Procedure Date  . Rotator cuff repair     x 3  . Knee surgery     Patient Active Problem List  Diagnosis  . Foot pain  . Right ventricular dysfunction  . COPD with emphysema Gold D  . Atrial fibrillation  . Warfarin anticoagulation  . CAD (coronary artery disease)  . Hyperlipemia  . Obesity  . Peripheral edema  . Hypertension  . Ejection fraction  . Pulmonary hypertension  . Drug allergy  . Respiratory failure  . Constipation  . Blisters of multiple sites  . Peripheral arterial occlusive disease  . Venous insufficiency  . Bilateral leg edema  . PAD (peripheral artery disease)  . Chronic diastolic CHF (congestive heart failure)  . Chronic venous insufficiency    ROS   Patient denies fever, chills, headache, sweats, rash, change in vision, change in hearing, chest pain, cough, nausea vomiting, urinary symptoms. All other systems are reviewed and are negative.  PHYSICAL EXAM   Patient is overweight. He is oriented to person time and place. Affect is normal. He has some end-expiratory sounds for breathing. This is baseline for him. There is no jugulovenous distention. Lungs are L. Scattered rhonchi and decreased breath sounds. Cardiac exam reveals S1 and S2. The rate is controlled. Abdomen is soft. He has no significant peripheral edema.  Filed Vitals:   02/09/12 1343  BP: 118/76  Pulse: 95  Height: 5\' 9"  (1.753 m)  Weight: 247 lb (112.038 kg)  SpO2: 94%     ASSESSMENT &  PLAN

## 2012-02-09 NOTE — Assessment & Plan Note (Signed)
Coronary disease is stable. No change in therapy. 

## 2012-02-09 NOTE — Assessment & Plan Note (Signed)
His edema is greatly improved with the support hose. His skin lesions also have healed.

## 2012-02-09 NOTE — Assessment & Plan Note (Signed)
He has severe lung disease. His home oxygen company wants to be sure that it is re\re documented that his O2 sat drops to 88%. He'll be following up with his lung doctor.

## 2012-02-09 NOTE — Assessment & Plan Note (Signed)
He is stable at this time. No change in therapy.

## 2012-02-09 NOTE — Patient Instructions (Addendum)
Your physician recommends that you schedule a follow-up appointment in: 3 months. Your physician recommends that you continue on your current medications as directed. Please refer to the Current Medication list given to you today. 

## 2012-02-09 NOTE — Assessment & Plan Note (Signed)
Atrial fib is permanent but under reasonable rate control at this time. No change in therapy.

## 2012-02-12 ENCOUNTER — Encounter: Payer: Self-pay | Admitting: Vascular Surgery

## 2012-02-13 ENCOUNTER — Encounter: Payer: Self-pay | Admitting: Vascular Surgery

## 2012-02-13 ENCOUNTER — Encounter (INDEPENDENT_AMBULATORY_CARE_PROVIDER_SITE_OTHER): Payer: Medicare Other | Admitting: *Deleted

## 2012-02-13 ENCOUNTER — Ambulatory Visit (INDEPENDENT_AMBULATORY_CARE_PROVIDER_SITE_OTHER): Payer: Medicare Other | Admitting: Vascular Surgery

## 2012-02-13 VITALS — BP 143/89 | HR 100 | Resp 22 | Ht 69.0 in | Wt 247.0 lb

## 2012-02-13 DIAGNOSIS — I779 Disorder of arteries and arterioles, unspecified: Secondary | ICD-10-CM

## 2012-02-13 DIAGNOSIS — I739 Peripheral vascular disease, unspecified: Secondary | ICD-10-CM

## 2012-02-13 DIAGNOSIS — R600 Localized edema: Secondary | ICD-10-CM

## 2012-02-13 DIAGNOSIS — R609 Edema, unspecified: Secondary | ICD-10-CM

## 2012-02-13 DIAGNOSIS — I743 Embolism and thrombosis of arteries of the lower extremities: Secondary | ICD-10-CM

## 2012-02-13 NOTE — Addendum Note (Signed)
Addended by: Sharee Pimple on: 02/13/2012 04:05 PM   Modules accepted: Orders

## 2012-02-13 NOTE — Progress Notes (Signed)
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 ([birth year]) male who presents with chief complaint: leg swelling.  The patient's symptoms have improved substantially.  Swelling in both legs well controlled with OTC compression stockings.  The patient's symptoms are: B leg swelling (L>>R).  The patient's prior ulcers are completely resolved.  The patient's treatment regimen currently included: maximal medical management and compression stockings.  Pt notes not have intermittent claudication or rest pain as his ambulation in minimal due to COPD.  Past Medical History, Past Surgical History, Social History, Family History, Medications, Allergies, and Review of Systems are unchanged from previous evaluation on 10/10/11.  Physical Examination  Filed Vitals:   02/13/12 0935  BP: 143/89  Pulse: 100  Resp: 22  Height: 5\' 9"  (1.753 m)  Weight: 247 lb (112.038 kg)   Body mass index is 36.48 kg/(m^2).  General: A&O x 3, WD, obese  Pulmonary: Sym exp, good air movt, CTAB, no rales, rhonchi, & wheezing  Cardiac: RRR, Nl S1, S2, no Murmurs, rubs or gallops  Vascular: Vessel Right Left  Radial Palpable Palpable  Ulnar Palpable Palpable  Brachial Palpable Palpable  Carotid Palpable, without bruit Palpable, without bruit  Aorta Not palpable N/A  Femoral Palpable Palpable  Popliteal Not palpable Not palpable  PT Palpable Palpable  DP Faintly Palpable Faintly Palpable   Musculoskeletal: M/S 5/5 throughout , Extremities without ischemic changes , B cyanotic feet, no edema today, no ulcers   Neurologic: Pain and light touch intact in extremities , Motor exam as listed above  Non-Invasive Vascular Imaging ABI (Date: 02/13/12)  RLE: 1.24, PT: triphasic, DP: biphasic  LLE: 1.23, PT: triphasic, DP: biphasic  Medical Decision Making  [name] is a 72 y.o. male who  presents with: minimal BLE PAD, CVI (C5)  Based on the patient's vascular studies and examination, I have offered the patient: q6 month ABI.  The ABI are significantly better today, suggesting some difference between vascular labs.  I would suggest consistently completely the study at the same vascular lab.  If the ABI is stable in 6 month, I would stretch surveillance to annual.  In regards to the CVI, the patient is happy with the result from the OTC compression stockings, so I don't think we need to start 20-30 mm Hg compression at this point.  He is not interested in Vein Clinic referral at this time.  I discussed in depth with the patient the nature of atherosclerosis, and emphasized the importance of maximal medical management including strict control of blood pressure, blood glucose, and lipid levels, antiplatelet agents, obtaining regular exercise, and cessation of smoking.  The patient is aware that without maximal medical management the underlying atherosclerotic disease process will progress, limiting the benefit of any interventions.  Thank you for allowing [place] to participate in this patient's care.  Leonides Sake, MD [facility] [place] Office: [phone] Pager: [phone]  02/13/2012, 10:21 AM

## 2012-02-17 ENCOUNTER — Other Ambulatory Visit: Payer: Self-pay | Admitting: Critical Care Medicine

## 2012-02-25 ENCOUNTER — Ambulatory Visit: Payer: Medicare Other | Admitting: Critical Care Medicine

## 2012-02-26 ENCOUNTER — Ambulatory Visit: Payer: Medicare Other | Admitting: Adult Health

## 2012-02-27 ENCOUNTER — Ambulatory Visit: Payer: Medicare Other | Admitting: Adult Health

## 2012-03-01 ENCOUNTER — Encounter: Payer: Self-pay | Admitting: Adult Health

## 2012-03-01 ENCOUNTER — Ambulatory Visit (INDEPENDENT_AMBULATORY_CARE_PROVIDER_SITE_OTHER): Payer: Medicare Other | Admitting: Adult Health

## 2012-03-01 VITALS — BP 124/84 | HR 93 | Temp 96.7°F | Ht 69.0 in | Wt 247.2 lb

## 2012-03-01 DIAGNOSIS — J439 Emphysema, unspecified: Secondary | ICD-10-CM

## 2012-03-01 DIAGNOSIS — J9690 Respiratory failure, unspecified, unspecified whether with hypoxia or hypercapnia: Secondary | ICD-10-CM

## 2012-03-01 DIAGNOSIS — J9600 Acute respiratory failure, unspecified whether with hypoxia or hypercapnia: Secondary | ICD-10-CM

## 2012-03-01 DIAGNOSIS — J438 Other emphysema: Secondary | ICD-10-CM

## 2012-03-01 NOTE — Patient Instructions (Signed)
Wear oxygen 3 l/m with walking and At bedtime

## 2012-03-01 NOTE — Assessment & Plan Note (Signed)
Chronic resp failure -O2 dependent  desats on RA at 82%, requires 3 l/m for sat >90%.

## 2012-03-01 NOTE — Assessment & Plan Note (Signed)
Compensated on present regimen.   

## 2012-03-01 NOTE — Progress Notes (Signed)
Subjective:    Patient ID: [name], male    DOB: 14-Nov-1940, 72 y.o.   MRN: [id number]  HPI  Mr. Bertz is a 72 y.o.  white male, history of chronic obstructive  lung disease, primary emphysematous component  12/09/11 Since her last visit this patient has been stable with his dyspnea. There is minimal mucus production. There is no chest pain. There is some lower extremity edema. The patient maintains oxygen at night. Patient maintains nebulized therapy during the day.  03/01/2012 follow up /O2 DME qualification  Returns for follow up for COPD  Today O2 at 82% on RA , placed on O2 at 3 lm to obtain sats >90% .  Pt has significant dyspnea with acitivity . Feels better on O2.  No flare in cough or edema.     Past Medical History  Diagnosis Date  . COPD with emphysema     chronic Dyspnea, followed by Dr. Dennie Bible. [place], oxygen at night started 11/2008  . Atrial fibrillation     Permanent  . Warfarin anticoagulation     Coumadin  . Right ventricular dysfunction     Right Ventricle, mild moderately dilated by Echo 06/2008  . CAD (coronary artery disease)     .MI with a Taxus stent in 2004..  /   nuclear... June, 2010.. small scar or slight ischemia inferior wall  . Diabetes mellitus   . Hyperlipemia   . Obesity   . Peripheral edema     Mild  . Hypertension     relative hypotension... August, 2011... Cozaar stopped  . Foot pain     Significant neuropathy has been [facility] at [facility].  . Ejection fraction     EF 60%, echo, 2010, technically difficult study  . Drug allergy     Plavix causes severe rash  . Pulmonary hypertension     PA pressure 45-50 mmHg, echo, June, 2013, patient hospitalized with a COPD exacerbation at that time.  . Constipation     July, 2013  . Blisters of multiple sites     Patient has blisters with clear liquid. There are 5 or more on the great toe and second toe of the right foot. There is one on the left great toe.  Marland Kitchen PAD  (peripheral artery disease)     Vascular surgery consultation September, 2013  . Chronic diastolic CHF (congestive heart failure)     Patient requires diuretics.     Family History  Problem Relation Age of Onset  . Heart disease Mother   . Cancer Father     throat     History   Social History  . Marital Status: Married    Spouse Name: N/A    Number of Children: N/A  . Years of Education: N/A   Occupational History  . Dance movement psychotherapist    Social History Main Topics  . Smoking status: Former Smoker -- 2.00 packs/day for 40 years    Types: Cigarettes    Quit date: 01/06/1998  . Smokeless tobacco: Never Used  . Alcohol Use: No  . Drug Use: No  . Sexually Active: Not on file   Other Topics Concern  . Not on file   Social History Narrative  . No narrative on file     Allergies  Allergen Reactions  . Clopidogrel Bisulfate     REACTION: rash     Outpatient Prescriptions Prior to Visit  Medication Sig Dispense Refill  . albuterol (PROVENTIL) (2.5 MG/3ML) 0.083%  nebulizer solution Take 3 mLs (2.5 mg total) by nebulization every 6 (six) hours as needed for wheezing.  75 mL  12  . albuterol (VENTOLIN HFA) 108 (90 BASE) MCG/ACT inhaler Inhale 2 puffs into the lungs every 6 (six) hours as needed for wheezing.  1 Inhaler  6  . aspirin 81 MG tablet Take 81 mg by mouth daily.        . digoxin (LANOXIN) 0.125 MG tablet Take by mouth daily. Take 1/2 tablet once a day      . gabapentin (NEURONTIN) 300 MG capsule Take 4 tablets by mouth two times a day.       . lovastatin (MEVACOR) 20 MG tablet Take 20 mg by mouth 2 (two) times daily.      . metFORMIN (GLUCOPHAGE) 500 MG tablet Take 500 mg by mouth 2 (two) times daily.       . metoprolol (LOPRESSOR) 50 MG tablet Take 25 mg by mouth 2 (two) times daily.      . Multiple Vitamins-Minerals (CENTRUM PO) Take 1 tablet by mouth daily.        Marland Kitchen NITROSTAT 0.4 MG SL tablet Place 0.4 mg under the tongue every 5 (five) minutes as needed.        . Omega-3 Fatty Acids (FISH OIL) 1000 MG CAPS Take 1 capsule by mouth daily.        . potassium chloride SA (K-DUR,KLOR-CON) 20 MEQ tablet Take 2 tablets (40 mEq total) by mouth every morning. & 1 in the evening  90 tablet  3  . predniSONE (DELTASONE) 10 MG tablet Take 10 mg by mouth daily.      Marland Kitchen Respiratory Therapy Supplies (FLUTTER) DEVI Use 4 times daily  1 each  0  . SPIRIVA HANDIHALER 18 MCG inhalation capsule PLACE ONE CAPSULE IN MOUTHPIECE (HANDIHALER) AND INHALE IN MOUTH ONCE A DAY.  30 capsule  6  . SYMBICORT 160-4.5 MCG/ACT inhaler INHALE 2 PUFFS INTO MOUTH TWICE DAILY  10.2 g  6  . warfarin (COUMADIN) 5 MG tablet TAKE AS DIRECTED  45 tablet  6  . furosemide (LASIX) 80 MG tablet Take 1 tablet (80 mg total) by mouth 2 (two) times daily. 1&1/2 TABLETS IN THE AM AND 1 TABLET IN THE PM  75 tablet  2  . amoxicillin (AMOXIL) 500 MG capsule        No facility-administered medications prior to visit.      Review of Systems  Constitutional:   No  weight loss, night sweats,  Fevers, chills, + fatigue, lassitude. HEENT:   No headaches,  Difficulty swallowing,  Tooth/dental problems,  Sore throat,                No sneezing, itching, ear ache, nasal congestion, post nasal drip,   CV:  No chest pain,  Orthopnea, PND, swelling in lower extremities, anasarca, dizziness, palpitations  GI  No heartburn, indigestion, abdominal pain, nausea, vomiting, diarrhea, change in bowel habits, loss of appetite  Resp:  No coughing up of blood.  No change in color of mucus.  No wheezing.  No chest wall d eformity  Skin: no rash or lesions.  GU: no dysuria, change in color of urine, no urgency or frequency.  No flank pain.  MS:  No joint pain or swelling.  No decreased range of motion.  No back pain.  Psych:  No change in mood or affect. No depression or anxiety.  No memory loss.     Objective:  Physical Exam  Filed Vitals:   03/01/12 1408  BP: 124/84  Pulse: 93  Temp: 96.7 F (35.9 C)   TempSrc: Oral  Height: 5\' 9"  (1.753 m)  Weight: 247 lb 3.2 oz (112.129 kg)  SpO2: 95%    Gen: Pleasant, elderly  in no distress,  normal affect  ENT: No lesions,  mouth clear,  oropharynx clear, no postnasal drip  Neck: No JVD, no TMG, no carotid bruits  Lungs: No use of accessory muscles, no dullness to percussion, distant BS,   Cardiovascular: RRR, heart sounds normal, no murmur or gallops, tr  peripheral edema  Abdomen: soft and NT, no HSM,  BS normal  Musculoskeletal: No deformities, no cyanosis or clubbing  Neuro: alert, non focal  Skin: Warm, no lesions or rashes        Assessment & Plan:   No problem-specific assessment & plan notes found for this encounter.   Updated Medication List Outpatient Encounter Prescriptions as of 03/01/2012  Medication Sig Dispense Refill  . albuterol (PROVENTIL) (2.5 MG/3ML) 0.083% nebulizer solution Take 3 mLs (2.5 mg total) by nebulization every 6 (six) hours as needed for wheezing.  75 mL  12  . albuterol (VENTOLIN HFA) 108 (90 BASE) MCG/ACT inhaler Inhale 2 puffs into the lungs every 6 (six) hours as needed for wheezing.  1 Inhaler  6  . aspirin 81 MG tablet Take 81 mg by mouth daily.        . digoxin (LANOXIN) 0.125 MG tablet Take by mouth daily. Take 1/2 tablet once a day      . furosemide (LASIX) 80 MG tablet Take by mouth 2 (two) times daily. 1&1/2 TABLETS IN THE AM AND 1 TABLET IN THE PM      . gabapentin (NEURONTIN) 300 MG capsule Take 4 tablets by mouth two times a day.       . lovastatin (MEVACOR) 20 MG tablet Take 20 mg by mouth 2 (two) times daily.      . metFORMIN (GLUCOPHAGE) 500 MG tablet Take 500 mg by mouth 2 (two) times daily.       . metoprolol (LOPRESSOR) 50 MG tablet Take 25 mg by mouth 2 (two) times daily.      . Multiple Vitamins-Minerals (CENTRUM PO) Take 1 tablet by mouth daily.        Marland Kitchen NITROSTAT 0.4 MG SL tablet Place 0.4 mg under the tongue every 5 (five) minutes as needed.       . Omega-3 Fatty Acids  (FISH OIL) 1000 MG CAPS Take 1 capsule by mouth daily.        . potassium chloride SA (K-DUR,KLOR-CON) 20 MEQ tablet Take 2 tablets (40 mEq total) by mouth every morning. & 1 in the evening  90 tablet  3  . predniSONE (DELTASONE) 10 MG tablet Take 10 mg by mouth daily.      Marland Kitchen Respiratory Therapy Supplies (FLUTTER) DEVI Use 4 times daily  1 each  0  . SPIRIVA HANDIHALER 18 MCG inhalation capsule PLACE ONE CAPSULE IN MOUTHPIECE (HANDIHALER) AND INHALE IN MOUTH ONCE A DAY.  30 capsule  6  . SYMBICORT 160-4.5 MCG/ACT inhaler INHALE 2 PUFFS INTO MOUTH TWICE DAILY  10.2 g  6  . warfarin (COUMADIN) 5 MG tablet TAKE AS DIRECTED  45 tablet  6  . [DISCONTINUED] furosemide (LASIX) 80 MG tablet Take 1 tablet (80 mg total) by mouth 2 (two) times daily. 1&1/2 TABLETS IN THE AM AND 1 TABLET IN THE PM  75 tablet  2  . [DISCONTINUED] amoxicillin (AMOXIL) 500 MG capsule        No facility-administered encounter medications on file as of 03/01/2012.

## 2012-03-04 ENCOUNTER — Ambulatory Visit (INDEPENDENT_AMBULATORY_CARE_PROVIDER_SITE_OTHER): Payer: Medicare Other | Admitting: Neurosurgery

## 2012-03-04 ENCOUNTER — Encounter (INDEPENDENT_AMBULATORY_CARE_PROVIDER_SITE_OTHER): Payer: Medicare Other | Admitting: *Deleted

## 2012-03-04 ENCOUNTER — Telehealth: Payer: Self-pay | Admitting: *Deleted

## 2012-03-04 ENCOUNTER — Encounter: Payer: Self-pay | Admitting: Neurosurgery

## 2012-03-04 VITALS — BP 125/87 | HR 100 | Resp 28 | Ht 69.0 in | Wt 243.0 lb

## 2012-03-04 DIAGNOSIS — I739 Peripheral vascular disease, unspecified: Secondary | ICD-10-CM

## 2012-03-04 DIAGNOSIS — M25579 Pain in unspecified ankle and joints of unspecified foot: Secondary | ICD-10-CM

## 2012-03-04 DIAGNOSIS — M25572 Pain in left ankle and joints of left foot: Secondary | ICD-10-CM

## 2012-03-04 NOTE — Telephone Encounter (Signed)
Patient called C/O pain, swelling and redness of left foot and toes. Symptoms have worsened. Scheduled to see Peter Chandler NP today Feb 27 @ 1:00.

## 2012-03-04 NOTE — Progress Notes (Signed)
[facility] [place] PAD/PVD Office Note  CC: Left foot and ankle edema Referring Physician: Imogene Burn  History of Present Illness: 72 year old male patient of Dr. Imogene Burn who call the office due to some left lower extremity edema that's be seen. The patient states she's had some edema in this foot and ankle for about 2 weeks and begin have some pain last night and wanted to be evaluated. Dr. Imogene Burn as noted in the past he does have deep system return issues and has the patient on compression and elevation. The patient's previous ABIs were completely normal.  Past Medical History  Diagnosis Date  . COPD with emphysema     chronic Dyspnea, followed by Dr. Dennie Bible. [place], oxygen at night started 11/2008  . Atrial fibrillation     Permanent  . Warfarin anticoagulation     Coumadin  . Right ventricular dysfunction     Right Ventricle, mild moderately dilated by Echo 06/2008  . CAD (coronary artery disease)     .MI with a Taxus stent in 2004..  /   nuclear... June, 2010.. small scar or slight ischemia inferior wall  . Diabetes mellitus   . Hyperlipemia   . Obesity   . Peripheral edema     Mild  . Hypertension     relative hypotension... August, 2011... Cozaar stopped  . Foot pain     Significant neuropathy has been evaluated at [facility].  . Ejection fraction     EF 60%, echo, 2010, technically difficult study  . Drug allergy     Plavix causes severe rash  . Pulmonary hypertension     PA pressure 45-50 mmHg, echo, June, 2013, patient hospitalized with a COPD exacerbation at that time.  . Constipation     July, 2013  . Blisters of multiple sites     Patient has blisters with clear liquid. There are 5 or more on the great toe and second toe of the right foot. There is one on the left great toe.  Marland Kitchen PAD (peripheral artery disease)     Vascular surgery consultation September, 2013  . Chronic diastolic CHF (congestive heart failure)     Patient requires  diuretics.    ROS: [x]  Positive   [ ]  Denies    General: [ ]  Weight loss, [ ]  Fever, [ ]  chills Neurologic: [ ]  Dizziness, [ ]  Blackouts, [ ]  Seizure [ ]  Stroke, [ ]  "Mini stroke", [ ]  Slurred speech, [ ]  Temporary blindness; [ ]  weakness in arms or legs, [ ]  Hoarseness Cardiac: [ ]  Chest pain/pressure, [ ]  Shortness of breath at rest [ ]  Shortness of breath with exertion, [ ]  Atrial fibrillation or irregular heartbeat Vascular: [ ]  Pain in legs with walking, [ ]  Pain in legs at rest, [ ]  Pain in legs at night,  [ ]  Non-healing ulcer, [ ]  Blood clot in vein/DVT,   Pulmonary: [ ]  Home oxygen, [ ]  Productive cough, [ ]  Coughing up blood, [ ]  Asthma,  [ ]  Wheezing Musculoskeletal:  [ ]  Arthritis, [ ]  Low back pain, [ ]  Joint pain Hematologic: [ ]  Easy Bruising, [ ]  Anemia; [ ]  Hepatitis Gastrointestinal: [ ]  Blood in stool, [ ]  Gastroesophageal Reflux/heartburn, [ ]  Trouble swallowing Urinary: [ ]  chronic Kidney disease, [ ]  on HD - [ ]  MWF or [ ]  TTHS, [ ]  Burning with urination, [ ]  Difficulty urinating Skin: [ ]  Rashes, [ ]  Wounds Psychological: [ ]  Anxiety, [ ]   Depression   Social History History  Substance Use Topics  . Smoking status: Former Smoker -- 2.00 packs/day for 40 years    Types: Cigarettes    Quit date: 01/06/1998  . Smokeless tobacco: Never Used  . Alcohol Use: No    Family History Family History  Problem Relation Age of Onset  . Heart disease Mother   . Cancer Father     throat    Allergies  Allergen Reactions  . Clopidogrel Bisulfate     REACTION: rash    Current Outpatient Prescriptions  Medication Sig Dispense Refill  . albuterol (PROVENTIL) (2.5 MG/3ML) 0.083% nebulizer solution Take 3 mLs (2.5 mg total) by nebulization every 6 (six) hours as needed for wheezing.  75 mL  12  . albuterol (VENTOLIN HFA) 108 (90 BASE) MCG/ACT inhaler Inhale 2 puffs into the lungs every 6 (six) hours as needed for wheezing.  1 Inhaler  6  . aspirin 81 MG tablet Take 81  mg by mouth daily.        . digoxin (LANOXIN) 0.125 MG tablet Take by mouth daily. Take 1/2 tablet once a day      . furosemide (LASIX) 80 MG tablet Take by mouth 2 (two) times daily. 1&1/2 TABLETS IN THE AM AND 1 TABLET IN THE PM      . gabapentin (NEURONTIN) 300 MG capsule Take 4 tablets by mouth two times a day.       . lovastatin (MEVACOR) 20 MG tablet Take 20 mg by mouth 2 (two) times daily.      . metFORMIN (GLUCOPHAGE) 500 MG tablet Take 500 mg by mouth 2 (two) times daily.       . metoprolol (LOPRESSOR) 50 MG tablet Take 25 mg by mouth 2 (two) times daily.      . Multiple Vitamins-Minerals (CENTRUM PO) Take 1 tablet by mouth daily.        Marland Kitchen NITROSTAT 0.4 MG SL tablet Place 0.4 mg under the tongue every 5 (five) minutes as needed.       . Omega-3 Fatty Acids (FISH OIL) 1000 MG CAPS Take 1 capsule by mouth daily.        . potassium chloride SA (K-DUR,KLOR-CON) 20 MEQ tablet Take 2 tablets (40 mEq total) by mouth every morning. & 1 in the evening  90 tablet  3  . predniSONE (DELTASONE) 10 MG tablet Take 10 mg by mouth daily.      Marland Kitchen Respiratory Therapy Supplies (FLUTTER) DEVI Use 4 times daily  1 each  0  . SPIRIVA HANDIHALER 18 MCG inhalation capsule PLACE ONE CAPSULE IN MOUTHPIECE (HANDIHALER) AND INHALE IN MOUTH ONCE A DAY.  30 capsule  6  . SYMBICORT 160-4.5 MCG/ACT inhaler INHALE 2 PUFFS INTO MOUTH TWICE DAILY  10.2 g  6  . warfarin (COUMADIN) 5 MG tablet TAKE AS DIRECTED  45 tablet  6   No current facility-administered medications for this visit.    Physical Examination  Filed Vitals:   03/04/12 1305  BP: 125/87  Pulse: 100  Resp: 28    Body mass index is 35.87 kg/(m^2).  General:  WDWN in NAD Gait: Normal HEENT: WNL Eyes: Pupils equal Pulmonary: normal non-labored breathing , without Rales, rhonchi,  wheezing Cardiac: RRR, without  Murmurs, rubs or gallops; No carotid bruits Abdomen: soft, NT, no masses Skin: no rashes, ulcers noted Vascular Exam/Pulses: Palpable  femoral pulses bilaterally, left lower extremity PT and DP are easily heard with Doppler  Extremities without ischemic  changes, no Gangrene , no cellulitis; no open wounds;  Musculoskeletal: no muscle wasting or atrophy  Neurologic: A&O X 3; Appropriate Affect ; SENSATION: normal; MOTOR FUNCTION:  moving all extremities equally. Speech is fluent/normal  Non-Invasive Vascular Imaging: Dr. Darrick Penna ordered a lower extremity venous duplex evaluation which shows the venous system to be patent and compressible. He does have a competent saphenous vein on the left  ASSESSMENT/PLAN: This is a patient with left lower extremity edema due to poor venous return, Dr. Darrick Penna explained that the patient does not have a blood clot and asked to continue his compression and elevation. The patient and his wife state understanding and will followup with Dr. Imogene Burn later this year as scheduled. Their questions were encouraged and answered, they're in agreement with this plan.  Lauree Chandler ANP  Clinic M.D.: [place]

## 2012-03-09 ENCOUNTER — Ambulatory Visit (INDEPENDENT_AMBULATORY_CARE_PROVIDER_SITE_OTHER): Payer: Medicare Other | Admitting: *Deleted

## 2012-03-09 DIAGNOSIS — Z7901 Long term (current) use of anticoagulants: Secondary | ICD-10-CM

## 2012-03-09 DIAGNOSIS — I4891 Unspecified atrial fibrillation: Secondary | ICD-10-CM

## 2012-03-22 ENCOUNTER — Other Ambulatory Visit: Payer: Self-pay | Admitting: Cardiology

## 2012-03-22 MED ORDER — POTASSIUM CHLORIDE CRYS ER 20 MEQ PO TBCR: 40.0000 meq | EXTENDED_RELEASE_TABLET | Freq: Every morning | ORAL | Status: DC

## 2012-03-22 NOTE — Telephone Encounter (Signed)
error 

## 2012-04-14 ENCOUNTER — Other Ambulatory Visit: Payer: Self-pay | Admitting: Cardiology

## 2012-04-14 MED ORDER — FUROSEMIDE 80 MG PO TABS: ORAL_TABLET | ORAL | Status: DC

## 2012-04-20 ENCOUNTER — Ambulatory Visit (INDEPENDENT_AMBULATORY_CARE_PROVIDER_SITE_OTHER): Payer: Medicare Other | Admitting: *Deleted

## 2012-04-20 DIAGNOSIS — I4891 Unspecified atrial fibrillation: Secondary | ICD-10-CM

## 2012-04-20 DIAGNOSIS — Z7901 Long term (current) use of anticoagulants: Secondary | ICD-10-CM

## 2012-05-04 ENCOUNTER — Ambulatory Visit (INDEPENDENT_AMBULATORY_CARE_PROVIDER_SITE_OTHER): Payer: Medicare Other | Admitting: Critical Care Medicine

## 2012-05-04 ENCOUNTER — Encounter: Payer: Self-pay | Admitting: Critical Care Medicine

## 2012-05-04 VITALS — BP 108/68 | HR 89 | Temp 97.3°F | Ht 69.0 in | Wt 245.6 lb

## 2012-05-04 DIAGNOSIS — J439 Emphysema, unspecified: Secondary | ICD-10-CM

## 2012-05-04 DIAGNOSIS — Z9181 History of falling: Secondary | ICD-10-CM | POA: Insufficient documentation

## 2012-05-04 DIAGNOSIS — J9610 Chronic respiratory failure, unspecified whether with hypoxia or hypercapnia: Secondary | ICD-10-CM

## 2012-05-04 DIAGNOSIS — J438 Other emphysema: Secondary | ICD-10-CM

## 2012-05-04 NOTE — Assessment & Plan Note (Signed)
Gold stage D. COPD with chronic dyspnea and nocturnal oxygen therapy stable at this time Plan Maintain inhaled medications as prescribed Maintain nocturnal oxygen therapy as prescribed

## 2012-05-04 NOTE — Progress Notes (Signed)
Subjective:    Patient ID: [name], male    DOB: Jan 23, 1940, 72 y.o.   MRN: [id number]  HPI  [name] is a 72 y.o.  white male, history of chronic obstructive  lung disease, primary emphysematous component  05/04/2012 Since the last visit this patient has stabilized. There is less cough and congestion. There is less shortness of breath. There is less wheezing. The patient denies any active respiratory complaints. The patient does have dyspnea on exertion. The patient continues to use oxygen at night. Pt denies any significant sore throat, nasal congestion or excess secretions, fever, chills, sweats, unintended weight loss, pleurtic or exertional chest pain, orthopnea PND, or leg swelling Pt denies any increase in rescue therapy over baseline, denies waking up needing it or having any early am or nocturnal exacerbations of coughing/wheezing/or dyspnea. Pt also denies any obvious fluctuation in symptoms with  weather or environmental change or other alleviating or aggravating factors      Past Medical History  Diagnosis Date  . COPD with emphysema     chronic Dyspnea, followed by Dr. Dennie Bible. Wright, oxygen at night started 11/2008  . Atrial fibrillation     Permanent  . Warfarin anticoagulation     Coumadin  . Right ventricular dysfunction     Right Ventricle, mild moderately dilated by Echo 06/2008  . CAD (coronary artery disease)     .MI with a Taxus stent in 2004..  /   nuclear... June, 2010.. small scar or slight ischemia inferior wall  . Diabetes mellitus   . Hyperlipemia   . Obesity   . Peripheral edema     Mild  . Hypertension     relative hypotension... August, 2011... Cozaar stopped  . Foot pain     Significant neuropathy has been evaluated at [facility].  . Ejection fraction     EF 60%, echo, 2010, technically difficult study  . Drug allergy     Plavix causes severe rash  . Pulmonary hypertension     PA pressure 45-50 mmHg, echo, June,  2013, patient hospitalized with a COPD exacerbation at that time.  . Constipation     July, 2013  . Blisters of multiple sites     Patient has blisters with clear liquid. There are 5 or more on the great toe and second toe of the right foot. There is one on the left great toe.  Marland Kitchen PAD (peripheral artery disease)     Vascular surgery consultation September, 2013  . Chronic diastolic CHF (congestive heart failure)     Patient requires diuretics.     Family History  Problem Relation Age of Onset  . Heart disease Mother   . Cancer Father     throat     History   Social History  . Marital Status: Married    Spouse Name: N/A    Number of Children: N/A  . Years of Education: N/A   Occupational History  . Dance movement psychotherapist    Social History Main Topics  . Smoking status: Former Smoker -- 2.00 packs/day for 40 years    Types: Cigarettes    Quit date: 01/06/1998  . Smokeless tobacco: Never Used  . Alcohol Use: No  . Drug Use: No  . Sexually Active: Not on file   Other Topics Concern  . Not on file   Social History Narrative  . No narrative on file     Allergies  Allergen Reactions  . Clopidogrel Bisulfate  REACTION: rash     Outpatient Prescriptions Prior to Visit  Medication Sig Dispense Refill  . albuterol (PROVENTIL) (2.5 MG/3ML) 0.083% nebulizer solution Take 3 mLs (2.5 mg total) by nebulization every 6 (six) hours as needed for wheezing.  75 mL  12  . albuterol (VENTOLIN HFA) 108 (90 BASE) MCG/ACT inhaler Inhale 2 puffs into the lungs every 6 (six) hours as needed for wheezing.  1 Inhaler  6  . aspirin 81 MG tablet Take 81 mg by mouth daily.        . digoxin (LANOXIN) 0.125 MG tablet Take by mouth daily. Take 1/2 tablet once a day      . furosemide (LASIX) 80 MG tablet 1&1/2 TABLETS IN THE AM AND 1 TABLET IN THE PM  75 tablet  6  . gabapentin (NEURONTIN) 300 MG capsule Take 4 tablets by mouth two times a day.       . lovastatin (MEVACOR) 20 MG tablet Take 20 mg by  mouth 2 (two) times daily.      . metFORMIN (GLUCOPHAGE) 500 MG tablet Take 500 mg by mouth 2 (two) times daily.       . metoprolol (LOPRESSOR) 50 MG tablet Take 25 mg by mouth 2 (two) times daily.      . Multiple Vitamins-Minerals (CENTRUM PO) Take 1 tablet by mouth daily.        Marland Kitchen NITROSTAT 0.4 MG SL tablet Place 0.4 mg under the tongue every 5 (five) minutes as needed.       . Omega-3 Fatty Acids (FISH OIL) 1000 MG CAPS Take 1 capsule by mouth daily.        . potassium chloride SA (K-DUR,KLOR-CON) 20 MEQ tablet Take 2 tablets (40 mEq total) by mouth every morning. & 1 in the evening  90 tablet  3  . predniSONE (DELTASONE) 10 MG tablet Take 10 mg by mouth daily.      Marland Kitchen Respiratory Therapy Supplies (FLUTTER) DEVI Use 4 times daily  1 each  0  . SPIRIVA HANDIHALER 18 MCG inhalation capsule PLACE ONE CAPSULE IN MOUTHPIECE (HANDIHALER) AND INHALE IN MOUTH ONCE A DAY.  30 capsule  6  . SYMBICORT 160-4.5 MCG/ACT inhaler INHALE 2 PUFFS INTO MOUTH TWICE DAILY  10.2 g  6  . warfarin (COUMADIN) 5 MG tablet TAKE AS DIRECTED  45 tablet  6   No facility-administered medications prior to visit.      Review of Systems  Constitutional:   No  weight loss, night sweats,  Fevers, chills, + fatigue, lassitude. HEENT:   No headaches,  Difficulty swallowing,  Tooth/dental problems,  Sore throat,                No sneezing, itching, ear ache, nasal congestion, post nasal drip,   CV:  No chest pain,  Orthopnea, PND, swelling in lower extremities, anasarca, dizziness, palpitations  GI  No heartburn, indigestion, abdominal pain, nausea, vomiting, diarrhea, change in bowel habits, loss of appetite  Resp:  No coughing up of blood.  No change in color of mucus.  No wheezing.  No chest wall d eformity  Skin: no rash or lesions.  GU: no dysuria, change in color of urine, no urgency or frequency.  No flank pain.  MS:  No joint pain or swelling.  No decreased range of motion.  No back pain.  Psych:  No change  in mood or affect. No depression or anxiety.  No memory loss.     Objective:  Physical Exam  Filed Vitals:   05/04/12 0909  BP: 108/68  Pulse: 89  Temp: 97.3 F (36.3 C)  TempSrc: Oral  Height: 5\' 9"  (1.753 m)  Weight: 245 lb 9.6 oz (111.403 kg)  SpO2: 94%    Gen: Pleasant, elderly  in no distress,  normal affect  ENT: No lesions,  mouth clear,  oropharynx clear, no postnasal drip  Neck: No JVD, no TMG, no carotid bruits  Lungs: No use of accessory muscles, no dullness to percussion, distant BS,   Cardiovascular: RRR, heart sounds normal, no murmur or gallops, tr  peripheral edema  Abdomen: soft and NT, no HSM,  BS normal  Musculoskeletal: No deformities, no cyanosis or clubbing  Neuro: alert, non focal  Skin: Warm, no lesions or rashes        Assessment & Plan:   Chronic respiratory failure Chronic respiratory failure the basis of COPD gold stage D. Patient currently only needs oxygen at night and ASD with extreme exertion during the day  COPD with emphysema Gold D Gold stage D. COPD with chronic dyspnea and nocturnal oxygen therapy stable at this time Plan Maintain inhaled medications as prescribed Maintain nocturnal oxygen therapy as prescribed    Updated Medication List Outpatient Encounter Prescriptions as of 05/04/2012  Medication Sig Dispense Refill  . albuterol (PROVENTIL) (2.5 MG/3ML) 0.083% nebulizer solution Take 3 mLs (2.5 mg total) by nebulization every 6 (six) hours as needed for wheezing.  75 mL  12  . albuterol (VENTOLIN HFA) 108 (90 BASE) MCG/ACT inhaler Inhale 2 puffs into the lungs every 6 (six) hours as needed for wheezing.  1 Inhaler  6  . aspirin 81 MG tablet Take 81 mg by mouth daily.        . digoxin (LANOXIN) 0.125 MG tablet Take by mouth daily. Take 1/2 tablet once a day      . furosemide (LASIX) 80 MG tablet 1&1/2 TABLETS IN THE AM AND 1 TABLET IN THE PM  75 tablet  6  . gabapentin (NEURONTIN) 300 MG capsule Take 4 tablets by  mouth two times a day.       . lovastatin (MEVACOR) 20 MG tablet Take 20 mg by mouth 2 (two) times daily.      . metFORMIN (GLUCOPHAGE) 500 MG tablet Take 500 mg by mouth 2 (two) times daily.       . metoprolol (LOPRESSOR) 50 MG tablet Take 25 mg by mouth 2 (two) times daily.      . Multiple Vitamins-Minerals (CENTRUM PO) Take 1 tablet by mouth daily.        Marland Kitchen NITROSTAT 0.4 MG SL tablet Place 0.4 mg under the tongue every 5 (five) minutes as needed.       . Omega-3 Fatty Acids (FISH OIL) 1000 MG CAPS Take 1 capsule by mouth daily.        . potassium chloride SA (K-DUR,KLOR-CON) 20 MEQ tablet Take 2 tablets (40 mEq total) by mouth every morning. & 1 in the evening  90 tablet  3  . predniSONE (DELTASONE) 10 MG tablet Take 10 mg by mouth daily.      Marland Kitchen Respiratory Therapy Supplies (FLUTTER) DEVI Use 4 times daily  1 each  0  . SPIRIVA HANDIHALER 18 MCG inhalation capsule PLACE ONE CAPSULE IN MOUTHPIECE (HANDIHALER) AND INHALE IN MOUTH ONCE A DAY.  30 capsule  6  . SYMBICORT 160-4.5 MCG/ACT inhaler INHALE 2 PUFFS INTO MOUTH TWICE DAILY  10.2 g  6  .  warfarin (COUMADIN) 5 MG tablet TAKE AS DIRECTED  45 tablet  6   No facility-administered encounter medications on file as of 05/04/2012.

## 2012-05-04 NOTE — Patient Instructions (Addendum)
No change in medications. Return in   3 months 

## 2012-05-04 NOTE — Assessment & Plan Note (Signed)
Chronic respiratory failure the basis of COPD gold stage D. Patient currently only needs oxygen at night and ASD with extreme exertion during the day

## 2012-05-10 ENCOUNTER — Encounter: Payer: Self-pay | Admitting: Cardiology

## 2012-05-10 ENCOUNTER — Ambulatory Visit (INDEPENDENT_AMBULATORY_CARE_PROVIDER_SITE_OTHER): Payer: Medicare Other | Admitting: Cardiology

## 2012-05-10 VITALS — BP 121/81 | HR 75 | Ht 69.0 in | Wt 242.8 lb

## 2012-05-10 DIAGNOSIS — I5032 Chronic diastolic (congestive) heart failure: Secondary | ICD-10-CM

## 2012-05-10 DIAGNOSIS — I509 Heart failure, unspecified: Secondary | ICD-10-CM

## 2012-05-10 DIAGNOSIS — I4891 Unspecified atrial fibrillation: Secondary | ICD-10-CM

## 2012-05-10 NOTE — Assessment & Plan Note (Signed)
Patient has significant neuropathy. He needs followup in this area to help him with this.

## 2012-05-10 NOTE — Progress Notes (Signed)
HPI    Patient is seen today in followup of atrial fibrillation and his shortness of breath. He has significant neuropathy in his legs and also has significant venous disease. He's followed by the vascular surgeons. He also has significant pulmonary disease and is being followed carefully by Dr. Delford Field. The patient has had some increased ankle edema. He is careful with his salt intake but drinks too much fluid. He has chronic limiting shortness of breath.  Allergies  Allergen Reactions  . Clopidogrel Bisulfate     REACTION: rash    Current Outpatient Prescriptions  Medication Sig Dispense Refill  . albuterol (PROVENTIL) (2.5 MG/3ML) 0.083% nebulizer solution Take 3 mLs (2.5 mg total) by nebulization every 6 (six) hours as needed for wheezing.  75 mL  12  . albuterol (VENTOLIN HFA) 108 (90 BASE) MCG/ACT inhaler Inhale 2 puffs into the lungs every 6 (six) hours as needed for wheezing.  1 Inhaler  6  . aspirin 81 MG tablet Take 81 mg by mouth daily.        . digoxin (LANOXIN) 0.125 MG tablet Take by mouth daily. Take 1/2 tablet once a day      . furosemide (LASIX) 80 MG tablet 1&1/2 TABLETS IN THE AM AND 1 TABLET IN THE PM  75 tablet  6  . gabapentin (NEURONTIN) 300 MG capsule Take 4 tablets by mouth two times a day.       . lovastatin (MEVACOR) 20 MG tablet Take 20 mg by mouth 2 (two) times daily.      . metFORMIN (GLUCOPHAGE) 500 MG tablet Take 500 mg by mouth 2 (two) times daily.       . metoprolol (LOPRESSOR) 50 MG tablet Take 25 mg by mouth 2 (two) times daily.      . Multiple Vitamins-Minerals (CENTRUM PO) Take 1 tablet by mouth daily.        Marland Kitchen NITROSTAT 0.4 MG SL tablet Place 0.4 mg under the tongue every 5 (five) minutes as needed.       . Omega-3 Fatty Acids (FISH OIL) 1000 MG CAPS Take 1 capsule by mouth daily.        . potassium chloride SA (K-DUR,KLOR-CON) 20 MEQ tablet Take 2 tablets (40 mEq total) by mouth every morning. & 1 in the evening  90 tablet  3  . predniSONE  (DELTASONE) 10 MG tablet Take 10 mg by mouth daily.      Marland Kitchen Respiratory Therapy Supplies (FLUTTER) DEVI Use 4 times daily  1 each  0  . SPIRIVA HANDIHALER 18 MCG inhalation capsule PLACE ONE CAPSULE IN MOUTHPIECE (HANDIHALER) AND INHALE IN MOUTH ONCE A DAY.  30 capsule  6  . SYMBICORT 160-4.5 MCG/ACT inhaler INHALE 2 PUFFS INTO MOUTH TWICE DAILY  10.2 g  6  . warfarin (COUMADIN) 5 MG tablet TAKE AS DIRECTED  45 tablet  6   No current facility-administered medications for this visit.    History   Social History  . Marital Status: Married    Spouse Name: N/A    Number of Children: N/A  . Years of Education: N/A   Occupational History  . Dance movement psychotherapist    Social History Main Topics  . Smoking status: Former Smoker -- 2.00 packs/day for 40 years    Types: Cigarettes    Quit date: 01/06/1998  . Smokeless tobacco: Never Used  . Alcohol Use: No  . Drug Use: No  . Sexually Active: Not on file   Other Topics  Concern  . Not on file   Social History Narrative  . No narrative on file    Family History  Problem Relation Age of Onset  . Heart disease Mother   . Cancer Father     throat    Past Medical History  Diagnosis Date  . COPD with emphysema     chronic Dyspnea, followed by Dr. Dennie Bible. Wright, oxygen at night started 11/2008  . Atrial fibrillation     Permanent  . Warfarin anticoagulation     Coumadin  . Right ventricular dysfunction     Right Ventricle, mild moderately dilated by Echo 06/2008  . CAD (coronary artery disease)     .MI with a Taxus stent in 2004..  /   nuclear... June, 2010.. small scar or slight ischemia inferior wall  . Diabetes mellitus   . Hyperlipemia   . Obesity   . Peripheral edema     Mild  . Hypertension     relative hypotension... August, 2011... Cozaar stopped  . Foot pain     Significant neuropathy has been evaluated at [facility].  . Ejection fraction     EF 60%, echo, 2010, technically difficult study  . Drug  allergy     Plavix causes severe rash  . Pulmonary hypertension     PA pressure 45-50 mmHg, echo, June, 2013, patient hospitalized with a COPD exacerbation at that time.  . Constipation     July, 2013  . Blisters of multiple sites     Patient has blisters with clear liquid. There are 5 or more on the great toe and second toe of the right foot. There is one on the left great toe.  Marland Kitchen PAD (peripheral artery disease)     Vascular surgery consultation September, 2013  . Chronic diastolic CHF (congestive heart failure)     Patient requires diuretics.    Past Surgical History  Procedure Laterality Date  . Rotator cuff repair      x 3  . Knee surgery      Patient Active Problem List   Diagnosis Date Noted  . At high risk for falls 05/04/2012  . PVD (peripheral vascular disease) 03/04/2012  . Pain in joint, ankle and foot 03/04/2012  . Edema of both legs 02/13/2012  . Chronic venous insufficiency 11/07/2011  . Chronic diastolic CHF (congestive heart failure)   . PAD (peripheral artery disease)   . Peripheral arterial occlusive disease 10/10/2011  . Venous insufficiency 10/10/2011  . Bilateral leg edema 10/10/2011  . Blisters of multiple sites   . Chronic respiratory failure 07/08/2011  . Ejection fraction   . Pulmonary hypertension   . Drug allergy   . Constipation   . Right ventricular dysfunction   . COPD with emphysema Gold D   . Atrial fibrillation   . Warfarin anticoagulation   . CAD (coronary artery disease)   . Hyperlipemia   . Obesity   . Peripheral edema   . Hypertension   . Foot pain     ROS   Patient denies fever, chills, headache, sweats sweats, rash, change in vision, change in hearing, GI symptoms, urinary symptoms. All other systems are reviewed and are negative.  PHYSICAL EXAM  The patient's weight is actually down 5 pounds from his last visit in February, 2014. He is oriented to person time and place. Affect is normal. Lung sounds are quite distant. He  has limited walking capability without feeling significantly short of breath. Cardiac exam  reveals very distant heart sounds. The abdomen is soft. He has trace peripheral edema.  Filed Vitals:   05/10/12 1017  BP: 121/81  Pulse: 75  Height: 5\' 9"  (1.753 m)  Weight: 242 lb 12.8 oz (110.133 kg)  SpO2: 95%     ASSESSMENT & PLAN

## 2012-05-10 NOTE — Patient Instructions (Addendum)
Your physician recommends that you schedule a follow-up appointment in: 6 weeks. Your physician has recommended you make the following change in your medication: Increase your fluid pill (furosemide) taking 1 &1/2 tablet twice daily for 2 days only then resume previous dosing. All other medications will remain the same. Your physician recommends that you return for lab work [id number] for BMET at [facility]. Please continue to reduce your salt and fluid intake.

## 2012-05-10 NOTE — Assessment & Plan Note (Signed)
Atrial fibrillation rate is controlled. He is anticoagulated. No change in therapy. 

## 2012-05-10 NOTE — Progress Notes (Signed)
Saw lung doctor last week and did not change anything.

## 2012-05-10 NOTE — Assessment & Plan Note (Signed)
His overall weight is actually down. He does have mild edema in his ankles only. I'm very hesitant to push his meds further. I've asked him to take a higher dose of diuretic for 2 days and then returned was usual dosing. I have encouraged him once again to continue to limit his salt but also try to limit his fluid intake. Chemistry lab will be checked today.

## 2012-05-18 ENCOUNTER — Telehealth: Payer: Self-pay | Admitting: *Deleted

## 2012-05-18 NOTE — Telephone Encounter (Signed)
Patient informed via message machine. 

## 2012-05-18 NOTE — Telephone Encounter (Signed)
Message copied by Eustace Moore on Tue May 18, 2012 11:38 AM ------      Message from: Myrtis Ser, [place] D      Created: Sun May 16, 2012  6:07 PM       His lab is okay. ------

## 2012-06-01 ENCOUNTER — Ambulatory Visit (INDEPENDENT_AMBULATORY_CARE_PROVIDER_SITE_OTHER): Payer: Medicare Other | Admitting: *Deleted

## 2012-06-01 DIAGNOSIS — Z7901 Long term (current) use of anticoagulants: Secondary | ICD-10-CM

## 2012-06-01 DIAGNOSIS — I4891 Unspecified atrial fibrillation: Secondary | ICD-10-CM

## 2012-06-28 ENCOUNTER — Encounter: Payer: Self-pay | Admitting: Cardiology

## 2012-06-28 ENCOUNTER — Ambulatory Visit (INDEPENDENT_AMBULATORY_CARE_PROVIDER_SITE_OTHER): Payer: Medicare Other | Admitting: Cardiology

## 2012-06-28 ENCOUNTER — Ambulatory Visit: Payer: Medicare Other | Admitting: Cardiology

## 2012-06-28 VITALS — BP 110/76 | HR 74 | Ht 69.0 in | Wt 243.0 lb

## 2012-06-28 DIAGNOSIS — R609 Edema, unspecified: Secondary | ICD-10-CM

## 2012-06-28 DIAGNOSIS — I10 Essential (primary) hypertension: Secondary | ICD-10-CM

## 2012-06-28 DIAGNOSIS — T148XXA Other injury of unspecified body region, initial encounter: Secondary | ICD-10-CM

## 2012-06-28 DIAGNOSIS — R600 Localized edema: Secondary | ICD-10-CM

## 2012-06-28 DIAGNOSIS — Z7901 Long term (current) use of anticoagulants: Secondary | ICD-10-CM

## 2012-06-28 DIAGNOSIS — I5032 Chronic diastolic (congestive) heart failure: Secondary | ICD-10-CM

## 2012-06-28 DIAGNOSIS — R0989 Other specified symptoms and signs involving the circulatory and respiratory systems: Secondary | ICD-10-CM

## 2012-06-28 DIAGNOSIS — R9430 Abnormal result of cardiovascular function study, unspecified: Secondary | ICD-10-CM

## 2012-06-28 DIAGNOSIS — R238 Other skin changes: Secondary | ICD-10-CM

## 2012-06-28 DIAGNOSIS — I2510 Atherosclerotic heart disease of native coronary artery without angina pectoris: Secondary | ICD-10-CM

## 2012-06-28 DIAGNOSIS — J9610 Chronic respiratory failure, unspecified whether with hypoxia or hypercapnia: Secondary | ICD-10-CM

## 2012-06-28 DIAGNOSIS — I4891 Unspecified atrial fibrillation: Secondary | ICD-10-CM

## 2012-06-28 DIAGNOSIS — I509 Heart failure, unspecified: Secondary | ICD-10-CM

## 2012-06-28 MED ORDER — SPIRONOLACTONE 25 MG PO TABS: 12.5000 mg | ORAL_TABLET | Freq: Every day | ORAL | Status: DC

## 2012-06-28 NOTE — Assessment & Plan Note (Signed)
He continues on Coumadin for his atrial fibrillation.  As part of today's evaluation I spent greater than 25 minutes with is total care. More than half of this time was spent with direct contact with him. This included a careful discussion about all of his issues and the assessment of his finger.

## 2012-06-28 NOTE — Assessment & Plan Note (Signed)
He has permanent atrial fibrillation. Rate is controlled. He is anticoagulated. No change in therapy.

## 2012-06-28 NOTE — Assessment & Plan Note (Signed)
He has normal left and trig liver function. His last echo was June, 2013. He does not need a repeat echo.

## 2012-06-28 NOTE — Assessment & Plan Note (Signed)
The etiology of the blisters is not clear. Original he they were on his feet. He is now had them on his hands. I've suggested that he and his primary physician may want to consider dermatology evaluation.

## 2012-06-28 NOTE — Assessment & Plan Note (Signed)
Patient requires high dose diuretics. He is also on potassium. I have decided to add 12.5 mg of spironolactone. We will be very careful with frequent followup labs. His most recent potassium was 3.6. Lab will be checked again today. His current potassium dose is 40 mEq in the morning and 20 in the evening. I will cut this back to 20 twice a day as we're starting spironolactone.

## 2012-06-28 NOTE — Assessment & Plan Note (Signed)
Coronary disease is stable. He does not need any testing at this time.

## 2012-06-28 NOTE — Assessment & Plan Note (Signed)
Blood pressures controlled. No change in therapy. 

## 2012-06-28 NOTE — Progress Notes (Signed)
Patient ID: [name], male   DOB: 09-26-1940, 72 y.o.   MRN: [id number]   HPI  Patient is seen today to followup chronic diastolic heart failure. His weight is similar to his last visit. He's feeling stable. Once again he is bothered by a blister that has appeared. Previously these were on his feet. He now has one on the tip of his finger. This drained clear fluid. He saw his primary physician who put him on antibiotic.  The patient feels that his support hose are no longer helping him. They're very difficult for him to wear and he is not wearing them at this time.  Allergies  Allergen Reactions  . Clopidogrel Bisulfate     REACTION: rash    Current Outpatient Prescriptions  Medication Sig Dispense Refill  . albuterol (PROVENTIL) (2.5 MG/3ML) 0.083% nebulizer solution Take 3 mLs (2.5 mg total) by nebulization every 6 (six) hours as needed for wheezing.  75 mL  12  . albuterol (VENTOLIN HFA) 108 (90 BASE) MCG/ACT inhaler Inhale 2 puffs into the lungs every 6 (six) hours as needed for wheezing.  1 Inhaler  6  . aspirin 81 MG tablet Take 81 mg by mouth daily.        . digoxin (LANOXIN) 0.125 MG tablet Take by mouth daily. Take 1/2 tablet once a day      . furosemide (LASIX) 80 MG tablet 1&1/2 TABLETS IN THE AM AND 1 TABLET IN THE PM  75 tablet  6  . gabapentin (NEURONTIN) 300 MG capsule Take 4 tablets by mouth two times a day.       . lovastatin (MEVACOR) 20 MG tablet Take 20 mg by mouth 2 (two) times daily.      . metFORMIN (GLUCOPHAGE) 500 MG tablet Take 500 mg by mouth 2 (two) times daily.       . metoprolol (LOPRESSOR) 50 MG tablet Take 25 mg by mouth 2 (two) times daily.      . Multiple Vitamins-Minerals (CENTRUM PO) Take 1 tablet by mouth daily.        Marland Kitchen NITROSTAT 0.4 MG SL tablet Place 0.4 mg under the tongue every 5 (five) minutes as needed.       . Omega-3 Fatty Acids (FISH OIL) 1000 MG CAPS Take 1 capsule by mouth daily.        . potassium chloride SA (K-DUR,KLOR-CON) 20 MEQ  tablet Take 2 tablets (40 mEq total) by mouth every morning. & 1 in the evening  90 tablet  3  . predniSONE (DELTASONE) 10 MG tablet Take 10 mg by mouth daily.      Marland Kitchen Respiratory Therapy Supplies (FLUTTER) DEVI Use 4 times daily  1 each  0  . SPIRIVA HANDIHALER 18 MCG inhalation capsule PLACE ONE CAPSULE IN MOUTHPIECE (HANDIHALER) AND INHALE IN MOUTH ONCE A DAY.  30 capsule  6  . SSD 1 % cream Apply 1 application topically 2 (two) times daily.       Marland Kitchen sulfamethoxazole-trimethoprim (BACTRIM DS) 800-160 MG per tablet Take 1 tablet by mouth 2 (two) times daily.       . SYMBICORT 160-4.5 MCG/ACT inhaler INHALE 2 PUFFS INTO MOUTH TWICE DAILY  10.2 g  6  . warfarin (COUMADIN) 5 MG tablet TAKE AS DIRECTED  45 tablet  6   No current facility-administered medications for this visit.    History   Social History  . Marital Status: Married    Spouse Name: N/A    Number  of Children: N/A  . Years of Education: N/A   Occupational History  . Dance movement psychotherapist    Social History Main Topics  . Smoking status: Former Smoker -- 2.00 packs/day for 40 years    Types: Cigarettes    Quit date: 01/06/1998  . Smokeless tobacco: Never Used  . Alcohol Use: No  . Drug Use: No  . Sexually Active: Not on file   Other Topics Concern  . Not on file   Social History Narrative  . No narrative on file    Family History  Problem Relation Age of Onset  . Heart disease Mother   . Cancer Father     throat    Past Medical History  Diagnosis Date  . COPD with emphysema     chronic Dyspnea, followed by Dr. Dennie Bible. [place], oxygen at night started 11/2008  . Atrial fibrillation     Permanent  . Warfarin anticoagulation     Coumadin  . Right ventricular dysfunction     Right Ventricle, mild moderately dilated by Echo 06/2008  . CAD (coronary artery disease)     .MI with a Taxus stent in 2004..  /   nuclear... June, 2010.. small scar or slight ischemia inferior wall  . Diabetes mellitus   . Hyperlipemia   .  Obesity   . Peripheral edema     Mild  . Hypertension     relative hypotension... August, 2011... Cozaar stopped  . Foot pain     Significant neuropathy has been evaluated at [facility].  . Ejection fraction     EF 60%, echo, 2010, technically difficult study  . Drug allergy     Plavix causes severe rash  . Pulmonary hypertension     PA pressure 45-50 mmHg, echo, June, 2013, patient hospitalized with a COPD exacerbation at that time.  . Constipation     July, 2013  . Blisters of multiple sites     Patient has blisters with clear liquid. There are 5 or more on the great toe and second toe of the right foot. There is one on the left great toe.  Marland Kitchen PAD (peripheral artery disease)     Vascular surgery consultation September, 2013  . Chronic diastolic CHF (congestive heart failure)     Patient requires diuretics.    Past Surgical History  Procedure Laterality Date  . Rotator cuff repair      x 3  . Knee surgery      Patient Active Problem List   Diagnosis Date Noted  . At high risk for falls 05/04/2012  . PVD (peripheral vascular disease) 03/04/2012  . Pain in joint, ankle and foot 03/04/2012  . Edema of both legs 02/13/2012  . Chronic venous insufficiency 11/07/2011  . Chronic diastolic CHF (congestive heart failure)   . PAD (peripheral artery disease)   . Peripheral arterial occlusive disease 10/10/2011  . Venous insufficiency 10/10/2011  . Bilateral leg edema 10/10/2011  . Blisters of multiple sites   . Chronic respiratory failure 07/08/2011  . Ejection fraction   . Pulmonary hypertension   . Drug allergy   . Constipation   . Right ventricular dysfunction   . COPD with emphysema Gold D   . Atrial fibrillation   . Warfarin anticoagulation   . CAD (coronary artery disease)   . Hyperlipemia   . Obesity   . Peripheral edema   . Hypertension   . Foot pain     ROS   Patient  denies fever, chills, headache, sweats, rash, change in vision, change  in hearing, chest pain, , nausea vomiting, urinary symptoms. All the systems are reviewed and are negative.  PHYSICAL EXAM   Patient is oriented to person time and place. Affect is normal. He is overweight. There is no jugulovenous distention. Lungs reveal decreased breath sounds. Cardiac exam reveals distant heart sounds. The abdomen is soft. There is trace peripheral edema in the left leg. There no musculoskeletal deformities. The skin on the tip of his finger is healing after the blister tissue was removed.  Filed Vitals:   06/28/12 1028  BP: 110/76  Pulse: 74  Height: 5\' 9"  (1.753 m)  Weight: 243 lb (110.224 kg)     ASSESSMENT & PLAN

## 2012-06-28 NOTE — Assessment & Plan Note (Signed)
His edema is only mild today. He thinks his support hose are no longer helping. I'm hesitant for him to stop using them. For now we will follow him while not using them because he he does not want to.

## 2012-06-28 NOTE — Patient Instructions (Addendum)
Your physician recommends that you schedule a follow-up appointment in: 6 weeks.  Your physician has recommended you make the following change in your medication: You have been given a new prescription for spironolactone 12. 5 mg daily. Please do not start this new medicine until we call you back with your lab results from today. Your new prescription has been sent to your pharmacy. All other medications will remain the same. Your physician recommends that you have BMET lab work TODAY, again in one week on 07/05/12, and again in 3 weeks on 07/19/12. Please have this done at [facility].

## 2012-06-28 NOTE — Assessment & Plan Note (Signed)
He follows very carefully with the pulmonary team for this problem.

## 2012-06-29 ENCOUNTER — Encounter: Payer: Self-pay | Admitting: Cardiology

## 2012-06-29 ENCOUNTER — Telehealth: Payer: Self-pay | Admitting: *Deleted

## 2012-06-29 NOTE — Telephone Encounter (Signed)
Patient informed. 

## 2012-06-29 NOTE — Telephone Encounter (Signed)
Message copied by Eustace Moore on Tue Jun 29, 2012  2:37 PM ------      Message from: Myrtis Ser, [place] D      Created: Tue Jun 29, 2012  1:23 PM       I put  a documentation note in the record. I'm concerned with his creatinine up to 1.7. I do NOT want him to start spironolactone. We should cancel the prescription. Please do a followup bMet in 2 weeks. ------

## 2012-06-29 NOTE — Progress Notes (Signed)
   I saw the patient in the office yesterday. I consider starting him on spironolactone. We checked his chemistry and his creatinine was up to 1.74. This is a GFR of 39. His creatinine ranges up to this area but as low as 1.3. Spironolactone can be used with a GFR at this level. However with the recent change in his renal function I am very hesitant. We will instruct him not to fill the prescription for the spironolactone. Will recheck his labs again in 2 weeks.  Jerral Bonito, MD

## 2012-07-13 ENCOUNTER — Ambulatory Visit (INDEPENDENT_AMBULATORY_CARE_PROVIDER_SITE_OTHER): Payer: Medicare Other | Admitting: *Deleted

## 2012-07-13 DIAGNOSIS — Z7901 Long term (current) use of anticoagulants: Secondary | ICD-10-CM

## 2012-07-13 DIAGNOSIS — I4891 Unspecified atrial fibrillation: Secondary | ICD-10-CM

## 2012-07-16 ENCOUNTER — Telehealth: Payer: Self-pay | Admitting: *Deleted

## 2012-07-16 MED ORDER — POTASSIUM CHLORIDE CRYS ER 20 MEQ PO TBCR: 40.0000 meq | EXTENDED_RELEASE_TABLET | Freq: Two times a day (BID) | ORAL | Status: DC

## 2012-07-16 NOTE — Telephone Encounter (Signed)
Patient informed and verbalized understanding of plan. 

## 2012-07-16 NOTE — Telephone Encounter (Signed)
Message copied by Eustace Moore on Fri Jul 16, 2012 11:03 AM ------      Message from: Eustace Moore      Created: Thu Jul 15, 2012  8:38 AM       Informed Gene that patient is currently on K+40 am/20 pm and he suggest patient increase to 40 am/40 pm. ------

## 2012-08-02 ENCOUNTER — Other Ambulatory Visit: Payer: Self-pay | Admitting: Critical Care Medicine

## 2012-08-04 ENCOUNTER — Encounter: Payer: Self-pay | Admitting: Critical Care Medicine

## 2012-08-04 ENCOUNTER — Ambulatory Visit (INDEPENDENT_AMBULATORY_CARE_PROVIDER_SITE_OTHER): Payer: Medicare Other | Admitting: Critical Care Medicine

## 2012-08-04 VITALS — BP 106/70 | HR 91 | Temp 97.6°F | Ht 69.0 in | Wt 243.4 lb

## 2012-08-04 DIAGNOSIS — J438 Other emphysema: Secondary | ICD-10-CM

## 2012-08-04 DIAGNOSIS — J439 Emphysema, unspecified: Secondary | ICD-10-CM

## 2012-08-04 MED ORDER — PREDNISONE 10 MG PO TABS: 10.0000 mg | ORAL_TABLET | Freq: Every day | ORAL | Status: DC

## 2012-08-04 NOTE — Assessment & Plan Note (Signed)
Chronic obstructive lung disease gold stage D. oxygen dependent at night Stable at this time Plan Maintain inhaled medications as prescribed Maintain nocturnal oxygen therapy

## 2012-08-04 NOTE — Progress Notes (Signed)
  Subjective:    Patient ID: [name], male    DOB: 04/21/1940, 72 y.o.   MRN: [id number]  HPI [name] is a 72 yo male with history of RV dysfunction, COPD Gold D, emphysema, A fib, CAD, DM 2, pulm HTN.   ROV>>> 08/04/12 CC dyspnea on exterion. States his dyspnea is about the same as his last visit. He has productive cough of white mucus and wheezing, but denies chest tightness and wheezing. He is currently on 2 L of oxygen at night. He had a recent visit to [facility] cardiology for dystolic heart failure (06/28/12) and was changed to spironolactone. Today his CAT score is 20. He denies any increase in his albuterol use. He sleeps well and denies nocturnal awakenings.  Review of Systems  Constitutional: Positive for fatigue. Negative for fever, chills and diaphoresis.  HENT: Negative for congestion, rhinorrhea, sneezing and postnasal drip.   Respiratory: Positive for cough and shortness of breath. Negative for choking, chest tightness and wheezing.       Objective:   Physical Exam  VITAL SIGNS:  BP 106/70  Pulse 91  Temp(Src) 97.6 F (36.4 C) (Oral)  Ht 5\' 9"  (1.753 m)  Wt 243 lb 6.4 oz (110.406 kg)  BMI 35.93 kg/m2  SpO2 92%   HEENT: Head is normocephalic and atraumatic. Extraocular muscles are intact. Pupils are equal, round, and reactive to light. Turbinates are not inflamed, no polyps observed.  Nares appeared normal. Mouth is well hydrated and without lesions. Mucous membranes are moist. Posterior pharynx clear of any exudate or lesions. NECK: Supple. No carotid bruits. No lymphadenopathy or thyromegaly. LUNGS: Clear to auscultation. HEART: Regular rate and rhythm without murmur.  ABDOMEN: Soft, nontender, and nondistended. Positive bowel sounds. No hepatosplenomegaly was noted.  EXTREMITIES: Without any cyanosis, clubbing, rash, lesions or edema. Has bruising on arms.  NEUROLOGIC: Cranial nerves II through XII are grossly intact.  SKIN: No ulceration or induration  present.       Assessment & Plan:   Chronic Respiratory Failure - COPD gold stage D - Currently only needs oxygen at night and as needed with extreme exertion during the day  COPD with emphysema GOLD D Stable at this point - Sent the prednisone refill to your pharmacy - No changes on medications - Follow up in 3 months

## 2012-08-04 NOTE — Patient Instructions (Addendum)
No change in medications. Return in       3 months Recommend [employer] system for diabetes checks

## 2012-08-11 ENCOUNTER — Ambulatory Visit (INDEPENDENT_AMBULATORY_CARE_PROVIDER_SITE_OTHER): Payer: Medicare Other | Admitting: Cardiology

## 2012-08-11 ENCOUNTER — Other Ambulatory Visit: Payer: Self-pay | Admitting: Cardiology

## 2012-08-11 ENCOUNTER — Encounter: Payer: Self-pay | Admitting: Cardiology

## 2012-08-11 VITALS — BP 134/87 | HR 73 | Ht 69.0 in | Wt 247.0 lb

## 2012-08-11 DIAGNOSIS — Z789 Other specified health status: Secondary | ICD-10-CM | POA: Insufficient documentation

## 2012-08-11 DIAGNOSIS — Z888 Allergy status to other drugs, medicaments and biological substances status: Secondary | ICD-10-CM

## 2012-08-11 DIAGNOSIS — I4891 Unspecified atrial fibrillation: Secondary | ICD-10-CM

## 2012-08-11 DIAGNOSIS — I5032 Chronic diastolic (congestive) heart failure: Secondary | ICD-10-CM

## 2012-08-11 DIAGNOSIS — I509 Heart failure, unspecified: Secondary | ICD-10-CM

## 2012-08-11 MED ORDER — WARFARIN SODIUM 5 MG PO TABS: ORAL_TABLET | ORAL | Status: DC

## 2012-08-11 NOTE — Assessment & Plan Note (Signed)
Patient is relatively stable at this point. No change in therapy.

## 2012-08-11 NOTE — Telephone Encounter (Signed)
Refill

## 2012-08-11 NOTE — Assessment & Plan Note (Signed)
I decided not to use spironolactone at this time.

## 2012-08-11 NOTE — Patient Instructions (Addendum)
Your physician recommends that you schedule a follow-up appointment in: 3 months. Your physician recommends that you continue on your current medications as directed. Please refer to the Current Medication list given to you today. 

## 2012-08-11 NOTE — Progress Notes (Signed)
HPI  Patient is seen today to followup chronic diastolic heart failure. When I saw him last on June 29, 2012 I noted that he required high dose diuretics. I decided to try a small dose of spironolactone. He was started on 12.5 mg in his creatinine went from 1.3-1.7. I decided to stop the drug. His creatinine came down to 1.37. The patient has diastolic heart failure. There is no reason to try to push further with using spironolactone.  He is feeling relatively well.  Allergies  Allergen Reactions  . Clopidogrel Bisulfate     REACTION: rash    Current Outpatient Prescriptions  Medication Sig Dispense Refill  . albuterol (PROVENTIL) (2.5 MG/3ML) 0.083% nebulizer solution Take 3 mLs (2.5 mg total) by nebulization every 6 (six) hours as needed for wheezing.  75 mL  12  . albuterol (VENTOLIN HFA) 108 (90 BASE) MCG/ACT inhaler Inhale 2 puffs into the lungs every 6 (six) hours as needed for wheezing.  1 Inhaler  6  . aspirin 81 MG tablet Take 81 mg by mouth daily.        . digoxin (LANOXIN) 0.125 MG tablet Take by mouth daily. Take 1/2 tablet once a day      . furosemide (LASIX) 80 MG tablet 1&1/2 TABLETS IN THE AM AND 1 TABLET IN THE PM  75 tablet  6  . gabapentin (NEURONTIN) 300 MG capsule Take 4 tablets by mouth in am, 2 tablets at noon, and 4 tablets at night      . lovastatin (MEVACOR) 20 MG tablet Take 20 mg by mouth 2 (two) times daily.      . metFORMIN (GLUCOPHAGE) 500 MG tablet Take 500 mg by mouth 2 (two) times daily.       . metoprolol (LOPRESSOR) 50 MG tablet Take 25 mg by mouth 2 (two) times daily.      . Multiple Vitamins-Minerals (CENTRUM PO) Take 1 tablet by mouth daily.        Marland Kitchen NITROSTAT 0.4 MG SL tablet Place 0.4 mg under the tongue every 5 (five) minutes as needed.       . Omega-3 Fatty Acids (FISH OIL) 1000 MG CAPS Take 1 capsule by mouth daily.        . potassium chloride SA (K-DUR,KLOR-CON) 20 MEQ tablet Take 2 tablets (40 mEq total) by mouth 2 (two) times daily.  120  tablet  3  . predniSONE (DELTASONE) 10 MG tablet Take 1 tablet (10 mg total) by mouth daily.  30 tablet  6  . Respiratory Therapy Supplies (FLUTTER) DEVI Use 4 times daily  1 each  0  . SPIRIVA HANDIHALER 18 MCG inhalation capsule PLACE ONE CAPSULE IN MOUTHPIECE (HANDIHALER) AND INHALE IN MOUTH ONCE A DAY.  30 capsule  5  . SSD 1 % cream Apply 1 application topically 2 (two) times daily.       Marland Kitchen sulfamethoxazole-trimethoprim (BACTRIM DS) 800-160 MG per tablet Take 1 tablet by mouth once.       . SYMBICORT 160-4.5 MCG/ACT inhaler INHALE 2 PUFFS INTO MOUTH TWICE DAILY  10.2 g  6  . warfarin (COUMADIN) 5 MG tablet Take as directed  45 tablet  6   No current facility-administered medications for this visit.    History   Social History  . Marital Status: Married    Spouse Name: N/A    Number of Children: N/A  . Years of Education: N/A   Occupational History  . Dance movement psychotherapist  Social History Main Topics  . Smoking status: Former Smoker -- 2.00 packs/day for 40 years    Types: Cigarettes    Quit date: 01/06/1998  . Smokeless tobacco: Never Used  . Alcohol Use: No  . Drug Use: No  . Sexually Active: Not on file   Other Topics Concern  . Not on file   Social History Narrative  . No narrative on file    Family History  Problem Relation Age of Onset  . Heart disease Mother   . Cancer Father     throat    Past Medical History  Diagnosis Date  . COPD with emphysema     chronic Dyspnea, followed by Dr. Dennie Bible. [place], oxygen at night started 11/2008  . Atrial fibrillation     Permanent  . Warfarin anticoagulation     Coumadin  . Right ventricular dysfunction     Right Ventricle, mild moderately dilated by Echo 06/2008  . CAD (coronary artery disease)     .MI with a Taxus stent in 2004..  /   nuclear... June, 2010.. small scar or slight ischemia inferior wall  . Diabetes mellitus   . Hyperlipemia   . Obesity   . Peripheral edema     Mild  . Hypertension     relative  hypotension... August, 2011... Cozaar stopped  . Foot pain     Significant neuropathy has been evaluated at [facility].  . Ejection fraction     EF 60%, echo, 2010, technically difficult study  . Drug allergy     Plavix causes severe rash  . Pulmonary hypertension     PA pressure 45-50 mmHg, echo, June, 2013, patient hospitalized with a COPD exacerbation at that time.  . Constipation     July, 2013  . Blisters of multiple sites     Patient has blisters with clear liquid. There are 5 or more on the great toe and second toe of the right foot. There is one on the left great toe.  Marland Kitchen PAD (peripheral artery disease)     Vascular surgery consultation September, 2013  . Chronic diastolic CHF (congestive heart failure)     Patient requires diuretics.    Past Surgical History  Procedure Laterality Date  . Rotator cuff repair      x 3  . Knee surgery      Patient Active Problem List   Diagnosis Date Noted  . At high risk for falls 05/04/2012  . PVD (peripheral vascular disease) 03/04/2012  . Pain in joint, ankle and foot 03/04/2012  . Edema of both legs 02/13/2012  . Chronic venous insufficiency 11/07/2011  . Chronic diastolic CHF (congestive heart failure)   . PAD (peripheral artery disease)   . Peripheral arterial occlusive disease 10/10/2011  . Venous insufficiency 10/10/2011  . Bilateral leg edema 10/10/2011  . Blisters of multiple sites   . Chronic respiratory failure 07/08/2011  . Ejection fraction   . Pulmonary hypertension   . Drug allergy   . Constipation   . Right ventricular dysfunction   . COPD with emphysema Gold D   . Atrial fibrillation   . Warfarin anticoagulation   . CAD (coronary artery disease)   . Hyperlipemia   . Obesity   . Peripheral edema   . Hypertension   . Foot pain     ROS   Patient denies fever, chills, headache, sweats, rash, change in vision, change in hearing, chest pain, nausea vomiting, urinary symptoms. All other  systems are reviewed and are negative.  PHYSICAL EXAM  Patient's oriented to person time and place. Affect is normal. There is no jugulovenous distention. Lungs reveal decreased breath sounds. Cardiac exam reveals S1 and S2. Abdomen is benign. There is 1+ peripheral edema in the left leg.  Filed Vitals:   08/11/12 1435  BP: 134/87  Pulse: 73  Height: 5\' 9"  (1.753 m)  Weight: 247 lb (112.038 kg)     ASSESSMENT & PLAN

## 2012-08-11 NOTE — Assessment & Plan Note (Signed)
Atrial fib rate is controlled. No change in therapy. 

## 2012-08-12 ENCOUNTER — Encounter: Payer: Self-pay | Admitting: Vascular Surgery

## 2012-08-13 ENCOUNTER — Encounter: Payer: Self-pay | Admitting: Vascular Surgery

## 2012-08-13 ENCOUNTER — Encounter (INDEPENDENT_AMBULATORY_CARE_PROVIDER_SITE_OTHER): Payer: Medicare Other

## 2012-08-13 ENCOUNTER — Other Ambulatory Visit: Payer: Self-pay | Admitting: *Deleted

## 2012-08-13 ENCOUNTER — Ambulatory Visit (INDEPENDENT_AMBULATORY_CARE_PROVIDER_SITE_OTHER): Payer: Medicare Other | Admitting: Vascular Surgery

## 2012-08-13 VITALS — BP 129/89 | HR 88 | Resp 16 | Ht 69.0 in | Wt 244.0 lb

## 2012-08-13 DIAGNOSIS — R600 Localized edema: Secondary | ICD-10-CM

## 2012-08-13 DIAGNOSIS — R609 Edema, unspecified: Secondary | ICD-10-CM

## 2012-08-13 DIAGNOSIS — L97909 Non-pressure chronic ulcer of unspecified part of unspecified lower leg with unspecified severity: Secondary | ICD-10-CM

## 2012-08-13 DIAGNOSIS — L98499 Non-pressure chronic ulcer of skin of other sites with unspecified severity: Secondary | ICD-10-CM

## 2012-08-13 DIAGNOSIS — M79609 Pain in unspecified limb: Secondary | ICD-10-CM

## 2012-08-13 DIAGNOSIS — I779 Disorder of arteries and arterioles, unspecified: Secondary | ICD-10-CM

## 2012-08-13 DIAGNOSIS — I872 Venous insufficiency (chronic) (peripheral): Secondary | ICD-10-CM

## 2012-08-13 DIAGNOSIS — I739 Peripheral vascular disease, unspecified: Secondary | ICD-10-CM

## 2012-08-13 NOTE — Progress Notes (Signed)
[facility] [place]  Established Venous Insufficiency  History of Present Illness  [name] is a 72 y.o. (1940-08-13) male who presents with chief complaint: right finger wound.  The patient's symptoms have improved in his legs.  He continues to have chronic swelling in both legs, but doesn't feel he needs to wear his OTC compression stocking anymore.  He has had no further stasis ulcers.  Recently, the patient has been developing blistering in his fingers that heal with time.  He has had a skin biopsy to try to determine the etiology of this patient's blistering.  By report, he was told it was related to his DM.  The patient's treatment regimen currently included: maximal medical management.  His COPD continues to limit his ability to ambulated.  The patient's PMH, PSH, SH, FamHx, Med, and Allergies are unchanged from 02/13/12.  On ROS today: no claudication, no rest pain  Physical Examination  Filed Vitals:   08/13/12 1008  BP: 129/89  Pulse: 88  Resp: 16  Height: 5\' 9"  (1.753 m)  Weight: 244 lb (110.678 kg)  SpO2: 96%   Body mass index is 36.02 kg/(m^2).  General: A&O x 3, WD, Obese,   Pulmonary: Sym exp, good air movt, CTAB, no rales, rhonchi, & wheezing, somewhat dysnpenic  Cardiac: RRR, Nl S1, S2, no Murmurs, rubs or gallops  Vascular: Vessel Right Left  Radial Palpable Palpable  Brachial Palpable Palpable  Carotid Palpable, without bruit Palpable, without bruit  Aorta Not palpable N/A  Femoral Palpable Palpable  Popliteal Not palpable Not palpable  PT Faintly Palpable FaintlyPalpable  DP Faintly Palpable Faintly Palpable   Gastrointestinal: soft, NTND, -G/R, - HSM, - masses, - CVAT B, aorta cannot be palp due to obesity  Musculoskeletal: M/S 5/5 throughout , Extremities without ischemic changes , B mild cyanosis, 2+ pitting edema B  Neurologic: Pain and light touch intact in extremities , Motor exam as listed above  Non-Invasive Vascular  Imaging  ABI (Date: 08/13/2012)  R: 1.07 (1.24), DP: tri, PT: bi, TBI: 0.90  L: 1.22 (1.23), DP: bi, PT: tri, TBI: 0.83  WBI (Date: 08/13/2012)  R: 1.29, Radial: tri, Ulnar: tri, brachial: tri  L: 1.24, Radial: tri, Ulnar: tri, brachial: tri  Medical Decision Making  [name] is a 72 y.o. male who presents with: B leg chronic venous insufficiency (C5), BUE rash unknown etiology  Blood flow in feet remain stable despite extensive risk factors.  Blood flow in arm is relatively normal with triphasic flow in all arteries, so I doubt a vascular etiology for the finger blistering.  The patient's CVI is stable at this point.  I recommended he continue with his OTC compression stockings.  We will obtain annual ABI to continue surveillance in this patient.  Thank you for allowing [place] to participate in this patient's care.  Leonides Sake, MD [facility] [place] Office: [phone] Pager: [phone]  08/13/2012, 10:27 AM

## 2012-08-13 NOTE — Addendum Note (Signed)
Addended by: Adria Dill L on: 08/13/2012 01:08 PM   Modules accepted: Orders

## 2012-08-24 ENCOUNTER — Ambulatory Visit (INDEPENDENT_AMBULATORY_CARE_PROVIDER_SITE_OTHER): Payer: Medicare Other | Admitting: *Deleted

## 2012-08-24 DIAGNOSIS — I4891 Unspecified atrial fibrillation: Secondary | ICD-10-CM

## 2012-08-24 DIAGNOSIS — Z7901 Long term (current) use of anticoagulants: Secondary | ICD-10-CM

## 2012-08-24 LAB — POCT INR
INR: 1.9
INR: 2.1

## 2012-09-21 ENCOUNTER — Ambulatory Visit (INDEPENDENT_AMBULATORY_CARE_PROVIDER_SITE_OTHER): Payer: Medicare Other | Admitting: *Deleted

## 2012-09-21 DIAGNOSIS — I4891 Unspecified atrial fibrillation: Secondary | ICD-10-CM

## 2012-09-21 DIAGNOSIS — Z7901 Long term (current) use of anticoagulants: Secondary | ICD-10-CM

## 2012-09-21 LAB — POCT INR: INR: 1.6

## 2012-10-04 ENCOUNTER — Other Ambulatory Visit: Payer: Self-pay | Admitting: Critical Care Medicine

## 2012-10-08 ENCOUNTER — Ambulatory Visit (INDEPENDENT_AMBULATORY_CARE_PROVIDER_SITE_OTHER): Payer: Medicare Other | Admitting: *Deleted

## 2012-10-08 DIAGNOSIS — Z7901 Long term (current) use of anticoagulants: Secondary | ICD-10-CM

## 2012-10-08 DIAGNOSIS — I4891 Unspecified atrial fibrillation: Secondary | ICD-10-CM

## 2012-10-08 LAB — POCT INR: INR: 2.2

## 2012-11-02 ENCOUNTER — Ambulatory Visit (INDEPENDENT_AMBULATORY_CARE_PROVIDER_SITE_OTHER): Payer: Medicare Other | Admitting: *Deleted

## 2012-11-02 DIAGNOSIS — I4891 Unspecified atrial fibrillation: Secondary | ICD-10-CM

## 2012-11-02 DIAGNOSIS — Z7901 Long term (current) use of anticoagulants: Secondary | ICD-10-CM

## 2012-11-02 LAB — POCT INR: INR: 1.9

## 2012-11-09 ENCOUNTER — Other Ambulatory Visit: Payer: Self-pay | Admitting: Cardiology

## 2012-11-11 ENCOUNTER — Encounter: Payer: Self-pay | Admitting: Critical Care Medicine

## 2012-11-11 ENCOUNTER — Ambulatory Visit (INDEPENDENT_AMBULATORY_CARE_PROVIDER_SITE_OTHER): Payer: Medicare Other | Admitting: Critical Care Medicine

## 2012-11-11 ENCOUNTER — Other Ambulatory Visit: Payer: Self-pay

## 2012-11-11 VITALS — BP 130/82 | HR 91 | Temp 98.4°F | Ht 69.0 in | Wt 235.8 lb

## 2012-11-11 DIAGNOSIS — J438 Other emphysema: Secondary | ICD-10-CM

## 2012-11-11 DIAGNOSIS — Z23 Encounter for immunization: Secondary | ICD-10-CM

## 2012-11-11 DIAGNOSIS — J439 Emphysema, unspecified: Secondary | ICD-10-CM

## 2012-11-11 DIAGNOSIS — J9610 Chronic respiratory failure, unspecified whether with hypoxia or hypercapnia: Secondary | ICD-10-CM

## 2012-11-11 NOTE — Patient Instructions (Signed)
A prevnar 13 vaccine was given No change in medications  Return 4 months

## 2012-11-11 NOTE — Progress Notes (Signed)
Subjective:    Patient ID: [name], male    DOB: June 24, 1940, 72 y.o.   MRN: [id number]  HPI  [name] is a 72 y.o. male with history of RV dysfunction, COPD Gold D, emphysema, A fib, CAD, DM 2, pulm HTN.    11/11/2012 Chief Complaint  Patient presents with  . Follow-up    Breathing is unchanged since last OV.  No concerns today  Just rx for UTI, on cipro for 14days.  Now is better No resp issues. On the oxygen therapy.  No real chest pain   Review of Systems  Constitutional: Positive for fatigue. Negative for fever, chills and diaphoresis.  HENT: Negative for congestion, postnasal drip, rhinorrhea and sneezing.   Respiratory: Positive for cough and shortness of breath. Negative for choking, chest tightness and wheezing.       Objective:   Physical Exam   VITAL SIGNS:  BP 130/82  Pulse 91  Temp(Src) 98.4 F (36.9 C) (Oral)  Ht 5\' 9"  (1.753 m)  Wt 106.958 kg (235 lb 12.8 oz)  BMI 34.81 kg/m2  SpO2 99%   HEENT: Head is normocephalic and atraumatic. Extraocular muscles are intact. Pupils are equal, round, and reactive to light. Turbinates are not inflamed, no polyps observed.  Nares appeared normal. Mouth is well hydrated and without lesions. Mucous membranes are moist. Posterior pharynx clear of any exudate or lesions. NECK: Supple. No carotid bruits. No lymphadenopathy or thyromegaly. LUNGS: Clear to auscultation. HEART: Regular rate and rhythm without murmur.  ABDOMEN: Soft, nontender, and nondistended. Positive bowel sounds. No hepatosplenomegaly was noted.  EXTREMITIES: Without any cyanosis, clubbing, rash, lesions or edema. Has bruising on arms.  NEUROLOGIC: Cranial nerves II through XII are grossly intact.  SKIN: No ulceration or induration present.       Assessment & Plan:   COPD with emphysema Gold D Gold stage D. COPD with primary emphysematous component and need for ongoing oxygen therapy Stable at this time Plan A prevnar 13 vaccine was  given No change in medications  Return 4 months   Chronic respiratory failure Chronic respiratory failure on stable oxygen settings   Updated Medication List Outpatient Encounter Prescriptions as of 11/11/2012  Medication Sig  . albuterol (PROVENTIL) (2.5 MG/3ML) 0.083% nebulizer solution Take 3 mLs (2.5 mg total) by nebulization every 6 (six) hours as needed for wheezing.  Marland Kitchen albuterol (VENTOLIN HFA) 108 (90 BASE) MCG/ACT inhaler Inhale 2 puffs into the lungs every 6 (six) hours as needed for wheezing.  Marland Kitchen aspirin 81 MG tablet Take 81 mg by mouth daily.    . digoxin (LANOXIN) 0.125 MG tablet Take by mouth daily. Take 1/2 tablet once a day  . furosemide (LASIX) 80 MG tablet TAKE ONE & ONE-HALF (1 & 1/2) TABLETS IN THE MORNING & ONE (1) TABLET IN THE EVENING  . gabapentin (NEURONTIN) 300 MG capsule Take 4 tablets by mouth in am, 2 tablets at noon, and 4 tablets at night  . lovastatin (MEVACOR) 20 MG tablet Take 20 mg by mouth 2 (two) times daily.  . metFORMIN (GLUCOPHAGE) 500 MG tablet Take 500 mg by mouth 2 (two) times daily.   . metoprolol (LOPRESSOR) 50 MG tablet Take 25 mg by mouth 2 (two) times daily.  . Multiple Vitamins-Minerals (CENTRUM PO) Take 1 tablet by mouth daily.    Marland Kitchen NITROSTAT 0.4 MG SL tablet Place 0.4 mg under the tongue every 5 (five) minutes as needed.   . Omega-3 Fatty Acids (FISH OIL) 1000 MG  CAPS Take 1 capsule by mouth daily.    . potassium chloride SA (K-DUR,KLOR-CON) 20 MEQ tablet Take 2 tablets (40 mEq total) by mouth 2 (two) times daily.  . predniSONE (DELTASONE) 10 MG tablet Take 1 tablet (10 mg total) by mouth daily.  Marland Kitchen Respiratory Therapy Supplies (FLUTTER) DEVI Use 4 times daily  . SPIRIVA HANDIHALER 18 MCG inhalation capsule PLACE ONE CAPSULE IN MOUTHPIECE (HANDIHALER) AND INHALE IN MOUTH ONCE A DAY.  . SYMBICORT 160-4.5 MCG/ACT inhaler INHALE 2 PUFFS INTO MOUTH TWICE DAILY  . warfarin (COUMADIN) 5 MG tablet Take as directed  . [DISCONTINUED] SSD 1 % cream  Apply 1 application topically 2 (two) times daily.   . [DISCONTINUED] sulfamethoxazole-trimethoprim (BACTRIM DS) 800-160 MG per tablet Take 1 tablet by mouth once.

## 2012-11-12 NOTE — Assessment & Plan Note (Signed)
Gold stage D. COPD with primary emphysematous component and need for ongoing oxygen therapy Stable at this time Plan A prevnar 13 vaccine was given No change in medications  Return 4 months

## 2012-11-12 NOTE — Assessment & Plan Note (Signed)
Chronic respiratory failure on stable oxygen settings

## 2012-11-15 ENCOUNTER — Other Ambulatory Visit: Payer: Self-pay | Admitting: Physician Assistant

## 2012-11-20 ENCOUNTER — Encounter: Payer: Self-pay | Admitting: Cardiology

## 2012-11-22 ENCOUNTER — Encounter: Payer: Self-pay | Admitting: Cardiology

## 2012-11-22 ENCOUNTER — Ambulatory Visit (INDEPENDENT_AMBULATORY_CARE_PROVIDER_SITE_OTHER): Payer: Medicare Other | Admitting: Cardiology

## 2012-11-22 VITALS — BP 144/80 | HR 86

## 2012-11-22 DIAGNOSIS — I4891 Unspecified atrial fibrillation: Secondary | ICD-10-CM

## 2012-11-22 DIAGNOSIS — I5032 Chronic diastolic (congestive) heart failure: Secondary | ICD-10-CM

## 2012-11-22 DIAGNOSIS — I509 Heart failure, unspecified: Secondary | ICD-10-CM

## 2012-11-22 DIAGNOSIS — J439 Emphysema, unspecified: Secondary | ICD-10-CM

## 2012-11-22 DIAGNOSIS — J438 Other emphysema: Secondary | ICD-10-CM

## 2012-11-22 NOTE — Assessment & Plan Note (Signed)
His volume status is stable. No change in therapy. 

## 2012-11-22 NOTE — Assessment & Plan Note (Signed)
Atrial fib rate is controlled. He is on Coumadin. No change in therapy.

## 2012-11-22 NOTE — Assessment & Plan Note (Signed)
Patient is stable at this time. No change in therapy.

## 2012-11-22 NOTE — Patient Instructions (Signed)
Your physician recommends that you schedule a follow-up appointment in: 3 months. Your physician recommends that you continue on your current medications as directed. Please refer to the Current Medication list given to you today. 

## 2012-11-22 NOTE — Progress Notes (Signed)
HPI  Patient is seen back to followup chronic diastolic heart failure, atrial fibrillation, significant lung disease. I saw him last August 11, 2012. Since that time he has been carefully evaluated by Dr. Imogene Burn of [facility]. It is felt that his arterial disease is stable. It is felt that this is not the basis of the blisters that he has intermittently had on his hands and feet.   Patient is doing well. He was seen recently by his pulmonary physician and was doing well. He's having problems with his peripheral neuropathy. His overall cardiac status is stable.  Allergies  Allergen Reactions  . Clopidogrel Bisulfate     REACTION: rash    Current Outpatient Prescriptions  Medication Sig Dispense Refill  . albuterol (PROVENTIL) (2.5 MG/3ML) 0.083% nebulizer solution Take 3 mLs (2.5 mg total) by nebulization every 6 (six) hours as needed for wheezing.  75 mL  12  . albuterol (VENTOLIN HFA) 108 (90 BASE) MCG/ACT inhaler Inhale 2 puffs into the lungs every 6 (six) hours as needed for wheezing.  1 Inhaler  6  . aspirin 81 MG tablet Take 81 mg by mouth daily.        . digoxin (LANOXIN) 0.125 MG tablet Take by mouth daily. Take 1/2 tablet once a day      . furosemide (LASIX) 80 MG tablet TAKE ONE & ONE-HALF (1 & 1/2) TABLETS IN THE MORNING & ONE (1) TABLET IN THE EVENING  75 tablet  0  . gabapentin (NEURONTIN) 300 MG capsule Take 4 tablets by mouth in am, 2 tablets at noon, and 4 tablets at night      . lovastatin (MEVACOR) 20 MG tablet Take 20 mg by mouth 2 (two) times daily.      . metFORMIN (GLUCOPHAGE) 500 MG tablet Take 500 mg by mouth 2 (two) times daily.       . metoprolol (LOPRESSOR) 50 MG tablet Take 25 mg by mouth 2 (two) times daily.      . Multiple Vitamins-Minerals (CENTRUM PO) Take 1 tablet by mouth daily.        Marland Kitchen NITROSTAT 0.4 MG SL tablet Place 0.4 mg under the tongue every 5 (five) minutes as needed.       . Omega-3 Fatty Acids (FISH OIL) 1000 MG CAPS Take 1 capsule by mouth daily.         . potassium chloride SA (K-DUR,KLOR-CON) 20 MEQ tablet TAKE TWO TABLETS BY MOUTH TWICE DAILY  120 tablet  3  . predniSONE (DELTASONE) 10 MG tablet Take 1 tablet (10 mg total) by mouth daily.  30 tablet  6  . SPIRIVA HANDIHALER 18 MCG inhalation capsule PLACE ONE CAPSULE IN MOUTHPIECE (HANDIHALER) AND INHALE IN MOUTH ONCE A DAY.  30 capsule  5  . SYMBICORT 160-4.5 MCG/ACT inhaler INHALE 2 PUFFS INTO MOUTH TWICE DAILY  10.2 g  6  . warfarin (COUMADIN) 5 MG tablet Take as directed MANAGED BY LISA       No current facility-administered medications for this visit.    History   Social History  . Marital Status: Married    Spouse Name: N/A    Number of Children: N/A  . Years of Education: N/A   Occupational History  . Dance movement psychotherapist    Social History Main Topics  . Smoking status: Former Smoker -- 2.00 packs/day for 40 years    Types: Cigarettes    Quit date: 01/06/1998  . Smokeless tobacco: Never Used  . Alcohol Use:  No  . Drug Use: No  . Sexual Activity: Not on file   Other Topics Concern  . Not on file   Social History Narrative  . No narrative on file    Family History  Problem Relation Age of Onset  . Heart disease Mother   . Cancer Father     throat    Past Medical History  Diagnosis Date  . COPD with emphysema     chronic Dyspnea, followed by Dr. Dennie Bible. Wright, oxygen at night started 11/2008  . Atrial fibrillation     Permanent  . Warfarin anticoagulation     Coumadin  . Right ventricular dysfunction     Right Ventricle, mild moderately dilated by Echo 06/2008  . CAD (coronary artery disease)     .MI with a Taxus stent in 2004..  /   nuclear... June, 2010.. small scar or slight ischemia inferior wall  . Diabetes mellitus   . Hyperlipemia   . Obesity   . Peripheral edema     Mild  . Hypertension     relative hypotension... August, 2011... Cozaar stopped  . Foot pain     Significant neuropathy has been evaluated at [facility].  .  Ejection fraction     EF 60%, echo, 2010, technically difficult study  . Drug allergy     Plavix causes severe rash  . Pulmonary hypertension     PA pressure 45-50 mmHg, echo, June, 2013, patient hospitalized with a COPD exacerbation at that time.  . Constipation     July, 2013  . Blisters of multiple sites     Patient has blisters with clear liquid. There are 5 or more on the great toe and second toe of the right foot. There is one on the left great toe.  Marland Kitchen PAD (peripheral artery disease)     Vascular surgery consultation September, 2013  . Chronic diastolic CHF (congestive heart failure)     Patient requires diuretics.  . Peripheral arterial disease   . Neuropathy     Diabetic - feet    Past Surgical History  Procedure Laterality Date  . Rotator cuff repair      x 3  . Knee surgery      Patient Active Problem List   Diagnosis Date Noted  . Pain in limb- Bilateral leg and right thumb and index finger 08/13/2012  . Drug intolerance 08/11/2012  . At high risk for falls 05/04/2012  . Pain in joint, ankle and foot 03/04/2012  . Edema of both legs 02/13/2012  . Chronic diastolic CHF (congestive heart failure)   . PAD (peripheral artery disease)   . Venous insufficiency 10/10/2011  . Blisters of multiple sites   . Chronic respiratory failure 07/08/2011  . Ejection fraction   . Pulmonary hypertension   . Drug allergy   . Constipation   . Right ventricular dysfunction   . COPD with emphysema Gold D   . Atrial fibrillation   . Warfarin anticoagulation   . CAD (coronary artery disease)   . Hyperlipemia   . Obesity   . Peripheral edema   . Hypertension   . Foot pain     ROS   Patient denies fever, chills, headache, sweats, rash, change in vision, change in hearing, chest pain, cough, nausea vomiting, urinary symptoms. All other systems are reviewed and are negative other than the history of present illness.  PHYSICAL EXAM  Patient is overweight but is losing weight in  looking  better. He is oriented to person time and place. Affect is normal. There is no jugulovenous distention. Lungs reveal decreased breath sounds. I do not hear wheezes today. Cardiac exam reveals distant heart sounds. Abdomen is protuberant but soft. There is trace peripheral edema. He does not have any active blisters at this time.  Filed Vitals:   11/22/12 1051  BP: 144/80  Pulse: 86  SpO2: 95%   EKG is done today and reviewed by me. There is atrial fibrillation. This is old. They're nonspecific ST-T wave changes.  ASSESSMENT & PLAN

## 2012-11-30 ENCOUNTER — Ambulatory Visit (INDEPENDENT_AMBULATORY_CARE_PROVIDER_SITE_OTHER): Payer: Medicare Other | Admitting: *Deleted

## 2012-11-30 DIAGNOSIS — I4891 Unspecified atrial fibrillation: Secondary | ICD-10-CM

## 2012-11-30 DIAGNOSIS — Z7901 Long term (current) use of anticoagulants: Secondary | ICD-10-CM

## 2012-11-30 LAB — POCT INR: INR: 2.3

## 2012-11-30 MED ORDER — WARFARIN SODIUM 5 MG PO TABS: ORAL_TABLET | ORAL | Status: DC

## 2012-12-13 ENCOUNTER — Other Ambulatory Visit: Payer: Self-pay | Admitting: Cardiology

## 2012-12-20 ENCOUNTER — Other Ambulatory Visit: Payer: Self-pay | Admitting: Cardiology

## 2012-12-28 ENCOUNTER — Ambulatory Visit (INDEPENDENT_AMBULATORY_CARE_PROVIDER_SITE_OTHER): Payer: Medicare Other | Admitting: Pharmacist

## 2012-12-28 DIAGNOSIS — I4891 Unspecified atrial fibrillation: Secondary | ICD-10-CM

## 2012-12-28 DIAGNOSIS — Z7901 Long term (current) use of anticoagulants: Secondary | ICD-10-CM

## 2012-12-28 LAB — POCT INR: INR: 3.3

## 2013-01-04 ENCOUNTER — Ambulatory Visit (INDEPENDENT_AMBULATORY_CARE_PROVIDER_SITE_OTHER): Payer: Medicare Other | Admitting: *Deleted

## 2013-01-04 DIAGNOSIS — I4891 Unspecified atrial fibrillation: Secondary | ICD-10-CM

## 2013-01-04 DIAGNOSIS — Z7901 Long term (current) use of anticoagulants: Secondary | ICD-10-CM

## 2013-01-04 LAB — POCT INR: INR: 2.3

## 2013-01-10 ENCOUNTER — Other Ambulatory Visit: Payer: Self-pay | Admitting: Cardiology

## 2013-01-14 ENCOUNTER — Ambulatory Visit (INDEPENDENT_AMBULATORY_CARE_PROVIDER_SITE_OTHER): Payer: Medicare Other | Admitting: *Deleted

## 2013-01-14 DIAGNOSIS — Z7901 Long term (current) use of anticoagulants: Secondary | ICD-10-CM

## 2013-01-14 DIAGNOSIS — I4891 Unspecified atrial fibrillation: Secondary | ICD-10-CM

## 2013-01-14 LAB — POCT INR: INR: 2.2

## 2013-02-02 ENCOUNTER — Other Ambulatory Visit: Payer: Self-pay | Admitting: Critical Care Medicine

## 2013-02-04 ENCOUNTER — Ambulatory Visit (INDEPENDENT_AMBULATORY_CARE_PROVIDER_SITE_OTHER): Payer: Medicare Other | Admitting: *Deleted

## 2013-02-04 DIAGNOSIS — I4891 Unspecified atrial fibrillation: Secondary | ICD-10-CM

## 2013-02-04 DIAGNOSIS — Z7901 Long term (current) use of anticoagulants: Secondary | ICD-10-CM

## 2013-02-04 DIAGNOSIS — Z5181 Encounter for therapeutic drug level monitoring: Secondary | ICD-10-CM | POA: Insufficient documentation

## 2013-02-04 LAB — POCT INR: INR: 1.4

## 2013-02-18 ENCOUNTER — Ambulatory Visit (INDEPENDENT_AMBULATORY_CARE_PROVIDER_SITE_OTHER): Payer: Medicare Other | Admitting: *Deleted

## 2013-02-18 DIAGNOSIS — I4891 Unspecified atrial fibrillation: Secondary | ICD-10-CM

## 2013-02-18 DIAGNOSIS — Z5181 Encounter for therapeutic drug level monitoring: Secondary | ICD-10-CM

## 2013-02-18 DIAGNOSIS — Z7901 Long term (current) use of anticoagulants: Secondary | ICD-10-CM

## 2013-02-18 LAB — POCT INR: INR: 1.4

## 2013-02-21 ENCOUNTER — Ambulatory Visit (INDEPENDENT_AMBULATORY_CARE_PROVIDER_SITE_OTHER): Payer: Medicare Other | Admitting: Cardiology

## 2013-02-21 ENCOUNTER — Encounter: Payer: Self-pay | Admitting: Cardiology

## 2013-02-21 VITALS — BP 107/74 | HR 94 | Ht 69.0 in | Wt 225.0 lb

## 2013-02-21 DIAGNOSIS — J439 Emphysema, unspecified: Secondary | ICD-10-CM

## 2013-02-21 DIAGNOSIS — Z7901 Long term (current) use of anticoagulants: Secondary | ICD-10-CM

## 2013-02-21 DIAGNOSIS — I5032 Chronic diastolic (congestive) heart failure: Secondary | ICD-10-CM

## 2013-02-21 DIAGNOSIS — J438 Other emphysema: Secondary | ICD-10-CM

## 2013-02-21 DIAGNOSIS — I509 Heart failure, unspecified: Secondary | ICD-10-CM

## 2013-02-21 DIAGNOSIS — I4891 Unspecified atrial fibrillation: Secondary | ICD-10-CM

## 2013-02-21 MED ORDER — DIGOXIN 125 MCG PO TABS: 0.0625 mg | ORAL_TABLET | Freq: Every day | ORAL | Status: DC

## 2013-02-21 NOTE — Patient Instructions (Signed)
Your physician recommends that you schedule a follow-up appointment in: 3 months. Your physician recommends that you continue on your current medications as directed. Please refer to the Current Medication list given to you today. 

## 2013-02-21 NOTE — Assessment & Plan Note (Signed)
His volume status is under control. No change in therapy.

## 2013-02-21 NOTE — Assessment & Plan Note (Signed)
The patient has severe pulmonary disease. His ambulation is very limited. He asked me if I thought he would be a candidate for an Art gallery managerelectric scooter. It does appear to me that his multiple illnesses would qualify him for a scooter. He tries very hard to get around on his own. He is not able to go everywhere he wants with a wheelchair.

## 2013-02-21 NOTE — Assessment & Plan Note (Signed)
Coumadin is to be continued. 

## 2013-02-21 NOTE — Progress Notes (Signed)
HPI  Patient is seen today to followup atrial fibrillation and chronic diastolic CHF. Fortunately his overall cardiac status has remained stable. He is very limited by his multiple diseases. His ambulation is very limited. He has severe pulmonary disease. His atrial fibrillation rate is under reasonable control. He is anticoagulated. He does not have significant edema.  Allergies  Allergen Reactions  . Clopidogrel Bisulfate     REACTION: rash    Current Outpatient Prescriptions  Medication Sig Dispense Refill  . albuterol (PROVENTIL) (2.5 MG/3ML) 0.083% nebulizer solution Take 3 mLs (2.5 mg total) by nebulization every 6 (six) hours as needed for wheezing.  75 mL  12  . albuterol (VENTOLIN HFA) 108 (90 BASE) MCG/ACT inhaler Inhale 2 puffs into the lungs every 6 (six) hours as needed for wheezing.  1 Inhaler  6  . aspirin 81 MG tablet Take 81 mg by mouth daily.        . digoxin (DIGOX) 0.125 MG tablet Take 0.5 tablets (0.0625 mg total) by mouth daily.  45 tablet  0  . furosemide (LASIX) 80 MG tablet TAKE ONE & ONE-HALF (1 & 1/2) TABLETS BY MOUTH IN THE MORNING AND TAKE ONE TABLET IN THE EVENING  75 tablet  1  . gabapentin (NEURONTIN) 300 MG capsule Take 4 tablets by mouth in am, 2 tablets at noon, and 4 tablets at night      . lovastatin (MEVACOR) 20 MG tablet Take 20 mg by mouth 2 (two) times daily.      . metFORMIN (GLUCOPHAGE) 500 MG tablet Take 500 mg by mouth 2 (two) times daily.       . metoprolol (LOPRESSOR) 50 MG tablet Take 25 mg by mouth 2 (two) times daily.      . Multiple Vitamins-Minerals (CENTRUM PO) Take 1 tablet by mouth daily.        Marland Kitchen NITROSTAT 0.4 MG SL tablet Place 0.4 mg under the tongue every 5 (five) minutes as needed.       . Omega-3 Fatty Acids (FISH OIL) 1000 MG CAPS Take 1 capsule by mouth daily.        . potassium chloride SA (K-DUR,KLOR-CON) 20 MEQ tablet TAKE TWO TABLETS BY MOUTH TWICE DAILY  120 tablet  3  . predniSONE (DELTASONE) 10 MG tablet Take 1 tablet  (10 mg total) by mouth daily.  30 tablet  6  . SPIRIVA HANDIHALER 18 MCG inhalation capsule PLACE ONE CAPSULE IN MOUTHPIECE (HANDIHALER) AND INHALE IN MOUTH ONCE A DAY.  30 capsule  6  . SYMBICORT 160-4.5 MCG/ACT inhaler INHALE 2 PUFFS INTO MOUTH TWICE DAILY  10.2 g  6  . warfarin (COUMADIN) 5 MG tablet Take 1 tablet daily except 1 1/2 tablets on Tuesdays, Thursdays and Saturdays or as directed MANAGED BY LISA  45 tablet  3   No current facility-administered medications for this visit.    History   Social History  . Marital Status: Married    Spouse Name: N/A    Number of Children: N/A  . Years of Education: N/A   Occupational History  . Dance movement psychotherapist    Social History Main Topics  . Smoking status: Former Smoker -- 2.00 packs/day for 40 years    Types: Cigarettes    Quit date: 01/06/1998  . Smokeless tobacco: Never Used  . Alcohol Use: No  . Drug Use: No  . Sexual Activity: Not on file   Other Topics Concern  . Not on file   Social  History Narrative  . No narrative on file    Family History  Problem Relation Age of Onset  . Heart disease Mother   . Cancer Father     throat    Past Medical History  Diagnosis Date  . COPD with emphysema     chronic Dyspnea, followed by Dr. Dennie BiblePat. Wright, oxygen at night started 11/2008  . Atrial fibrillation     Permanent  . Warfarin anticoagulation     Coumadin  . Right ventricular dysfunction     Right Ventricle, mild moderately dilated by Echo 06/2008  . CAD (coronary artery disease)     .MI with a Taxus stent in 2004..  /   nuclear... June, 2010.. small scar or slight ischemia inferior wall  . Diabetes mellitus   . Hyperlipemia   . Obesity   . Peripheral edema     Mild  . Hypertension     relative hypotension... August, 2011... Cozaar stopped  . Foot pain     Significant [facility].  . Ejection fraction     EF 60%, echo, 2010, technically difficult study  . Drug  allergy     Plavix causes severe rash  . Pulmonary hypertension     PA pressure 45-50 mmHg, echo, June, 2013, patient hospitalized with a COPD exacerbation at that time.  . Constipation     July, 2013  . Blisters of multiple sites     Patient has blisters with clear liquid. There are 5 or more on the great toe and second toe of the right foot. There is one on the left great toe.  Marland Kitchen. PAD (peripheral artery disease)     Vascular surgery consultation September, 2013  . Chronic diastolic CHF (congestive heart failure)     Patient requires diuretics.  . Peripheral arterial disease   . Neuropathy     Diabetic - feet    Past Surgical History  Procedure Laterality Date  . Rotator cuff repair      x 3  . Knee surgery      Patient Active Problem List   Diagnosis Date Noted  . Encounter for therapeutic drug monitoring 02/04/2013  . Pain in limb- Bilateral leg and right thumb and index finger 08/13/2012  . Drug intolerance 08/11/2012  . At high risk for falls 05/04/2012  . Pain in joint, ankle and foot 03/04/2012  . Edema of both legs 02/13/2012  . Chronic diastolic CHF (congestive heart failure)   . PAD (peripheral artery disease)   . Venous insufficiency 10/10/2011  . Blisters of multiple sites   . Chronic respiratory failure 07/08/2011  . Ejection fraction   . Pulmonary hypertension   . Drug allergy   . Constipation   . Right ventricular dysfunction   . COPD with emphysema Gold D   . Atrial fibrillation   . Warfarin anticoagulation   . CAD (coronary artery disease)   . Hyperlipemia   . Obesity   . Peripheral edema   . Hypertension   . Foot pain     ROS   Patient denies fever, chills, headache, sweats, rash, change in vision, change in hearing, chest pain, nausea or vomiting, urinary symptoms. All other systems are reviewed and are negative.  PHYSICAL EXAM  Patient is overweight but stable. He is oriented to person time and place. Affect is normal. Lungs reveal a few  end-expiratory wheezes. There are decreased breath sounds. There is no respiratory distress. Cardiac exam  reveals distant heart sounds. The abdomen is soft. There is no significant peripheral edema area he has a healing blister on the fourth digit of his right hand. He had a blister on the fifth digit that began to resolve spontaneously. We have not been able to fully diagnose these blisters. He'll be seen at [facility] in the next few days, weather permitting. He has no significant peripheral edema.  Filed Vitals:   02/21/13 0816  BP: 107/74  Pulse: 94  Height: 5\' 9"  (1.753 m)  Weight: 225 lb (102.059 kg)     ASSESSMENT & PLAN

## 2013-02-21 NOTE — Assessment & Plan Note (Signed)
Atrial fibrillation is controlled. No change in therapy. 

## 2013-02-23 ENCOUNTER — Other Ambulatory Visit: Payer: Self-pay | Admitting: Vascular Surgery

## 2013-02-23 DIAGNOSIS — L139 Bullous disorder, unspecified: Secondary | ICD-10-CM | POA: Insufficient documentation

## 2013-02-23 DIAGNOSIS — I739 Peripheral vascular disease, unspecified: Secondary | ICD-10-CM

## 2013-02-23 DIAGNOSIS — M79609 Pain in unspecified limb: Secondary | ICD-10-CM

## 2013-02-25 ENCOUNTER — Ambulatory Visit (INDEPENDENT_AMBULATORY_CARE_PROVIDER_SITE_OTHER): Payer: Medicare Other | Admitting: *Deleted

## 2013-02-25 DIAGNOSIS — I4891 Unspecified atrial fibrillation: Secondary | ICD-10-CM

## 2013-02-25 DIAGNOSIS — Z7901 Long term (current) use of anticoagulants: Secondary | ICD-10-CM

## 2013-02-25 DIAGNOSIS — Z5181 Encounter for therapeutic drug level monitoring: Secondary | ICD-10-CM

## 2013-02-25 LAB — POCT INR: INR: 2.3

## 2013-03-14 ENCOUNTER — Other Ambulatory Visit: Payer: Self-pay | Admitting: Cardiology

## 2013-03-15 ENCOUNTER — Ambulatory Visit (INDEPENDENT_AMBULATORY_CARE_PROVIDER_SITE_OTHER): Payer: Medicare Other | Admitting: *Deleted

## 2013-03-15 DIAGNOSIS — I4891 Unspecified atrial fibrillation: Secondary | ICD-10-CM

## 2013-03-15 DIAGNOSIS — Z5181 Encounter for therapeutic drug level monitoring: Secondary | ICD-10-CM

## 2013-03-15 DIAGNOSIS — Z7901 Long term (current) use of anticoagulants: Secondary | ICD-10-CM

## 2013-03-15 LAB — POCT INR: INR: 2.3

## 2013-03-29 ENCOUNTER — Other Ambulatory Visit: Payer: Self-pay | Admitting: Critical Care Medicine

## 2013-04-12 ENCOUNTER — Ambulatory Visit (INDEPENDENT_AMBULATORY_CARE_PROVIDER_SITE_OTHER): Payer: Medicare Other | Admitting: *Deleted

## 2013-04-12 DIAGNOSIS — I4891 Unspecified atrial fibrillation: Secondary | ICD-10-CM

## 2013-04-12 DIAGNOSIS — Z5181 Encounter for therapeutic drug level monitoring: Secondary | ICD-10-CM

## 2013-04-12 DIAGNOSIS — Z7901 Long term (current) use of anticoagulants: Secondary | ICD-10-CM

## 2013-04-12 LAB — POCT INR: INR: 3.6

## 2013-04-27 ENCOUNTER — Encounter: Payer: Self-pay | Admitting: Critical Care Medicine

## 2013-04-27 ENCOUNTER — Ambulatory Visit (INDEPENDENT_AMBULATORY_CARE_PROVIDER_SITE_OTHER): Payer: Medicare Other | Admitting: Critical Care Medicine

## 2013-04-27 VITALS — BP 106/70 | HR 85 | Temp 96.8°F | Ht 69.0 in | Wt 216.8 lb

## 2013-04-27 DIAGNOSIS — J438 Other emphysema: Secondary | ICD-10-CM

## 2013-04-27 DIAGNOSIS — J439 Emphysema, unspecified: Secondary | ICD-10-CM

## 2013-04-27 DIAGNOSIS — E669 Obesity, unspecified: Secondary | ICD-10-CM

## 2013-04-27 NOTE — Patient Instructions (Signed)
Great work on your weight loss No change in medications Return 4 months

## 2013-04-27 NOTE — Progress Notes (Signed)
Subjective:    Patient ID: [name], male    DOB: 04/04/1940, 73 y.o.   MRN: [id number]  HPI  [name] is a 73 y.o. male with history of RV dysfunction, COPD Gold D, emphysema, A fib, CAD, DM 2, pulm HTN.   04/27/2013 Chief Complaint  Patient presents with  . 4 month follow up    Breathing is unchanged.  Has wheezing with activity and prod cough with white mucus.  No chest tightness/pain.   No issues over the winter. Has lost 60# intentionally Pt denies any significant sore throat, nasal congestion or excess secretions, fever, chills, sweats, unintended weight loss, pleurtic or exertional chest pain, orthopnea PND, or leg swelling Pt denies any increase in rescue therapy over baseline, denies waking up needing it or having any early am or nocturnal exacerbations of coughing/wheezing/or dyspnea. Pt also denies any obvious fluctuation in symptoms with  weather or environmental change or other alleviating or aggravating factors    Review of Systems  Constitutional: Positive for fatigue. Negative for fever, chills and diaphoresis.  HENT: Negative for congestion, postnasal drip, rhinorrhea and sneezing.   Respiratory: Positive for cough and shortness of breath. Negative for choking, chest tightness and wheezing.       Objective:   Physical Exam   VITAL SIGNS:  BP 106/70  Pulse 85  Temp(Src) 96.8 F (36 C) (Oral)  Ht 5\' 9"  (1.753 m)  Wt 216 lb 12.8 oz (98.34 kg)  BMI 32.00 kg/m2  SpO2 98%   HEENT: Head is normocephalic and atraumatic. Extraocular muscles are intact. Pupils are equal, round, and reactive to light. Turbinates are not inflamed, no polyps observed.  Nares appeared normal. Mouth is well hydrated and without lesions. Mucous membranes are moist. Posterior pharynx clear of any exudate or lesions. NECK: Supple. No carotid bruits. No lymphadenopathy or thyromegaly. LUNGS: Clear to auscultation. HEART: Regular rate and rhythm without murmur.  ABDOMEN: Soft,  nontender, and nondistended. Positive bowel sounds. No hepatosplenomegaly was noted.  EXTREMITIES: Without any cyanosis, clubbing, rash, lesions or edema. Has bruising on arms.  NEUROLOGIC: Cranial nerves II through XII are grossly intact.  SKIN: No ulceration or induration present.       Assessment & Plan:   Obesity Markedly better, has lost 60#!!!!  COPD with emphysema Gold D Gold D copd but improved with weight loss (!60#) Plan No change in inhaled or maintenance medications. Return in  4 months    Updated Medication List Outpatient Encounter Prescriptions as of 04/27/2013  Medication Sig  . albuterol (PROVENTIL) (2.5 MG/3ML) 0.083% nebulizer solution Take 3 mLs (2.5 mg total) by nebulization every 6 (six) hours as needed for wheezing.  Marland Kitchen. albuterol (VENTOLIN HFA) 108 (90 BASE) MCG/ACT inhaler Inhale 2 puffs into the lungs every 6 (six) hours as needed for wheezing.  Marland Kitchen. aspirin 81 MG tablet Take 81 mg by mouth daily.    . digoxin (DIGOX) 0.125 MG tablet Take 0.5 tablets (0.0625 mg total) by mouth daily.  . furosemide (LASIX) 80 MG tablet TAKE 1 AND 1/2 TABLETS BY MOUTH IN THE MORNING AND ONE TABLET IN THE EVENING  . gabapentin (NEURONTIN) 300 MG capsule Take 4 tablets by mouth in am, 2 tablets at noon, and 4 tablets at night  . lovastatin (MEVACOR) 20 MG tablet Take 20 mg by mouth 2 (two) times daily.  . metFORMIN (GLUCOPHAGE) 500 MG tablet Take 500 mg by mouth 2 (two) times daily.   . metoprolol (LOPRESSOR) 50 MG tablet Take  25 mg by mouth 2 (two) times daily.  . Multiple Vitamins-Minerals (CENTRUM PO) Take 1 tablet by mouth daily.    Marland Kitchen. NITROSTAT 0.4 MG SL tablet Place 0.4 mg under the tongue every 5 (five) minutes as needed.   . potassium chloride SA (K-DUR,KLOR-CON) 20 MEQ tablet TAKE TWO (2) TABLETS BY MOUTH TWICE DAILY  . predniSONE (DELTASONE) 10 MG tablet TAKE ONE TABLET BY MOUTH ONCE DAILY  . SPIRIVA HANDIHALER 18 MCG inhalation capsule PLACE ONE CAPSULE IN MOUTHPIECE  (HANDIHALER) AND INHALE IN MOUTH ONCE A DAY.  . SYMBICORT 160-4.5 MCG/ACT inhaler INHALE 2 PUFFS INTO MOUTH TWICE DAILY  . warfarin (COUMADIN) 5 MG tablet Take 1 tablet daily except 1 1/2 tablets on Tuesdays, Thursdays and Saturdays or as directed MANAGED BY LISA  . [DISCONTINUED] Omega-3 Fatty Acids (FISH OIL) 1000 MG CAPS Take 1 capsule by mouth daily.

## 2013-04-27 NOTE — Assessment & Plan Note (Signed)
Markedly better, has lost 60#!!!!

## 2013-04-28 NOTE — Assessment & Plan Note (Signed)
Gold D copd but improved with weight loss (!60#) Plan No change in inhaled or maintenance medications. Return in  4 months

## 2013-05-02 ENCOUNTER — Other Ambulatory Visit: Payer: Self-pay | Admitting: Cardiology

## 2013-05-03 ENCOUNTER — Ambulatory Visit (INDEPENDENT_AMBULATORY_CARE_PROVIDER_SITE_OTHER): Payer: Medicare Other | Admitting: *Deleted

## 2013-05-03 DIAGNOSIS — I4891 Unspecified atrial fibrillation: Secondary | ICD-10-CM

## 2013-05-03 DIAGNOSIS — Z5181 Encounter for therapeutic drug level monitoring: Secondary | ICD-10-CM

## 2013-05-03 DIAGNOSIS — Z7901 Long term (current) use of anticoagulants: Secondary | ICD-10-CM

## 2013-05-03 LAB — POCT INR: INR: 3.7

## 2013-05-04 ENCOUNTER — Encounter: Payer: Self-pay | Admitting: Cardiology

## 2013-05-04 ENCOUNTER — Ambulatory Visit (INDEPENDENT_AMBULATORY_CARE_PROVIDER_SITE_OTHER): Payer: Medicare Other | Admitting: Cardiology

## 2013-05-04 VITALS — BP 146/80 | HR 92 | Ht 69.0 in | Wt 210.1 lb

## 2013-05-04 DIAGNOSIS — J438 Other emphysema: Secondary | ICD-10-CM

## 2013-05-04 DIAGNOSIS — I4891 Unspecified atrial fibrillation: Secondary | ICD-10-CM

## 2013-05-04 DIAGNOSIS — I5032 Chronic diastolic (congestive) heart failure: Secondary | ICD-10-CM

## 2013-05-04 DIAGNOSIS — R600 Localized edema: Secondary | ICD-10-CM

## 2013-05-04 DIAGNOSIS — J439 Emphysema, unspecified: Secondary | ICD-10-CM

## 2013-05-04 DIAGNOSIS — I509 Heart failure, unspecified: Secondary | ICD-10-CM

## 2013-05-04 DIAGNOSIS — R609 Edema, unspecified: Secondary | ICD-10-CM

## 2013-05-04 NOTE — Assessment & Plan Note (Signed)
Overall he is as stable as I have seen him a long time. He is very appreciative of the care he receives from Dr. Delford FieldWright.

## 2013-05-04 NOTE — Progress Notes (Signed)
Patient ID: [name], male   DOB: 02/24/1940, 73 y.o.   MRN: [id number]    HPI  The patient looks great today. He brought me a note from Dr.Wright showing how pleased he was at the patient had lost weight.. he really looks good. He has severe disease but he is more stable than usual. He hopes to continue to lose some weight. [facility] was [facility] for the evaluation of his skin lesions. I do not have specific data. He tells me that they felt there was nothing that they can offer.  Allergies  Allergen Reactions  . Clopidogrel Bisulfate     REACTION: rash    Current Outpatient Prescriptions  Medication Sig Dispense Refill  . albuterol (PROVENTIL) (2.5 MG/3ML) 0.083% nebulizer solution Take 3 mLs (2.5 mg total) by nebulization every 6 (six) hours as needed for wheezing.  75 mL  12  . albuterol (VENTOLIN HFA) 108 (90 BASE) MCG/ACT inhaler Inhale 2 puffs into the lungs every 6 (six) hours as needed for wheezing.  1 Inhaler  6  . aspirin 81 MG tablet Take 81 mg by mouth daily.        . digoxin (DIGOX) 0.125 MG tablet Take 0.5 tablets (0.0625 mg total) by mouth daily.  45 tablet  3  . furosemide (LASIX) 80 MG tablet TAKE 1 AND 1/2 TABLETS BY MOUTH IN THE MORNING AND ONE TABLET IN THE EVENING  75 tablet  3  . gabapentin (NEURONTIN) 300 MG capsule Take 4 tablets by mouth in am, 2 tablets at noon, and 4 tablets at night      . lovastatin (MEVACOR) 20 MG tablet Take 20 mg by mouth 2 (two) times daily.      . metFORMIN (GLUCOPHAGE) 500 MG tablet Take 500 mg by mouth 2 (two) times daily.       . metoprolol (LOPRESSOR) 50 MG tablet Take 25 mg by mouth 2 (two) times daily.      . Multiple Vitamins-Minerals (CENTRUM PO) Take 1 tablet by mouth daily.        Marland Kitchen. NITROSTAT 0.4 MG SL tablet Place 0.4 mg under the tongue every 5 (five) minutes as needed.       . potassium chloride SA (K-DUR,KLOR-CON) 20 MEQ tablet TAKE TWO (2) TABLETS BY MOUTH TWICE DAILY  120 tablet  3  . predniSONE (DELTASONE) 10 MG tablet  TAKE ONE TABLET BY MOUTH ONCE DAILY  30 tablet  6  . SPIRIVA HANDIHALER 18 MCG inhalation capsule PLACE ONE CAPSULE IN MOUTHPIECE (HANDIHALER) AND INHALE IN MOUTH ONCE A DAY.  30 capsule  6  . SYMBICORT 160-4.5 MCG/ACT inhaler INHALE 2 PUFFS INTO MOUTH TWICE DAILY  10.2 g  6  . warfarin (COUMADIN) 5 MG tablet TAKE ONE TABLET BY MOUTH DAILY ON SUNDAY, MONDAY, WEDNESDAY, AND FRIDAY, AND TAKE 1 1/2 TABLETS ON TUESDAY, THURSDAY, AND SATURDAY  45 tablet  3   No current facility-administered medications for this visit.    History   Social History  . Marital Status: Married    Spouse Name: N/A    Number of Children: N/A  . Years of Education: N/A   Occupational History  . Dance movement psychotherapisthighway worker    Social History Main Topics  . Smoking status: Former Smoker -- 2.00 packs/day for 40 years    Types: Cigarettes    Quit date: 01/06/1998  . Smokeless tobacco: Never Used  . Alcohol Use: No  . Drug Use: No  . Sexual Activity: Not  on file   Other Topics Concern  . Not on file   Social History Narrative  . No narrative on file    Family History  Problem Relation Age of Onset  . Heart disease Mother   . Cancer Father     throat    Past Medical History  Diagnosis Date  . COPD with emphysema     chronic Dyspnea, followed by Dr. Dennie BiblePat. Wright, oxygen at night started 11/2008  . Atrial fibrillation     Permanent  . Warfarin anticoagulation     Coumadin  . Right ventricular dysfunction     Right Ventricle, mild moderately dilated by Echo 06/2008  . CAD (coronary artery disease)     .MI with a Taxus stent in 2004..  /   nuclear... June, 2010.. small scar or slight ischemia inferior wall  . Diabetes mellitus   . Hyperlipemia   . Obesity   . Peripheral edema     Mild  . Hypertension     relative hypotension... August, 2011... Cozaar stopped  . Foot pain     Significant [facility].  . Ejection fraction     EF 60%, echo, 2010, technically  difficult study  . Drug allergy     Plavix causes severe rash  . Pulmonary hypertension     PA pressure 45-50 mmHg, echo, June, 2013, patient hospitalized with a COPD exacerbation at that time.  . Constipation     July, 2013  . Blisters of multiple sites     Patient has blisters with clear liquid. There are 5 or more on the great toe and second toe of the right foot. There is one on the left great toe.  Marland Kitchen. PAD (peripheral artery disease)     Vascular surgery consultation September, 2013  . Chronic diastolic CHF (congestive heart failure)     Patient requires diuretics.  . Peripheral arterial disease   . Neuropathy     Diabetic - feet    Past Surgical History  Procedure Laterality Date  . Rotator cuff repair      x 3  . Knee surgery      Patient Active Problem List   Diagnosis Date Noted  . Encounter for therapeutic drug monitoring 02/04/2013  . Pain in limb- Bilateral leg and right thumb and index finger 08/13/2012  . Drug intolerance 08/11/2012  . At high risk for falls 05/04/2012  . Pain in joint, ankle and foot 03/04/2012  . Edema of both legs 02/13/2012  . Chronic diastolic CHF (congestive heart failure)   . PAD (peripheral artery disease)   . Venous insufficiency 10/10/2011  . Blisters of multiple sites   . Chronic respiratory failure 07/08/2011  . Ejection fraction   . Pulmonary hypertension   . Drug allergy   . Constipation   . Right ventricular dysfunction   . COPD with emphysema Gold D   . Atrial fibrillation   . Warfarin anticoagulation   . CAD (coronary artery disease)   . Hyperlipemia   . Obesity   . Peripheral edema   . Hypertension   . Foot pain     ROS   Patient denies fever, chills, headache, sweats, rash, change in vision, change in hearing, chest pain, nausea vomiting, urinary symptoms. All other systems are reviewed and are negative.  PHYSICAL EXAM  Patient is overweight but he is losing weight and he looks much better. Head is atraumatic.  Sclera and conjunctiva are  normal. There is no jugulovenous distention. Lung exam reveals marked decrease in breath sounds. However I do not hear his usual rhonchi and wheezes today. Cardiac exam reveals S1 and S2. The abdomen is protuberant but he improved. He has no significant peripheral edema today. This also is a significant improvement. He walks with a cane.  Filed Vitals:   05/04/13 0906  BP: 146/80  Pulse: 92  Height: 5\' 9"  (1.753 m)  Weight: 210 lb 1.9 oz (95.31 kg)  SpO2: 95%     ASSESSMENT & PLAN

## 2013-05-04 NOTE — Assessment & Plan Note (Signed)
Atrial fibrillation rate is controlled. No change in therapy. 

## 2013-05-04 NOTE — Patient Instructions (Signed)
Your physician recommends that you schedule a follow-up appointment in: 4 months.  Your physician recommends that you continue on your current medications as directed. Please refer to the Current Medication list given to you today.  

## 2013-05-04 NOTE — Assessment & Plan Note (Signed)
His edema is greatly improved. No change in therapy.

## 2013-05-04 NOTE — Assessment & Plan Note (Signed)
His volume status is quite stable. No change in therapy. 

## 2013-05-17 ENCOUNTER — Ambulatory Visit (INDEPENDENT_AMBULATORY_CARE_PROVIDER_SITE_OTHER): Payer: Medicare Other | Admitting: *Deleted

## 2013-05-17 DIAGNOSIS — I4891 Unspecified atrial fibrillation: Secondary | ICD-10-CM

## 2013-05-17 DIAGNOSIS — Z7901 Long term (current) use of anticoagulants: Secondary | ICD-10-CM

## 2013-05-17 DIAGNOSIS — Z5181 Encounter for therapeutic drug level monitoring: Secondary | ICD-10-CM

## 2013-05-17 LAB — POCT INR: INR: 2.9

## 2013-05-19 ENCOUNTER — Other Ambulatory Visit: Payer: Self-pay | Admitting: Critical Care Medicine

## 2013-05-27 ENCOUNTER — Ambulatory Visit (INDEPENDENT_AMBULATORY_CARE_PROVIDER_SITE_OTHER): Payer: Medicare Other | Admitting: *Deleted

## 2013-05-27 DIAGNOSIS — Z7901 Long term (current) use of anticoagulants: Secondary | ICD-10-CM

## 2013-05-27 DIAGNOSIS — I4891 Unspecified atrial fibrillation: Secondary | ICD-10-CM

## 2013-05-27 DIAGNOSIS — Z5181 Encounter for therapeutic drug level monitoring: Secondary | ICD-10-CM

## 2013-05-27 LAB — POCT INR: INR: 2.2

## 2013-06-10 ENCOUNTER — Ambulatory Visit (INDEPENDENT_AMBULATORY_CARE_PROVIDER_SITE_OTHER): Payer: Medicare Other | Admitting: *Deleted

## 2013-06-10 DIAGNOSIS — I4891 Unspecified atrial fibrillation: Secondary | ICD-10-CM

## 2013-06-10 DIAGNOSIS — Z5181 Encounter for therapeutic drug level monitoring: Secondary | ICD-10-CM

## 2013-06-10 DIAGNOSIS — Z7901 Long term (current) use of anticoagulants: Secondary | ICD-10-CM

## 2013-06-10 LAB — POCT INR: INR: 3.2

## 2013-07-01 ENCOUNTER — Ambulatory Visit (INDEPENDENT_AMBULATORY_CARE_PROVIDER_SITE_OTHER): Payer: Medicare Other | Admitting: *Deleted

## 2013-07-01 DIAGNOSIS — Z5181 Encounter for therapeutic drug level monitoring: Secondary | ICD-10-CM

## 2013-07-01 DIAGNOSIS — I4891 Unspecified atrial fibrillation: Secondary | ICD-10-CM

## 2013-07-01 DIAGNOSIS — Z7901 Long term (current) use of anticoagulants: Secondary | ICD-10-CM

## 2013-07-01 LAB — POCT INR: INR: 2.4

## 2013-07-15 ENCOUNTER — Other Ambulatory Visit: Payer: Self-pay | Admitting: Cardiology

## 2013-07-21 ENCOUNTER — Other Ambulatory Visit (HOSPITAL_COMMUNITY): Payer: Self-pay | Admitting: Family Medicine

## 2013-07-21 ENCOUNTER — Other Ambulatory Visit: Payer: Self-pay | Admitting: Family Medicine

## 2013-07-21 DIAGNOSIS — M25512 Pain in left shoulder: Secondary | ICD-10-CM

## 2013-08-01 ENCOUNTER — Other Ambulatory Visit: Payer: Medicare Other

## 2013-08-02 ENCOUNTER — Ambulatory Visit (INDEPENDENT_AMBULATORY_CARE_PROVIDER_SITE_OTHER): Payer: Medicare Other | Admitting: *Deleted

## 2013-08-02 DIAGNOSIS — Z7901 Long term (current) use of anticoagulants: Secondary | ICD-10-CM

## 2013-08-02 DIAGNOSIS — I4891 Unspecified atrial fibrillation: Secondary | ICD-10-CM

## 2013-08-02 DIAGNOSIS — Z5181 Encounter for therapeutic drug level monitoring: Secondary | ICD-10-CM

## 2013-08-02 LAB — POCT INR: INR: 2.2

## 2013-08-08 ENCOUNTER — Ambulatory Visit (INDEPENDENT_AMBULATORY_CARE_PROVIDER_SITE_OTHER): Payer: Medicare Other | Admitting: Cardiology

## 2013-08-08 ENCOUNTER — Encounter: Payer: Self-pay | Admitting: Cardiology

## 2013-08-08 VITALS — BP 106/68 | HR 77 | Ht 69.0 in | Wt 213.8 lb

## 2013-08-08 DIAGNOSIS — I5032 Chronic diastolic (congestive) heart failure: Secondary | ICD-10-CM

## 2013-08-08 DIAGNOSIS — I4891 Unspecified atrial fibrillation: Secondary | ICD-10-CM

## 2013-08-08 DIAGNOSIS — I509 Heart failure, unspecified: Secondary | ICD-10-CM

## 2013-08-08 DIAGNOSIS — I482 Chronic atrial fibrillation, unspecified: Secondary | ICD-10-CM

## 2013-08-08 NOTE — Patient Instructions (Signed)
Your physician recommends that you schedule a follow-up appointment in: 4 months. You will receive a reminder letter in the mail in about 2 months reminding you to call and schedule your appointment. If you don't receive this letter, please contact our office. Your physician recommends that you continue on your current medications as directed. Please refer to the Current Medication list given to you today. 

## 2013-08-08 NOTE — Assessment & Plan Note (Signed)
Atrial flutter rate is controlled. He is on Coumadin. No change in therapy.

## 2013-08-08 NOTE — Progress Notes (Signed)
Patient ID: Peter IdeJames C Bralley, male   DOB: 05/17/1940, 73 y.o.   MRN: [id number]    HPI  Patient returns today to followup his cardiac status. He really is stable. He had lost weight when I saw him last. He is picked up a few pounds. His volume status has been relatively stable.  Allergies  Allergen Reactions  . Clopidogrel Bisulfate     REACTION: rash    Current Outpatient Prescriptions  Medication Sig Dispense Refill  . albuterol (PROVENTIL) (2.5 MG/3ML) 0.083% nebulizer solution Take 3 mLs (2.5 mg total) by nebulization every 6 (six) hours as needed for wheezing.  75 mL  12  . albuterol (VENTOLIN HFA) 108 (90 BASE) MCG/ACT inhaler Inhale 2 puffs into the lungs every 6 (six) hours as needed for wheezing.  1 Inhaler  6  . aspirin 81 MG tablet Take 81 mg by mouth daily.        . digoxin (DIGOX) 0.125 MG tablet Take 0.5 tablets (0.0625 mg total) by mouth daily.  45 tablet  3  . furosemide (LASIX) 80 MG tablet TAKE ONE & ONE-HALF (1 & 1/2) TABLETS BY MOUTH IN THE MORNING AND TAKE ONE TABLET IN THE EVENING  75 tablet  4  . gabapentin (NEURONTIN) 300 MG capsule Take 4 tablets by mouth in am, 2 tablets at noon, and 4 tablets at night      . lovastatin (MEVACOR) 20 MG tablet Take 20 mg by mouth 2 (two) times daily.      . metFORMIN (GLUCOPHAGE) 500 MG tablet Take 500 mg by mouth 2 (two) times daily.       . metoprolol (LOPRESSOR) 50 MG tablet Take 25 mg by mouth 2 (two) times daily.      . Multiple Vitamins-Minerals (CENTRUM PO) Take 1 tablet by mouth daily.        Marland Kitchen. NITROSTAT 0.4 MG SL tablet Place 0.4 mg under the tongue every 5 (five) minutes as needed.       . potassium chloride SA (K-DUR,KLOR-CON) 20 MEQ tablet TAKE TWO (2) TABLETS BY MOUTH TWICE DAILY  120 tablet  4  . predniSONE (DELTASONE) 10 MG tablet TAKE ONE TABLET BY MOUTH ONCE DAILY  30 tablet  6  . SPIRIVA HANDIHALER 18 MCG inhalation capsule PLACE ONE CAPSULE IN MOUTHPIECE (HANDIHALER) AND INHALE IN MOUTH ONCE A DAY.  30 capsule  6    . SYMBICORT 160-4.5 MCG/ACT inhaler INHALE 2 PUFFS INTO MOUTH TWICE DAILY  1 Inhaler  6  . warfarin (COUMADIN) 5 MG tablet TAKE ONE TABLET BY MOUTH DAILY ON SUNDAY, MONDAY, WEDNESDAY, AND FRIDAY, AND TAKE 1 1/2 TABLETS ON TUESDAY, THURSDAY, AND SATURDAY  45 tablet  3   No current facility-administered medications for this visit.    History   Social History  . Marital Status: Married    Spouse Name: N/A    Number of Children: N/A  . Years of Education: N/A   Occupational History  . Dance movement psychotherapisthighway worker    Social History Main Topics  . Smoking status: Former Smoker -- 2.00 packs/day for 40 years    Types: Cigarettes    Quit date: 01/06/1998  . Smokeless tobacco: Never Used  . Alcohol Use: No  . Drug Use: No  . Sexual Activity: Not on file   Other Topics Concern  . Not on file   Social History Narrative  . No narrative on file    Family History  Problem Relation Age of Onset  . Heart  disease Mother   . Cancer Father     throat    Past Medical History  Diagnosis Date  . COPD with emphysema     chronic Dyspnea, followed by Dr. Dennie Bible. Wright, oxygen at night started 11/2008  . Atrial fibrillation     Permanent  . Warfarin anticoagulation     Coumadin  . Right ventricular dysfunction     Right Ventricle, mild moderately dilated by Echo 06/2008  . CAD (coronary artery disease)     .MI with a Taxus stent in 2004..  /   nuclear... June, 2010.. small scar or slight ischemia inferior wall  . Diabetes mellitus   . Hyperlipemia   . Obesity   . Peripheral edema     Mild  . Hypertension     relative hypotension... August, 2011... Cozaar stopped  . Foot pain     Significant neuropathy has been evaluated at [facility].  . Ejection fraction     EF 60%, echo, 2010, technically difficult study  . Drug allergy     Plavix causes severe rash  . Pulmonary hypertension     PA pressure 45-50 mmHg, echo, June, 2013, patient hospitalized with a COPD exacerbation at  that time.  . Constipation     July, 2013  . Blisters of multiple sites     Patient has blisters with clear liquid. There are 5 or more on the great toe and second toe of the right foot. There is one on the left great toe.  Marland Kitchen PAD (peripheral artery disease)     Vascular surgery consultation September, 2013  . Chronic diastolic CHF (congestive heart failure)     Patient requires diuretics.  . Peripheral arterial disease   . Neuropathy     Diabetic - feet    Past Surgical History  Procedure Laterality Date  . Rotator cuff repair      x 3  . Knee surgery      Patient Active Problem List   Diagnosis Date Noted  . Encounter for therapeutic drug monitoring 02/04/2013  . Pain in limb- Bilateral leg and right thumb and index finger 08/13/2012  . Drug intolerance 08/11/2012  . At high risk for falls 05/04/2012  . Pain in joint, ankle and foot 03/04/2012  . Edema of both legs 02/13/2012  . Chronic diastolic CHF (congestive heart failure)   . PAD (peripheral artery disease)   . Venous insufficiency 10/10/2011  . Blisters of multiple sites   . Chronic respiratory failure 07/08/2011  . Ejection fraction   . Pulmonary hypertension   . Drug allergy   . Constipation   . Right ventricular dysfunction   . COPD with emphysema Gold D   . Atrial fibrillation   . Warfarin anticoagulation   . CAD (coronary artery disease)   . Hyperlipemia   . Obesity   . Peripheral edema   . Hypertension   . Foot pain     ROS   Patient denies fever, chills, headache, sweats, rash, change in vision, change in hearing, chest pain, nausea vomiting, urinary symptoms. All other systems are reviewed and are negative.  PHYSICAL EXAM  Patient walks with a cane. He is oriented to person time and place. Affect is normal. There is no jugulovenous distention. Lungs reveal decreased breath sounds. Head is atraumatic. Cardiac exam reveals S1 and S2. Abdomen is soft. There is trace peripheral edema.  Filed Vitals:     08/08/13 0901  BP: 106/68  Pulse: 77  Height: 5\' 9"  (1.753 m)  Weight: 213 lb 12.8 oz (96.979 kg)  SpO2: 95%     ASSESSMENT & PLAN

## 2013-08-08 NOTE — Assessment & Plan Note (Signed)
COPD is severe. He continues to follow with pulmonary.

## 2013-08-08 NOTE — Assessment & Plan Note (Signed)
Volume status is stable. I reminded him to be careful with his salt and fluid intake. No change in therapy.

## 2013-08-19 ENCOUNTER — Encounter (HOSPITAL_COMMUNITY): Payer: Medicare Other

## 2013-08-19 ENCOUNTER — Ambulatory Visit: Payer: Medicare Other | Admitting: Family

## 2013-08-30 ENCOUNTER — Ambulatory Visit (INDEPENDENT_AMBULATORY_CARE_PROVIDER_SITE_OTHER): Payer: Medicare Other | Admitting: *Deleted

## 2013-08-30 DIAGNOSIS — I4891 Unspecified atrial fibrillation: Secondary | ICD-10-CM

## 2013-08-30 DIAGNOSIS — Z7901 Long term (current) use of anticoagulants: Secondary | ICD-10-CM

## 2013-08-30 DIAGNOSIS — Z5181 Encounter for therapeutic drug level monitoring: Secondary | ICD-10-CM

## 2013-08-30 LAB — POCT INR: INR: 2.9

## 2013-09-13 ENCOUNTER — Other Ambulatory Visit: Payer: Self-pay | Admitting: Critical Care Medicine

## 2013-10-04 ENCOUNTER — Ambulatory Visit (INDEPENDENT_AMBULATORY_CARE_PROVIDER_SITE_OTHER): Payer: Medicare Other | Admitting: *Deleted

## 2013-10-04 DIAGNOSIS — Z7901 Long term (current) use of anticoagulants: Secondary | ICD-10-CM

## 2013-10-04 DIAGNOSIS — I4891 Unspecified atrial fibrillation: Secondary | ICD-10-CM

## 2013-10-04 DIAGNOSIS — Z5181 Encounter for therapeutic drug level monitoring: Secondary | ICD-10-CM

## 2013-10-04 LAB — POCT INR: INR: 2.8

## 2013-10-06 ENCOUNTER — Encounter: Payer: Self-pay | Admitting: Cardiology

## 2013-10-07 ENCOUNTER — Ambulatory Visit (INDEPENDENT_AMBULATORY_CARE_PROVIDER_SITE_OTHER): Payer: Medicare Other | Admitting: Diagnostic Neuroimaging

## 2013-10-07 ENCOUNTER — Encounter: Payer: Self-pay | Admitting: Diagnostic Neuroimaging

## 2013-10-07 VITALS — BP 111/54 | HR 87 | Ht 69.0 in | Wt 217.0 lb

## 2013-10-07 DIAGNOSIS — R202 Paresthesia of skin: Secondary | ICD-10-CM

## 2013-10-07 DIAGNOSIS — G609 Hereditary and idiopathic neuropathy, unspecified: Secondary | ICD-10-CM

## 2013-10-07 DIAGNOSIS — R292 Abnormal reflex: Secondary | ICD-10-CM

## 2013-10-07 DIAGNOSIS — R200 Anesthesia of skin: Secondary | ICD-10-CM

## 2013-10-07 DIAGNOSIS — R29898 Other symptoms and signs involving the musculoskeletal system: Secondary | ICD-10-CM

## 2013-10-07 NOTE — Progress Notes (Signed)
[facility]  PATIENT: Peter IdeJames C Cappelletti DOB: 06/04/1940  REFERRING CLINICIAN: Burdine  HISTORY FROM: patient  REASON FOR VISIT: new consult    HISTORICAL  CHIEF COMPLAINT:  Chief Complaint  Patient presents with  . New Evaluation    rm 8  . Gait Problem    HISTORY OF PRESENT ILLNESS:   73 year old male with atrial fibrillation, hypertension, diabetes, hypercholesteremia, COPD, heart failure, neuropathy, coronary artery disease, here for evaluation of gait disorder and foot drop.  For past 10 years patient has had gradual onset, progressive numbness, tingling, burning in his feet. Over past 2 years she has had significant weakness in his feet, ankles and lower extremities. Over past 1 year he has had bilateral, left greater than right, foot drop. Patient is transitioned from walking independently to using a cane, to using a walker, now using a wheelchair. Patient also has numbness and tingling at the level of his knees and numbness and tingling in his hands. He has some low back pain. No neck pain.  REVIEW OF SYSTEMS: Full 14 system review of systems performed and notable only for as per history of present illness. Otherwise negative.  ALLERGIES: Allergies  Allergen Reactions  . Clopidogrel Bisulfate     REACTION: rash    HOME MEDICATIONS: Outpatient Prescriptions Prior to Visit  Medication Sig Dispense Refill  . albuterol (PROVENTIL) (2.5 MG/3ML) 0.083% nebulizer solution Take 3 mLs (2.5 mg total) by nebulization every 6 (six) hours as needed for wheezing.  75 mL  12  . albuterol (VENTOLIN HFA) 108 (90 BASE) MCG/ACT inhaler Inhale 2 puffs into the lungs every 6 (six) hours as needed for wheezing.  1 Inhaler  6  . aspirin 81 MG tablet Take 81 mg by mouth daily.        . digoxin (DIGOX) 0.125 MG tablet Take 0.5 tablets (0.0625 mg total) by mouth daily.  45 tablet  3  . furosemide (LASIX) 80 MG tablet TAKE ONE & ONE-HALF (1 & 1/2) TABLETS BY MOUTH IN THE  MORNING AND TAKE ONE TABLET IN THE EVENING  75 tablet  4  . gabapentin (NEURONTIN) 300 MG capsule Take 4 tablets by mouth in am, 2 tablets at noon, and 4 tablets at night      . lovastatin (MEVACOR) 20 MG tablet Take 20 mg by mouth 2 (two) times daily.      . metFORMIN (GLUCOPHAGE) 500 MG tablet Take 500 mg by mouth 2 (two) times daily.       . metoprolol (LOPRESSOR) 50 MG tablet Take 25 mg by mouth 2 (two) times daily.      . Multiple Vitamins-Minerals (CENTRUM PO) Take 1 tablet by mouth daily.        Marland Kitchen. NITROSTAT 0.4 MG SL tablet Place 0.4 mg under the tongue every 5 (five) minutes as needed.       . potassium chloride SA (K-DUR,KLOR-CON) 20 MEQ tablet TAKE TWO (2) TABLETS BY MOUTH TWICE DAILY  120 tablet  4  . predniSONE (DELTASONE) 10 MG tablet TAKE ONE TABLET BY MOUTH ONCE DAILY  30 tablet  6  . SPIRIVA HANDIHALER 18 MCG inhalation capsule PLACE ONE CAPSULE IN MOUTHPIECE (HANDIHALER) AND INHALE IN MOUTH ONCE A DAY.  30 capsule  5  . SYMBICORT 160-4.5 MCG/ACT inhaler INHALE 2 PUFFS INTO MOUTH TWICE DAILY  1 Inhaler  6  . warfarin (COUMADIN) 5 MG tablet TAKE ONE TABLET BY MOUTH DAILY ON SUNDAY, MONDAY, WEDNESDAY, AND FRIDAY, AND TAKE 1  1/2 TABLETS ON TUESDAY, THURSDAY, AND SATURDAY  45 tablet  3   No facility-administered medications prior to visit.    PAST MEDICAL HISTORY: Past Medical History  Diagnosis Date  . COPD with emphysema     chronic Dyspnea, followed by Dr. Dennie Bible. Wright, oxygen at night started 11/2008  . Atrial fibrillation     Permanent  . Warfarin anticoagulation     Coumadin  . Right ventricular dysfunction     Right Ventricle, mild moderately dilated by Echo 06/2008  . CAD (coronary artery disease)     .MI with a Taxus stent in 2004..  /   nuclear... June, 2010.. small scar or slight ischemia inferior wall  . Diabetes mellitus   . Hyperlipemia   . Obesity   . Peripheral edema     Mild  . Hypertension     relative hypotension... August, 2011... Cozaar stopped  .  Foot pain     Significant neuropathy has been evaluated at [facility].  . Ejection fraction     EF 60%, echo, 2010, technically difficult study  . Drug allergy     Plavix causes severe rash  . Pulmonary hypertension     PA pressure 45-50 mmHg, echo, June, 2013, patient hospitalized with a COPD exacerbation at that time.  . Constipation     July, 2013  . Blisters of multiple sites     Patient has blisters with clear liquid. There are 5 or more on the great toe and second toe of the right foot. There is one on the left great toe.  Marland Kitchen PAD (peripheral artery disease)     Vascular surgery consultation September, 2013  . Chronic diastolic CHF (congestive heart failure)     Patient requires diuretics.  . Peripheral arterial disease   . Neuropathy     Diabetic - feet    PAST SURGICAL HISTORY: Past Surgical History  Procedure Laterality Date  . Rotator cuff repair      x 3  . Knee surgery    . Cataract extraction Bilateral     FAMILY HISTORY: Family History  Problem Relation Age of Onset  . Heart disease Mother   . Cancer Father     throat    SOCIAL HISTORY:  History   Social History  . Marital Status: Married    Spouse Name: N/A    Number of Children: 3  . Years of Education: hs   Occupational History  . Dance movement psychotherapist    Social History Main Topics  . Smoking status: Former Smoker -- 2.00 packs/day for 40 years    Types: Cigarettes    Quit date: 01/06/1998  . Smokeless tobacco: Never Used  . Alcohol Use: No  . Drug Use: No  . Sexual Activity: Not on file   Other Topics Concern  . Not on file   Social History Narrative  . No narrative on file     PHYSICAL EXAM  Filed Vitals:   10/07/13 0934  BP: 111/54  Pulse: 87  Height: 5\' 9"  (1.753 m)  Weight: 217 lb (98.431 kg)    Not recorded    Body mass index is 32.03 kg/(m^2).  GENERAL EXAM: Patient is in no distress; well developed, nourished and groomed; neck is  supple  CARDIOVASCULAR: Regular rate and rhythm, no murmurs, no carotid bruits  NEUROLOGIC: MENTAL STATUS: awake, alert, oriented to person, place and time, recent and remote memory intact, normal attention and concentration, language fluent, comprehension intact, naming  intact, fund of knowledge appropriate CRANIAL NERVE: no papilledema on fundoscopic exam, pupils equal and reactive to light, visual fields full to confrontation, extraocular muscles intact, no nystagmus, facial sensation and strength symmetric, hearing intact, palate elevates symmetrically, uvula midline, shoulder shrug symmetric, tongue midline. MOTOR: normal bulk and tone; BUE (DELTOID 4, BICEPS 4, TRICEPS 3, GRIP 5), RLE (HF 5, KE 5, KF 4, DF 1-2, PF 3), LLE (HF 4, KE 5, KF 4, DF 0, PF 1). SENSORY: ABSENT VIB, LT, PP IN TOES/FEET, AND GRADIENT UP TO KNEES COORDINATION: finger-nose-finger, fine finger movements normal REFLEXES: BUE 1, BLE 0; MUTE TOES GAIT/STATION: BARELY ABLE TO STAND; WALKS WITH STEPPAGE GAIT USING 4 POINT CANE. UNSTEADY. CANNOT STAND ON TOES OR HEELS.    DIAGNOSTIC DATA (LABS, IMAGING, TESTING) - I reviewed patient records, labs, notes, testing and imaging myself where available.  No results found for this basename: WBC, HGB, HCT, MCV, PLT   No results found for this basename: na, k, cl, co2, glucose, bun, creatinine, calcium, prot, albumin, ast, alt, alkphos, bilitot, gfrnonaa, gfraa   No results found for this basename: CHOL, HDL, LDLCALC, LDLDIRECT, TRIG, CHOLHDL   No results found for this basename: HGBA1C   No results found for this basename: VITAMINB12   No results found for this basename: TSH    09/28/13 A1C - 6.3   ASSESSMENT AND PLAN  73 y.o. year old male here with 10 year history of burning, numbness, tingling in the feet, with 1-2 year history of progressive weakness. Suspect progressive, advanced peripheral neuropathy. This could be related to underlying diabetes. I will check  EMG nerve conduction study to evaluate for demyelinating neuropathy. Severe lumbar radiculopathy/spinal stenosis is another possibility.   PLAN: - EMG/NCS - consider home PT evaluation if cleared from cardiac/pulmonary standpoint - caution with driving and ambulation due to lower extremity weakness  Orders Placed This Encounter  Procedures  . NCV with EMG(electromyography)   Return for for NCV/EMG.    Suanne Marker, MD 10/07/2013, 2:58 PM Certified in Neurology, Neurophysiology and Neuroimaging  [facility] [address] [place], [place] [address] [phone]

## 2013-10-07 NOTE — Patient Instructions (Signed)
I will check EMG.  I recommend no driving until your leg weakness improves.

## 2013-10-24 ENCOUNTER — Ambulatory Visit: Payer: Medicare Other | Admitting: Critical Care Medicine

## 2013-10-25 ENCOUNTER — Ambulatory Visit (INDEPENDENT_AMBULATORY_CARE_PROVIDER_SITE_OTHER): Payer: Medicare Other | Admitting: Diagnostic Neuroimaging

## 2013-10-25 ENCOUNTER — Encounter (INDEPENDENT_AMBULATORY_CARE_PROVIDER_SITE_OTHER): Payer: Self-pay

## 2013-10-25 DIAGNOSIS — R29898 Other symptoms and signs involving the musculoskeletal system: Secondary | ICD-10-CM

## 2013-10-25 DIAGNOSIS — G609 Hereditary and idiopathic neuropathy, unspecified: Secondary | ICD-10-CM

## 2013-10-25 DIAGNOSIS — R202 Paresthesia of skin: Secondary | ICD-10-CM

## 2013-10-25 DIAGNOSIS — R292 Abnormal reflex: Secondary | ICD-10-CM

## 2013-10-25 DIAGNOSIS — Z0289 Encounter for other administrative examinations: Secondary | ICD-10-CM

## 2013-10-25 DIAGNOSIS — R200 Anesthesia of skin: Secondary | ICD-10-CM

## 2013-10-25 NOTE — Procedures (Signed)
   [facility]  NCS (NERVE CONDUCTION STUDY) WITH EMG (ELECTROMYOGRAPHY) REPORT   STUDY DATE: 10/25/13  PATIENT NAME: [name] DOB: 02/16/1940 MRN: [id number]  ORDERING CLINICIAN: Joycelyn SchmidVikram Penumalli, MD   TECHNOLOGIST: Gearldine ShownLorraine Jones  ELECTROMYOGRAPHER: Glenford BayleyVikram R. Penumalli, MD  CLINICAL INFORMATION: 73 year old male with lower extremity numbness, weakness and foot drop. Evaluate for neuropathy.  FINDINGS: NERVE CONDUCTION STUDY: Median motor response has prolonged distal latency (5.2 ms) decreased amplitude, normal conduction velocity and prolonged F-wave latency. Right ulnar motor response has prolonged distal latency (4.2 ms) decreased amplitude, normal conduction velocity and borderline F wave latency.   Bilateral peroneal and tibial motor responses could not be obtained.  Right median and right ulnar sensory responses have normal amplitude and slowed conduction velocities.  Bilateral peroneal sensory responses could not be obtained.   NEEDLE ELECTROMYOGRAPHY: Patient is on Coumadin anticoagulation. Limited needle EMG was performed. Right vastus medialis, tibialis anterior and gastrocnemius muscles were assessed. 2+ abnormal spontaneous activity in right tibialis anterior muscle with absent motor unit recruitment on exertion. Decreased motor unit recruitment in the right gastrocnemius. Right vastus medialis is normal.  IMPRESSION:  Abnormal study demonstrating: 1. Severe length-dependent axonal sensory motor polyneuropathy. 2. Limited needle EMG due to anticoagulation. Lumbar polyradiculopathy not excluded.    INTERPRETING PHYSICIAN:  Suanne MarkerVIKRAM R. PENUMALLI, MD Certified in Neurology, Neurophysiology and Neuroimaging  [facility] [address], [address] [place], [place] [address] [phone]

## 2013-10-28 LAB — NEUROPATHY PANEL
A/G Ratio: 1.5 (ref 0.7–2.0)
Albumin ELP: 3.8 g/dL (ref 3.2–5.6)
Alpha 1: 0.2 g/dL (ref 0.1–0.4)
Alpha 2: 0.8 g/dL (ref 0.4–1.2)
Angio Convert Enzyme: 35 U/L (ref 14–82)
Anti Nuclear Antibody(ANA): NEGATIVE
Beta: 1 g/dL (ref 0.6–1.3)
GAMMA GLOBULIN: 0.6 g/dL (ref 0.5–1.6)
Globulin, Total: 2.6 g/dL (ref 2.0–4.5)
Rhuematoid fact SerPl-aCnc: 10.9 IU/mL (ref 0.0–13.9)
Sed Rate: 2 mm/hr (ref 0–30)
TSH: 3.65 u[IU]/mL (ref 0.450–4.500)
Total Protein: 6.4 g/dL (ref 6.0–8.5)
VITAMIN B 12: 356 pg/mL (ref 211–946)
Vit D, 25-Hydroxy: 30 ng/mL (ref 30.0–100.0)

## 2013-10-31 ENCOUNTER — Other Ambulatory Visit: Payer: Self-pay | Admitting: Diagnostic Neuroimaging

## 2013-10-31 ENCOUNTER — Telehealth: Payer: Self-pay | Admitting: Diagnostic Neuroimaging

## 2013-10-31 DIAGNOSIS — Z139 Encounter for screening, unspecified: Secondary | ICD-10-CM

## 2013-10-31 DIAGNOSIS — R29898 Other symptoms and signs involving the musculoskeletal system: Secondary | ICD-10-CM

## 2013-10-31 DIAGNOSIS — R200 Anesthesia of skin: Secondary | ICD-10-CM

## 2013-10-31 DIAGNOSIS — R292 Abnormal reflex: Secondary | ICD-10-CM

## 2013-10-31 DIAGNOSIS — R202 Paresthesia of skin: Secondary | ICD-10-CM

## 2013-10-31 NOTE — Telephone Encounter (Signed)
Victorino DikeJennifer from [facility] calling regarding patient who has a [place] MRI scheduled for 11-09-13. They need to have an order put in for patient to have a DG eye foreign body x-ray because patient has had exposure to metal without using safety glasses.

## 2013-11-03 ENCOUNTER — Other Ambulatory Visit: Payer: Self-pay | Admitting: Cardiology

## 2013-11-03 ENCOUNTER — Other Ambulatory Visit: Payer: Self-pay | Admitting: Critical Care Medicine

## 2013-11-07 ENCOUNTER — Ambulatory Visit (INDEPENDENT_AMBULATORY_CARE_PROVIDER_SITE_OTHER): Payer: Medicare Other | Admitting: Critical Care Medicine

## 2013-11-07 ENCOUNTER — Encounter: Payer: Self-pay | Admitting: Critical Care Medicine

## 2013-11-07 VITALS — BP 110/72 | HR 72 | Temp 97.6°F | Ht 69.0 in | Wt 218.0 lb

## 2013-11-07 DIAGNOSIS — Z9181 History of falling: Secondary | ICD-10-CM | POA: Insufficient documentation

## 2013-11-07 DIAGNOSIS — J432 Centrilobular emphysema: Secondary | ICD-10-CM

## 2013-11-07 DIAGNOSIS — Z9189 Other specified personal risk factors, not elsewhere classified: Secondary | ICD-10-CM

## 2013-11-07 NOTE — Patient Instructions (Signed)
No change in medications. Return in         4 months 

## 2013-11-07 NOTE — Assessment & Plan Note (Signed)
Stable Gold D copd Plan Cont oxygen and inhaled meds as prescribed Vaccines up to date

## 2013-11-07 NOTE — Progress Notes (Signed)
Subjective:    Patient ID: Peter IdeJames C Patton, male    DOB: 02/02/1940, 73 y.o.   MRN: [id number]  HPI  [name] is a 73 y.o. male with history of RV dysfunction, COPD Gold D, emphysema, A fib, CAD, DM 2, pulm HTN.   11/07/2013 Chief Complaint  Patient presents with  . 6 month follow up    SOB and cough unchanged.  Cough is prod with white mucus.  No wheezing or chest tightness/pain.  Pt now in wheelchair, legs weaker.  Issue with peripheral vascular dz and neuropathy. Pt with 5-6 x falls at home .  Has had injury.  MRI of back is pending.  Pt has seen neurology    Review of Systems  Constitutional: Positive for fatigue. Negative for fever, chills and diaphoresis.  HENT: Negative for congestion, postnasal drip, rhinorrhea and sneezing.   Respiratory: Positive for cough and shortness of breath. Negative for choking, chest tightness and wheezing.       Objective:   Physical Exam  VITAL SIGNS:  BP 110/72 mmHg  Pulse 72  Temp(Src) 97.6 F (36.4 C) (Oral)  Ht 5\' 9"  (1.753 m)  Wt 218 lb (98.884 kg)  BMI 32.18 kg/m2  SpO2 99%   HEENT: Head is normocephalic and atraumatic. Extraocular muscles are intact. Pupils are equal, round, and reactive to light. Turbinates are not inflamed, no polyps observed.  Nares appeared normal. Mouth is well hydrated and without lesions. Mucous membranes are moist. Posterior pharynx clear of any exudate or lesions. NECK: Supple. No carotid bruits. No lymphadenopathy or thyromegaly. LUNGS: Clear to auscultation. HEART: Regular rate and rhythm without murmur.  ABDOMEN: Soft, nontender, and nondistended. Positive bowel sounds. No hepatosplenomegaly was noted.  EXTREMITIES: Without any cyanosis, clubbing, rash, lesions or edema. Has bruising on arms.  NEUROLOGIC: Cranial nerves II through XII are grossly intact.  SKIN: No ulceration or induration present.       Assessment & Plan:   COPD with emphysema Gold D Stable Gold D copd Plan Cont oxygen and  inhaled meds as prescribed Vaccines up to date     Updated Medication List Outpatient Encounter Prescriptions as of 11/07/2013  Medication Sig  . albuterol (PROVENTIL) (2.5 MG/3ML) 0.083% nebulizer solution Take 3 mLs (2.5 mg total) by nebulization every 6 (six) hours as needed for wheezing.  Marland Kitchen. albuterol (VENTOLIN HFA) 108 (90 BASE) MCG/ACT inhaler Inhale 2 puffs into the lungs every 6 (six) hours as needed for wheezing.  Marland Kitchen. aspirin 81 MG tablet Take 81 mg by mouth daily.    . calcium elemental as carbonate (PX ANTACID MAXIMUM STRENGTH) 400 MG tablet Chew 400 mg by mouth as needed.  . digoxin (DIGOX) 0.125 MG tablet Take 0.5 tablets (0.0625 mg total) by mouth daily.  Marland Kitchen. docusate sodium (STOOL SOFTENER) 100 MG capsule Take 100 mg by mouth 2 (two) times daily.  . furosemide (LASIX) 80 MG tablet TAKE ONE & ONE-HALF (1 & 1/2) TABLETS BY MOUTH IN THE MORNING AND TAKE ONE TABLET IN THE EVENING  . gabapentin (NEURONTIN) 300 MG capsule Take 4 tablets by mouth in am, 2 tablets at noon, and 4 tablets at night  . lovastatin (MEVACOR) 20 MG tablet Take 20 mg by mouth 2 (two) times daily.  . metFORMIN (GLUCOPHAGE) 500 MG tablet Take 500 mg by mouth 2 (two) times daily.   . metoprolol (LOPRESSOR) 50 MG tablet Take 25 mg by mouth 2 (two) times daily.  . Multiple Vitamins-Minerals (CENTRUM PO) Take 1 tablet by  mouth daily.    Marland Kitchen. NITROSTAT 0.4 MG SL tablet Place 0.4 mg under the tongue every 5 (five) minutes as needed.   . potassium chloride SA (K-DUR,KLOR-CON) 20 MEQ tablet TAKE TWO (2) TABLETS BY MOUTH TWICE DAILY  . predniSONE (DELTASONE) 10 MG tablet TAKE ONE TABLET BY MOUTH ONCE DAILY  . SPIRIVA HANDIHALER 18 MCG inhalation capsule PLACE ONE CAPSULE IN MOUTHPIECE (HANDIHALER) AND INHALE IN MOUTH ONCE A DAY.  . SYMBICORT 160-4.5 MCG/ACT inhaler INHALE 2 PUFFS INTO MOUTH TWICE DAILY  . tamsulosin (FLOMAX) 0.4 MG CAPS capsule Take 0.4 mg by mouth daily.  Marland Kitchen. warfarin (COUMADIN) 5 MG tablet Take 1 tablet daily  or as directed  . [DISCONTINUED] budesonide-formoterol (SYMBICORT) 160-4.5 MCG/ACT inhaler Inhale 2 puffs into the lungs 2 (two) times daily.  . [DISCONTINUED] Multiple Vitamin (MULTI-VITAMINS) TABS Take 1 tablet by mouth daily.  . [DISCONTINUED] tiotropium (SPIRIVA) 18 MCG inhalation capsule Place 1 capsule into inhaler and inhale daily.

## 2013-11-09 ENCOUNTER — Ambulatory Visit
Admission: RE | Admit: 2013-11-09 | Discharge: 2013-11-09 | Disposition: A | Discharge status: Home / Self Care | Payer: Medicare Other | Source: Ambulatory Visit | Attending: Diagnostic Neuroimaging | Admitting: Diagnostic Neuroimaging

## 2013-11-09 ENCOUNTER — Other Ambulatory Visit: Payer: Medicare Other

## 2013-11-09 DIAGNOSIS — R200 Anesthesia of skin: Secondary | ICD-10-CM

## 2013-11-09 DIAGNOSIS — R292 Abnormal reflex: Secondary | ICD-10-CM

## 2013-11-09 DIAGNOSIS — R202 Paresthesia of skin: Secondary | ICD-10-CM

## 2013-11-09 DIAGNOSIS — R29898 Other symptoms and signs involving the musculoskeletal system: Secondary | ICD-10-CM

## 2013-11-15 ENCOUNTER — Ambulatory Visit (INDEPENDENT_AMBULATORY_CARE_PROVIDER_SITE_OTHER): Payer: Medicare Other | Admitting: *Deleted

## 2013-11-15 DIAGNOSIS — Z5181 Encounter for therapeutic drug level monitoring: Secondary | ICD-10-CM

## 2013-11-15 DIAGNOSIS — I4891 Unspecified atrial fibrillation: Secondary | ICD-10-CM

## 2013-11-15 DIAGNOSIS — Z7901 Long term (current) use of anticoagulants: Secondary | ICD-10-CM

## 2013-11-15 LAB — POCT INR: INR: 2.1

## 2013-12-06 ENCOUNTER — Other Ambulatory Visit: Payer: Self-pay | Admitting: Cardiology

## 2013-12-16 ENCOUNTER — Other Ambulatory Visit: Payer: Self-pay | Admitting: Cardiology

## 2013-12-27 ENCOUNTER — Ambulatory Visit (INDEPENDENT_AMBULATORY_CARE_PROVIDER_SITE_OTHER): Payer: Medicare Other | Admitting: *Deleted

## 2013-12-27 ENCOUNTER — Other Ambulatory Visit: Payer: Self-pay | Admitting: Critical Care Medicine

## 2013-12-27 DIAGNOSIS — Z7901 Long term (current) use of anticoagulants: Secondary | ICD-10-CM

## 2013-12-27 DIAGNOSIS — I4891 Unspecified atrial fibrillation: Secondary | ICD-10-CM

## 2013-12-27 DIAGNOSIS — Z5181 Encounter for therapeutic drug level monitoring: Secondary | ICD-10-CM

## 2013-12-27 LAB — POCT INR: INR: 5.2

## 2014-01-05 ENCOUNTER — Ambulatory Visit (INDEPENDENT_AMBULATORY_CARE_PROVIDER_SITE_OTHER): Payer: Medicare Other | Admitting: *Deleted

## 2014-01-05 DIAGNOSIS — Z7901 Long term (current) use of anticoagulants: Secondary | ICD-10-CM

## 2014-01-05 DIAGNOSIS — Z5181 Encounter for therapeutic drug level monitoring: Secondary | ICD-10-CM

## 2014-01-05 DIAGNOSIS — I4891 Unspecified atrial fibrillation: Secondary | ICD-10-CM

## 2014-01-05 LAB — POCT INR: INR: 2

## 2014-01-09 ENCOUNTER — Other Ambulatory Visit: Payer: Self-pay | Admitting: Cardiology

## 2014-01-15 ENCOUNTER — Encounter: Payer: Self-pay | Admitting: Cardiology

## 2014-01-15 DIAGNOSIS — G629 Polyneuropathy, unspecified: Secondary | ICD-10-CM | POA: Insufficient documentation

## 2014-01-15 DIAGNOSIS — M519 Unspecified thoracic, thoracolumbar and lumbosacral intervertebral disc disorder: Secondary | ICD-10-CM | POA: Insufficient documentation

## 2014-01-17 ENCOUNTER — Ambulatory Visit (INDEPENDENT_AMBULATORY_CARE_PROVIDER_SITE_OTHER): Payer: Medicare Other | Admitting: Cardiology

## 2014-01-17 ENCOUNTER — Other Ambulatory Visit: Payer: Self-pay | Admitting: Family Medicine

## 2014-01-17 ENCOUNTER — Encounter: Payer: Self-pay | Admitting: Cardiology

## 2014-01-17 VITALS — BP 100/78 | HR 49 | Ht 69.0 in | Wt 212.0 lb

## 2014-01-17 DIAGNOSIS — I5032 Chronic diastolic (congestive) heart failure: Secondary | ICD-10-CM

## 2014-01-17 DIAGNOSIS — Z01810 Encounter for preprocedural cardiovascular examination: Secondary | ICD-10-CM

## 2014-01-17 DIAGNOSIS — S63297A Dislocation of distal interphalangeal joint of left little finger, initial encounter: Secondary | ICD-10-CM

## 2014-01-17 DIAGNOSIS — I4891 Unspecified atrial fibrillation: Secondary | ICD-10-CM

## 2014-01-17 DIAGNOSIS — J9611 Chronic respiratory failure with hypoxia: Secondary | ICD-10-CM

## 2014-01-17 NOTE — Assessment & Plan Note (Signed)
He is followed carefully by pulmonary. His respiratory status is relatively stable for him.

## 2014-01-17 NOTE — Progress Notes (Signed)
Patient ID: [name], male   DOB: May 12, 1940, 74 y.o.   MRN: [id number]    HPI Patient is seen today to follow-up chronic diastolic heart failure and atrial fibrillation. He is actually doing relatively well from the cardiac viewpoint. He has severe lung disease that is followed carefully by Dr. Delford Field. In addition he has significant problems with peripheral neuropathy in his legs. He's had an EMG that is consistent with polyneuropathy. However lumbar disc effect cannot be excluded. He then had MRI. He has bulging disks from L1-L5. He says that he will be seeing Dr. Channing Mutters for follow-up.  More recently he has an elevated PSA that's being followed. In addition he had a swollen finger. He's received oral antibiotics. However he tells me that she x-ray of the finger raises the question that he might have ongoing infection. An MRI is to be done.  Allergies  Allergen Reactions  . Clopidogrel Bisulfate     REACTION: rash    Current Outpatient Prescriptions  Medication Sig Dispense Refill  . albuterol (PROVENTIL) (2.5 MG/3ML) 0.083% nebulizer solution Take 3 mLs (2.5 mg total) by nebulization every 6 (six) hours as needed for wheezing. 75 mL 12  . albuterol (VENTOLIN HFA) 108 (90 BASE) MCG/ACT inhaler Inhale 2 puffs into the lungs every 6 (six) hours as needed for wheezing. 1 Inhaler 6  . aspirin 81 MG tablet Take 81 mg by mouth daily.      . calcium elemental as carbonate (PX ANTACID MAXIMUM STRENGTH) 400 MG tablet Chew 400 mg by mouth as needed.    . digoxin (DIGOX) 0.125 MG tablet Take 0.5 tablets (0.0625 mg total) by mouth daily. 45 tablet 3  . docusate sodium (STOOL SOFTENER) 100 MG capsule Take 100 mg by mouth 2 (two) times daily.    . furosemide (LASIX) 80 MG tablet TAKE 1 AND 1/2 TABLETS BY MOUTH IN THE MORNING AND ONE TABLET IN THE EVENING 75 tablet 2  . gabapentin (NEURONTIN) 300 MG capsule Take 4 tablets by mouth in am, 2 tablets at noon, and 4 tablets at night    . lovastatin (MEVACOR)  20 MG tablet Take 20 mg by mouth 2 (two) times daily.    . metFORMIN (GLUCOPHAGE) 500 MG tablet Take 500 mg by mouth 2 (two) times daily.     . metoprolol (LOPRESSOR) 50 MG tablet Take 25 mg by mouth 2 (two) times daily.    . Multiple Vitamins-Minerals (CENTRUM PO) Take 1 tablet by mouth daily.      Marland Kitchen NITROSTAT 0.4 MG SL tablet Place 0.4 mg under the tongue every 5 (five) minutes as needed.     . potassium chloride SA (K-DUR,KLOR-CON) 20 MEQ tablet TAKE TWO (2) TABLETS BY MOUTH TWICE DAILY 120 tablet 1  . predniSONE (DELTASONE) 10 MG tablet TAKE ONE TABLET BY MOUTH ONCE DAILY 30 tablet 5  . SPIRIVA HANDIHALER 18 MCG inhalation capsule PLACE ONE CAPSULE IN MOUTHPIECE (HANDIHALER) AND INHALE IN MOUTH ONCE A DAY. 30 capsule 5  . SYMBICORT 160-4.5 MCG/ACT inhaler INHALE TWO PUFFS BY MOUTH TWICE DAILY 1 Inhaler 5  . tamsulosin (FLOMAX) 0.4 MG CAPS capsule Take 0.4 mg by mouth daily.    Marland Kitchen warfarin (COUMADIN) 5 MG tablet Take 1 tablet daily or as directed 45 tablet 3   No current facility-administered medications for this visit.    History   Social History  . Marital Status: Married    Spouse Name: N/A    Number of Children: 3  .  Years of Education: hs   Occupational History  . Dance movement psychotherapist    Social History Main Topics  . Smoking status: Former Smoker -- 2.00 packs/day for 40 years    Types: Cigarettes    Quit date: 01/06/1998  . Smokeless tobacco: Never Used  . Alcohol Use: No  . Drug Use: No  . Sexual Activity: Not on file   Other Topics Concern  . Not on file   Social History Narrative    Family History  Problem Relation Age of Onset  . Heart disease Mother   . Cancer Father     throat    Past Medical History  Diagnosis Date  . COPD with emphysema     chronic Dyspnea, followed by Dr. Dennie Bible. [place], oxygen at night started 11/2008  . Atrial fibrillation     Permanent  . Warfarin anticoagulation     Coumadin  . Right ventricular dysfunction     Right Ventricle,  mild moderately dilated by Echo 06/2008  . CAD (coronary artery disease)     .MI with a Taxus stent in 2004..  /   nuclear... June, 2010.. small scar or slight ischemia inferior wall  . Diabetes mellitus   . Hyperlipemia   . Obesity   . Peripheral edema     Mild  . Hypertension     relative hypotension... August, 2011... Cozaar stopped  . Foot pain     Significant neuropathy has been evaluated at [facility].  . Ejection fraction     EF 60%, echo, 2010, technically difficult study  . Drug allergy     Plavix causes severe rash  . Pulmonary hypertension     PA pressure 45-50 mmHg, echo, June, 2013, patient hospitalized with a COPD exacerbation at that time.  . Constipation     July, 2013  . Blisters of multiple sites     Patient has blisters with clear liquid. There are 5 or more on the great toe and second toe of the right foot. There is one on the left great toe.  Marland Kitchen PAD (peripheral artery disease)     Vascular surgery consultation September, 2013  . Chronic diastolic CHF (congestive heart failure)     Patient requires diuretics.  . Peripheral arterial disease   . Neuropathy     Diabetic - feet    Past Surgical History  Procedure Laterality Date  . Rotator cuff repair      x 3  . Knee surgery    . Cataract extraction Bilateral     Patient Active Problem List   Diagnosis Date Noted  . Peripheral neuropathy 01/15/2014  . Lumbar disc disease 01/15/2014  . At high risk for injury related to fall 11/07/2013  . 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 Encounter for therapeutic drug monitoring 02/04/2013  . Pain in limb- Bilateral leg and right thumb and index finger 08/13/2012  . Drug intolerance 08/11/2012  . At high risk for falls 05/04/2012  . Pain in joint, ankle and foot 03/04/2012  . Edema of both legs 02/13/2012  . Chronic diastolic CHF (congestive heart failure)   . PAD (peripheral artery disease)   . Venous insufficiency 10/10/2011  . Blisters of  multiple sites   . Chronic respiratory failure 07/08/2011  . Ejection fraction   . Pulmonary hypertension   . Drug allergy   . Constipation   . Right ventricular dysfunction   . COPD with emphysema Gold D   . Atrial fibrillation   .  Warfarin anticoagulation   . CAD (coronary artery disease)   . Hyperlipemia   . Obesity   . Peripheral edema   . Hypertension   . Foot pain     ROS  Patient denies fever, chills, headache, sweats, rash, change in vision, change in hearing, chest pain, cough, nausea or vomiting, urinary symptoms. All other systems are reviewed and are negative.  PHYSICAL EXAM Patient is oriented to person time and place. Affect is normal. Head is atraumatic. He is here with his wife. He is overweight. Sclera and conjunctiva are normal. Lungs reveal scattered rhonchi. There is no respiratory distress at rest. Cardiac exam reveals an S1 and S2. The rhythm is irregular. Abdomen is soft. He has trace peripheral edema in the left foot. There is none in the right foot. His finger is not reddened. However he is not able to bend the left pinky.  Filed Vitals:   01/17/14 1442  BP: 100/78  Pulse: 49  Height: 5\' 9"  (1.753 m)  Weight: 212 lb (96.163 kg)  SpO2: 98%   EKG is done today and reviewed by me. There is atrial fibrillation with a controlled rate.  ASSESSMENT & PLAN

## 2014-01-17 NOTE — Assessment & Plan Note (Signed)
His atrial fib is chronic. The rate is controlled. He is anticoagulated. No change in therapy.

## 2014-01-17 NOTE — Patient Instructions (Signed)
Your physician recommends that you schedule a follow-up appointment in: 3 months. You will receive a reminder letter in the mail in about 1-2 months reminding you to call and schedule your appointment. If you don't receive this letter, please contact our office. Your physician recommends that you continue on your current medications as directed. Please refer to the Current Medication list given to you today. 

## 2014-01-17 NOTE — Assessment & Plan Note (Signed)
Chronic diastolic CHF is under control. No change in therapy.

## 2014-01-17 NOTE — Assessment & Plan Note (Signed)
If the patient needs surgery to the small finger of his left hand, he is stable from the cardiac viewpoint assuming that it can be done under local of some type. He is not a good candidate to have general anesthesia.

## 2014-01-19 ENCOUNTER — Ambulatory Visit (INDEPENDENT_AMBULATORY_CARE_PROVIDER_SITE_OTHER): Payer: Medicare Other | Admitting: *Deleted

## 2014-01-19 DIAGNOSIS — I4891 Unspecified atrial fibrillation: Secondary | ICD-10-CM

## 2014-01-19 DIAGNOSIS — Z7901 Long term (current) use of anticoagulants: Secondary | ICD-10-CM | POA: Diagnosis not present

## 2014-01-19 DIAGNOSIS — I482 Chronic atrial fibrillation, unspecified: Secondary | ICD-10-CM

## 2014-01-19 DIAGNOSIS — Z5181 Encounter for therapeutic drug level monitoring: Secondary | ICD-10-CM | POA: Diagnosis not present

## 2014-01-19 LAB — POCT INR: INR: 1.5

## 2014-01-26 ENCOUNTER — Other Ambulatory Visit: Payer: Medicare Other

## 2014-01-28 ENCOUNTER — Other Ambulatory Visit: Payer: Medicare Other

## 2014-02-07 ENCOUNTER — Ambulatory Visit (INDEPENDENT_AMBULATORY_CARE_PROVIDER_SITE_OTHER): Payer: Medicare Other | Admitting: *Deleted

## 2014-02-07 DIAGNOSIS — I4891 Unspecified atrial fibrillation: Secondary | ICD-10-CM

## 2014-02-07 DIAGNOSIS — Z7901 Long term (current) use of anticoagulants: Secondary | ICD-10-CM

## 2014-02-07 DIAGNOSIS — I482 Chronic atrial fibrillation, unspecified: Secondary | ICD-10-CM

## 2014-02-07 DIAGNOSIS — Z5181 Encounter for therapeutic drug level monitoring: Secondary | ICD-10-CM

## 2014-02-07 LAB — POCT INR: INR: 2.4

## 2014-02-11 ENCOUNTER — Ambulatory Visit
Admission: RE | Admit: 2014-02-11 | Discharge: 2014-02-11 | Disposition: A | Discharge status: Home / Self Care | Payer: Medicare Other | Source: Ambulatory Visit | Attending: Family Medicine | Admitting: Family Medicine

## 2014-02-11 DIAGNOSIS — S63297A Dislocation of distal interphalangeal joint of left little finger, initial encounter: Secondary | ICD-10-CM

## 2014-02-27 ENCOUNTER — Other Ambulatory Visit: Payer: Self-pay | Admitting: Cardiology

## 2014-02-28 ENCOUNTER — Telehealth: Payer: Self-pay | Admitting: *Deleted

## 2014-02-28 NOTE — Telephone Encounter (Signed)
Reviewed last INR note.  Pt to hold coumadin today since Bactrim increases INR.  INR appt rescheduled for 03/02/14.  Wife verbalizes understanding.

## 2014-03-02 ENCOUNTER — Ambulatory Visit (INDEPENDENT_AMBULATORY_CARE_PROVIDER_SITE_OTHER): Payer: Medicare Other | Admitting: *Deleted

## 2014-03-02 DIAGNOSIS — I482 Chronic atrial fibrillation, unspecified: Secondary | ICD-10-CM

## 2014-03-02 DIAGNOSIS — I4891 Unspecified atrial fibrillation: Secondary | ICD-10-CM

## 2014-03-02 DIAGNOSIS — Z7901 Long term (current) use of anticoagulants: Secondary | ICD-10-CM

## 2014-03-02 DIAGNOSIS — Z5181 Encounter for therapeutic drug level monitoring: Secondary | ICD-10-CM

## 2014-03-02 LAB — POCT INR: INR: 1.4

## 2014-03-07 ENCOUNTER — Other Ambulatory Visit: Payer: Self-pay | Admitting: Cardiology

## 2014-03-09 ENCOUNTER — Ambulatory Visit (INDEPENDENT_AMBULATORY_CARE_PROVIDER_SITE_OTHER): Payer: Medicare Other | Admitting: *Deleted

## 2014-03-09 DIAGNOSIS — Z5181 Encounter for therapeutic drug level monitoring: Secondary | ICD-10-CM

## 2014-03-09 DIAGNOSIS — I482 Chronic atrial fibrillation, unspecified: Secondary | ICD-10-CM

## 2014-03-09 DIAGNOSIS — Z7901 Long term (current) use of anticoagulants: Secondary | ICD-10-CM

## 2014-03-09 DIAGNOSIS — I4891 Unspecified atrial fibrillation: Secondary | ICD-10-CM

## 2014-03-09 LAB — POCT INR: INR: 3.6

## 2014-03-14 ENCOUNTER — Other Ambulatory Visit: Payer: Self-pay | Admitting: Critical Care Medicine

## 2014-03-14 ENCOUNTER — Other Ambulatory Visit: Payer: Self-pay | Admitting: Cardiology

## 2014-03-30 ENCOUNTER — Ambulatory Visit (INDEPENDENT_AMBULATORY_CARE_PROVIDER_SITE_OTHER): Payer: Medicare Other | Admitting: *Deleted

## 2014-03-30 DIAGNOSIS — I482 Chronic atrial fibrillation, unspecified: Secondary | ICD-10-CM

## 2014-03-30 DIAGNOSIS — Z5181 Encounter for therapeutic drug level monitoring: Secondary | ICD-10-CM

## 2014-03-30 DIAGNOSIS — Z7901 Long term (current) use of anticoagulants: Secondary | ICD-10-CM | POA: Diagnosis not present

## 2014-03-30 DIAGNOSIS — I4891 Unspecified atrial fibrillation: Secondary | ICD-10-CM | POA: Diagnosis not present

## 2014-03-30 LAB — POCT INR: INR: 2.8

## 2014-04-27 ENCOUNTER — Ambulatory Visit (INDEPENDENT_AMBULATORY_CARE_PROVIDER_SITE_OTHER): Payer: Medicare Other | Admitting: Pharmacist

## 2014-04-27 ENCOUNTER — Ambulatory Visit (INDEPENDENT_AMBULATORY_CARE_PROVIDER_SITE_OTHER): Payer: Medicare Other | Admitting: Cardiology

## 2014-04-27 ENCOUNTER — Encounter: Payer: Self-pay | Admitting: Cardiology

## 2014-04-27 VITALS — BP 104/82 | HR 84 | Ht 69.0 in | Wt 212.0 lb

## 2014-04-27 DIAGNOSIS — I482 Chronic atrial fibrillation, unspecified: Secondary | ICD-10-CM

## 2014-04-27 DIAGNOSIS — Z7901 Long term (current) use of anticoagulants: Secondary | ICD-10-CM | POA: Diagnosis not present

## 2014-04-27 DIAGNOSIS — I5032 Chronic diastolic (congestive) heart failure: Secondary | ICD-10-CM

## 2014-04-27 DIAGNOSIS — Z5181 Encounter for therapeutic drug level monitoring: Secondary | ICD-10-CM

## 2014-04-27 DIAGNOSIS — I4891 Unspecified atrial fibrillation: Secondary | ICD-10-CM

## 2014-04-27 LAB — POCT INR: INR: 2.6

## 2014-04-27 NOTE — Assessment & Plan Note (Signed)
Patient's volume status is stable. No change in therapy. 

## 2014-04-27 NOTE — Progress Notes (Signed)
Cardiology Office Note   Date:  04/27/2014   ID:  [name], DOB [birth year], MRN [id number]  PCP:  Juliette Alcide, MD  Cardiologist:  Willa Rough, MD   Chief Complaint  Patient presents with  . Appointment    Follow-up diastolic CHF and atrial fibrillation      History of Present Illness: [name] is a 74 y.o. male who presents today to follow-up CHF and atrial fibrillation. Despite his multitude of medical problems he still has a good attitude. He follows with Dr. Delford Field of the pulmonary team. He's not having any marked shortness of breath. His volume status remains stable.    Past Medical History  Diagnosis Date  . COPD with emphysema     chronic Dyspnea, followed by Dr. Dennie Bible. Wright, oxygen at night started 11/2008  . Atrial fibrillation     Permanent  . Warfarin anticoagulation     Coumadin  . Right ventricular dysfunction     Right Ventricle, mild moderately dilated by Echo 06/2008  . CAD (coronary artery disease)     .MI with a Taxus stent in 2004..  /   nuclear... June, 2010.. small scar or slight ischemia inferior wall  . Diabetes mellitus   . Hyperlipemia   . Obesity   . Peripheral edema     Mild  . Hypertension     relative hypotension... August, 2011... Cozaar stopped  . Foot pain     Significant neuropathy has been evaluated at [facility].  . Ejection fraction     EF 60%, echo, 2010, technically difficult study  . Drug allergy     Plavix causes severe rash  . Pulmonary hypertension     PA pressure 45-50 mmHg, echo, June, 2013, patient hospitalized with a COPD exacerbation at that time.  . Constipation     July, 2013  . Blisters of multiple sites     Patient has blisters with clear liquid. There are 5 or more on the great toe and second toe of the right foot. There is one on the left great toe.  Marland Kitchen PAD (peripheral artery disease)     Vascular surgery consultation September, 2013  . Chronic diastolic CHF (congestive  heart failure)     Patient requires diuretics.  . Peripheral arterial disease   . Neuropathy     Diabetic - feet    Past Surgical History  Procedure Laterality Date  . Rotator cuff repair      x 3  . Knee surgery    . Cataract extraction Bilateral     Patient Active Problem List   Diagnosis Date Noted  . Pre-operative cardiovascular examination 01/17/2014  . Peripheral neuropathy 01/15/2014  . Lumbar disc disease 01/15/2014  . At high risk for injury related to fall 11/07/2013  . 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 Encounter for therapeutic drug monitoring 02/04/2013  . Pain in limb- Bilateral leg and right thumb and index finger 08/13/2012  . Drug intolerance 08/11/2012  . At high risk for falls 05/04/2012  . Pain in joint, ankle and foot 03/04/2012  . Edema of both legs 02/13/2012  . Chronic diastolic CHF (congestive heart failure)   . PAD (peripheral artery disease)   . Venous insufficiency 10/10/2011  . Blisters of multiple sites   . Chronic respiratory failure 07/08/2011  . Ejection fraction   . Pulmonary hypertension   . Drug allergy   . Constipation   . Right ventricular dysfunction   . COPD  with emphysema Gold D   . Chronic atrial fibrillation   . Warfarin anticoagulation   . CAD (coronary artery disease)   . Hyperlipemia   . Obesity   . Peripheral edema   . Hypertension   . Foot pain       Current Outpatient Prescriptions  Medication Sig Dispense Refill  . albuterol (PROVENTIL) (2.5 MG/3ML) 0.083% nebulizer solution Take 3 mLs (2.5 mg total) by nebulization every 6 (six) hours as needed for wheezing. 75 mL 12  . albuterol (VENTOLIN HFA) 108 (90 BASE) MCG/ACT inhaler Inhale 2 puffs into the lungs every 6 (six) hours as needed for wheezing. 1 Inhaler 6  . aspirin 81 MG tablet Take 81 mg by mouth daily.      . calcium elemental as carbonate (PX ANTACID MAXIMUM STRENGTH) 400 MG tablet Chew 400 mg by mouth as needed.    Marland Kitchen. DIGOX 125 MCG tablet TAKE ONE-HALF  TABLET BY MOUTH DAILY 45 tablet 3  . docusate sodium (STOOL SOFTENER) 100 MG capsule Take 100 mg by mouth 2 (two) times daily.    . finasteride (PROSCAR) 5 MG tablet Take 5 mg by mouth daily.  3  . furosemide (LASIX) 80 MG tablet TAKE 1 AND 1/2 TABLETS BY MOUTH IN THE MORNING AND ONE TABLET IN THE EVENING 75 tablet 3  . gabapentin (NEURONTIN) 300 MG capsule Take 4 tablets by mouth in am, 2 tablets at noon, and 4 tablets at night    . lovastatin (MEVACOR) 40 MG tablet Take 40 mg by mouth at bedtime.    . metFORMIN (GLUCOPHAGE) 500 MG tablet Take 500 mg by mouth 2 (two) times daily.     . metoprolol (LOPRESSOR) 50 MG tablet Take 25 mg by mouth 2 (two) times daily.    . Multiple Vitamins-Minerals (CENTRUM PO) Take 1 tablet by mouth daily.      Marland Kitchen. NITROSTAT 0.4 MG SL tablet Place 0.4 mg under the tongue every 5 (five) minutes as needed.     . potassium chloride SA (K-DUR,KLOR-CON) 20 MEQ tablet TAKE TWO (2) TABLETS BY MOUTH TWICE DAILY 120 tablet 3  . predniSONE (DELTASONE) 10 MG tablet TAKE ONE TABLET BY MOUTH ONCE DAILY 30 tablet 5  . SPIRIVA HANDIHALER 18 MCG inhalation capsule PLACE ONE CAPSULE IN MOUTHPIECE (HANDIHALER) AND INHALE IN MOUTH ONCE A DAY. 30 capsule 6  . SYMBICORT 160-4.5 MCG/ACT inhaler INHALE TWO PUFFS BY MOUTH TWICE DAILY 1 Inhaler 5  . tamsulosin (FLOMAX) 0.4 MG CAPS capsule Take 0.4 mg by mouth daily.    Marland Kitchen. warfarin (COUMADIN) 5 MG tablet Take 1 tablet daily or as directed 45 tablet 3   No current facility-administered medications for this visit.    Allergies:   Clopidogrel bisulfate    Social History:  The patient  reports that he quit smoking about 16 years ago. His smoking use included Cigarettes. He has a 80 pack-year smoking history. He has never used smokeless tobacco. He reports that he does not drink alcohol or use illicit drugs.   Family History:  The patient's family history includes Cancer in his father; Heart disease in his mother.    ROS:  Please see the  history of present illness.   Patient denies fever, chills, headache, sweats, rash, change in vision, change in hearing, chest pain, nausea vomiting, urinary symptoms. All other systems are reviewed and are negative.    PHYSICAL EXAM: VS:  BP 104/82 mmHg  Pulse 84  Ht 5\' 9"  (1.753 m)  Wt 212 lb (96.163 kg)  BMI 31.29 kg/m2  SpO2 97% , Patient is here with a family member. He walks with a rolling walker. He is oriented to person time and place. Affect is normal. Head is atraumatic. Sclera and conjunctiva are normal. There is no jugular venous distention. Lungs reveal decreased breath sounds. There is no respiratory distress. Cardiac exam reveals very distant heart sounds. The abdomen is soft. There is no significant peripheral edema. The small finger on his left hand does not flex because the joint is destroyed. It is not painful. He has varied skin blisters that, and go. Exact etiology of these or not clear but they have been assessed fully.  EKG:   EKG is not done today.  Recent Labs: 10/25/2013: TSH 3.650    Lipid Panel No results found for: CHOL, TRIG, HDL, CHOLHDL, VLDL, LDLCALC, LDLDIRECT    Wt Readings from Last 3 Encounters:  04/27/14 212 lb (96.163 kg)  01/17/14 212 lb (96.163 kg)  11/09/13 218 lb (98.884 kg)      Current medicines are reviewed  The patient understands his medications.     ASSESSMENT AND PLAN:

## 2014-04-27 NOTE — Assessment & Plan Note (Signed)
Atrial fibrillation rate is controlled. He is anticoagulated. No change in therapy. 

## 2014-04-27 NOTE — Patient Instructions (Signed)
Your physician recommends that you continue on your current medications as directed. Please refer to the Current Medication list given to you today.  Your physician recommends that you schedule a follow-up appointment in: 3 months  

## 2014-05-06 ENCOUNTER — Other Ambulatory Visit: Payer: Self-pay | Admitting: Critical Care Medicine

## 2014-05-08 ENCOUNTER — Other Ambulatory Visit: Payer: Self-pay | Admitting: Cardiology

## 2014-05-15 ENCOUNTER — Encounter: Payer: Self-pay | Admitting: Adult Health

## 2014-05-15 ENCOUNTER — Ambulatory Visit (INDEPENDENT_AMBULATORY_CARE_PROVIDER_SITE_OTHER): Payer: Medicare Other | Admitting: Adult Health

## 2014-05-15 ENCOUNTER — Ambulatory Visit (INDEPENDENT_AMBULATORY_CARE_PROVIDER_SITE_OTHER)
Admission: RE | Admit: 2014-05-15 | Discharge: 2014-05-15 | Disposition: A | Discharge status: Home / Self Care | Payer: Medicare Other | Source: Ambulatory Visit | Attending: Adult Health | Admitting: Adult Health

## 2014-05-15 VITALS — BP 122/70 | HR 82 | Temp 97.6°F | Ht 69.0 in | Wt 209.0 lb

## 2014-05-15 DIAGNOSIS — J439 Emphysema, unspecified: Secondary | ICD-10-CM

## 2014-05-15 DIAGNOSIS — I5032 Chronic diastolic (congestive) heart failure: Secondary | ICD-10-CM | POA: Diagnosis not present

## 2014-05-15 DIAGNOSIS — J9611 Chronic respiratory failure with hypoxia: Secondary | ICD-10-CM | POA: Diagnosis not present

## 2014-05-15 NOTE — Assessment & Plan Note (Signed)
Appears compenstaed without flare

## 2014-05-15 NOTE — Assessment & Plan Note (Addendum)
Continue on oxygen with activity and at bedtime  Follow-up with Dr. Delford FieldWright in 4 months and as needed

## 2014-05-15 NOTE — Assessment & Plan Note (Addendum)
Compensated on present regimen  cxr today   Plan   Continue on Symbicort and Spiriva  Continue on prednisone 10 mg daily  Continue on oxygen with activity and at bedtime  chest x-ray today  Follow-up with Dr. Delford FieldWright in 4 months and as needed

## 2014-05-15 NOTE — Progress Notes (Signed)
   Subjective:    Patient ID: [name], male    DOB: 06/26/1940, 74 y.o.   MRN: [id number]  HPI [name] is a 74 y.o. male with history of RV dysfunction, COPD Gold D, emphysema, A fib, CAD, DM 2, pulm HTN.  05/15/2014 Follow up : COPD  Steroid dependent And chronic respiratory failure with oxygen with activity and at bedtime  returns for six-month follow-up for COPD  He remains on Spiriva and Symbicort.  He is on chronic steroids with prednisone at 10 mg daily.  He says overall he is doing okay  Breathing is at baseline.  He denies any hemoptysis chest pain, orthopnea, PND, or increased leg swelling.  Prevnar and Pneumovax are up-to-date    Review of Systems Constitutional:   No  weight loss, night sweats,  Fevers, chills, fatigue, or  lassitude.  HEENT:   No headaches,  Difficulty swallowing,  Tooth/dental problems, or  Sore throat,                No sneezing, itching, ear ache, nasal congestion, post nasal drip,   CV:  No chest pain,  Orthopnea, PND, swelling in lower extremities, anasarca, dizziness, palpitations, syncope.   GI  No heartburn, indigestion, abdominal pain, nausea, vomiting, diarrhea, change in bowel habits, loss of appetite, bloody stools.   Resp: No shortness of breath with exertion or at rest.  No excess mucus, no productive cough,  No non-productive cough,  No coughing up of blood.  No change in color of mucus.  No wheezing.  No chest wall deformity  Skin: no rash or lesions.  GU: no dysuria, change in color of urine, no urgency or frequency.  No flank pain, no hematuria   MS:  No joint pain or swelling.  No decreased range of motion.  No back pain.  Psych:  No change in mood or affect. No depression or anxiety.  No memory loss.         Objective:   Physical Exam GEN: A/Ox3; pleasant , NAD,  Elderly and chronically ill appearing  HEENT:  [place]/AT,  EACs-clear, TMs-wnl, NOSE-clear, THROAT-clear, no lesions, no postnasal drip or exudate noted.     NECK:  Supple w/ fair ROM; no JVD; normal carotid impulses w/o bruits; no thyromegaly or nodules palpated; no lymphadenopathy.  RESP   Decreased breath sounds in the bases, no accessory muscle use, no dullness to percussion  CARD:  RRR, no m/r/g  , tr peripheral edema, pulses intact, no cyanosis or clubbing.  GI:   Soft & nt; nml bowel sounds; no organomegaly or masses detected.  Musco: Warm bil, no deformities or joint swelling noted.   Neuro: alert, no focal deficits noted.    Skin: Warm, no lesions or rashes         Assessment & Plan:

## 2014-05-15 NOTE — Patient Instructions (Signed)
Continue on Symbicort and Spiriva  Continue on prednisone 10 mg daily  Continue on oxygen with activity and at bedtime  chest x-ray today  Follow-up with Dr. Delford FieldWright in 4 months and as needed

## 2014-05-16 NOTE — Progress Notes (Signed)
Quick Note:  Called spoke with patient, advised of cxr results / recs as stated by TP. Pt verbalized his understanding and denied any questions. ______ 

## 2014-05-25 ENCOUNTER — Ambulatory Visit (INDEPENDENT_AMBULATORY_CARE_PROVIDER_SITE_OTHER): Payer: Medicare Other | Admitting: *Deleted

## 2014-05-25 DIAGNOSIS — Z5181 Encounter for therapeutic drug level monitoring: Secondary | ICD-10-CM | POA: Diagnosis not present

## 2014-05-25 DIAGNOSIS — I482 Chronic atrial fibrillation, unspecified: Secondary | ICD-10-CM

## 2014-05-25 DIAGNOSIS — I4891 Unspecified atrial fibrillation: Secondary | ICD-10-CM

## 2014-05-25 DIAGNOSIS — Z7901 Long term (current) use of anticoagulants: Secondary | ICD-10-CM | POA: Diagnosis not present

## 2014-05-25 LAB — POCT INR: INR: 2.2

## 2014-07-06 ENCOUNTER — Ambulatory Visit (INDEPENDENT_AMBULATORY_CARE_PROVIDER_SITE_OTHER): Payer: Medicare Other | Admitting: *Deleted

## 2014-07-06 DIAGNOSIS — I482 Chronic atrial fibrillation, unspecified: Secondary | ICD-10-CM

## 2014-07-06 DIAGNOSIS — Z5181 Encounter for therapeutic drug level monitoring: Secondary | ICD-10-CM | POA: Diagnosis not present

## 2014-07-06 DIAGNOSIS — Z7901 Long term (current) use of anticoagulants: Secondary | ICD-10-CM

## 2014-07-06 DIAGNOSIS — I4891 Unspecified atrial fibrillation: Secondary | ICD-10-CM | POA: Diagnosis not present

## 2014-07-06 LAB — POCT INR: INR: 2.6

## 2014-07-11 ENCOUNTER — Other Ambulatory Visit: Payer: Self-pay | Admitting: Cardiology

## 2014-07-27 ENCOUNTER — Ambulatory Visit (INDEPENDENT_AMBULATORY_CARE_PROVIDER_SITE_OTHER): Payer: Medicare Other | Admitting: Cardiology

## 2014-07-27 ENCOUNTER — Encounter: Payer: Self-pay | Admitting: Cardiology

## 2014-07-27 VITALS — BP 122/92 | HR 106 | Ht 69.0 in | Wt 213.0 lb

## 2014-07-27 DIAGNOSIS — I5032 Chronic diastolic (congestive) heart failure: Secondary | ICD-10-CM | POA: Diagnosis not present

## 2014-07-27 DIAGNOSIS — I482 Chronic atrial fibrillation, unspecified: Secondary | ICD-10-CM

## 2014-07-27 DIAGNOSIS — I5189 Other ill-defined heart diseases: Secondary | ICD-10-CM

## 2014-07-27 DIAGNOSIS — L139 Bullous disorder, unspecified: Secondary | ICD-10-CM

## 2014-07-27 DIAGNOSIS — R238 Other skin changes: Secondary | ICD-10-CM | POA: Diagnosis not present

## 2014-07-27 DIAGNOSIS — R0989 Other specified symptoms and signs involving the circulatory and respiratory systems: Secondary | ICD-10-CM

## 2014-07-27 DIAGNOSIS — I519 Heart disease, unspecified: Secondary | ICD-10-CM

## 2014-07-27 DIAGNOSIS — I2510 Atherosclerotic heart disease of native coronary artery without angina pectoris: Secondary | ICD-10-CM | POA: Diagnosis not present

## 2014-07-27 DIAGNOSIS — J438 Other emphysema: Secondary | ICD-10-CM

## 2014-07-27 DIAGNOSIS — R9430 Abnormal result of cardiovascular function study, unspecified: Secondary | ICD-10-CM

## 2014-07-27 NOTE — Assessment & Plan Note (Signed)
The patient has had unusual blistering skin lesions over time. The exact diagnosis has never been clear to me. This has been assessed elsewhere. This issue is stable at this time.

## 2014-07-27 NOTE — Assessment & Plan Note (Signed)
This may be the description of the skin lesions that he has been having. I do not have any further information.

## 2014-07-27 NOTE — Progress Notes (Signed)
Cardiology Office Note   Date:  07/27/2014   ID:  ARTEM BUNTE, DOB 04/13/1940, MRN [id number]  PCP:  Juliette Alcide, MD  Cardiologist:  Willa Rough, MD   Chief Complaint  Patient presents with  . Appointment    follow-up atrial fibrillation and CHF      History of Present Illness: RAFAL ARCHULETA is a 74 y.o. male who presents today to follow-up atrial fibrillation and CHF. He has severe lung disease. He is followed carefully by her pulmonary group. He is not having any chest pain. He has chronic diastolic CHF. Today I again reminded him to limit his salt intake. He says he has been doing this. He does admit that he has been drinking a lot of water. I encouraged him to decrease this to drink only moderate amounts of fluid. He is on high-dose diuretics. Recently he obtained a special chair so that he can elevate his feet at home when he is sitting.  Patient is aware that I am retiring at the end of September, 2016. He is familiar with Dr. Diona Browner and has asked for Dr. Diona Browner to follow him over time.   Past Medical History  Diagnosis Date  . COPD with emphysema     chronic Dyspnea, followed by Dr. Dennie Bible. Wright, oxygen at night started 11/2008  . Atrial fibrillation     Permanent  . Warfarin anticoagulation     Coumadin  . Right ventricular dysfunction     Right Ventricle, mild moderately dilated by Echo 06/2008  . CAD (coronary artery disease)     .MI with a Taxus stent in 2004..  /   nuclear... June, 2010.. small scar or slight ischemia inferior wall  . Diabetes mellitus   . Hyperlipemia   . Obesity   . Peripheral edema     Mild  . Hypertension     relative hypotension... August, 2011... Cozaar stopped  . Foot pain     Significant neuropathy has been evaluated at [facility].  . Ejection fraction     EF 60%, echo, 2010, technically difficult study  . Drug allergy     Plavix causes severe rash  . Pulmonary hypertension     PA pressure 45-50  mmHg, echo, June, 2013, patient hospitalized with a COPD exacerbation at that time.  . Constipation     July, 2013  . Blisters of multiple sites     Patient has blisters with clear liquid. There are 5 or more on the great toe and second toe of the right foot. There is one on the left great toe.  Marland Kitchen PAD (peripheral artery disease)     Vascular surgery consultation September, 2013  . Chronic diastolic CHF (congestive heart failure)     Patient requires diuretics.  . Peripheral arterial disease   . Neuropathy     Diabetic - feet    Past Surgical History  Procedure Laterality Date  . Rotator cuff repair      x 3  . Knee surgery    . Cataract extraction Bilateral     Patient Active Problem List   Diagnosis Date Noted  . Pre-operative cardiovascular examination 01/17/2014  . Peripheral neuropathy 01/15/2014  . Lumbar disc disease 01/15/2014  . At high risk for injury related to fall 11/07/2013  . 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 Encounter for therapeutic drug monitoring 02/04/2013  . Pain in limb- Bilateral leg and right thumb and index finger 08/13/2012  . Drug intolerance 08/11/2012  .  At high risk for falls 05/04/2012  . Pain in joint, ankle and foot 03/04/2012  . Edema of both legs 02/13/2012  . Chronic diastolic CHF (congestive heart failure)   . PAD (peripheral artery disease)   . Venous insufficiency 10/10/2011  . Blisters of multiple sites   . Chronic respiratory failure 07/08/2011  . Ejection fraction   . Pulmonary hypertension   . Drug allergy   . Constipation   . Right ventricular dysfunction   . COPD with emphysema Gold D   . Chronic atrial fibrillation   . Warfarin anticoagulation   . CAD (coronary artery disease)   . Hyperlipemia   . Obesity   . Peripheral edema   . Hypertension   . Foot pain       Current Outpatient Prescriptions  Medication Sig Dispense Refill  . albuterol (PROVENTIL) (2.5 MG/3ML) 0.083% nebulizer solution Take 3 mLs (2.5 mg total)  by nebulization every 6 (six) hours as needed for wheezing. 75 mL 12  . albuterol (VENTOLIN HFA) 108 (90 BASE) MCG/ACT inhaler Inhale 2 puffs into the lungs every 6 (six) hours as needed for wheezing. 1 Inhaler 6  . aspirin 81 MG tablet Take 81 mg by mouth daily.      . calcium elemental as carbonate (PX ANTACID MAXIMUM STRENGTH) 400 MG tablet Chew 400 mg by mouth as needed.    Marland Kitchen DIGOX 125 MCG tablet TAKE ONE-HALF TABLET BY MOUTH DAILY 45 tablet 3  . docusate sodium (STOOL SOFTENER) 100 MG capsule Take 100 mg by mouth 2 (two) times daily.    . finasteride (PROSCAR) 5 MG tablet Take 5 mg by mouth daily.  3  . furosemide (LASIX) 80 MG tablet TAKE 1 AND 1/2 TABLETS BY MOUTH IN THE MORNING AND ONE TABLET IN THE EVENING 75 tablet 3  . gabapentin (NEURONTIN) 300 MG capsule Take 4 tablets by mouth in am, 2 tablets at noon, and 4 tablets at night    . lovastatin (MEVACOR) 40 MG tablet Take 40 mg by mouth at bedtime.    . metFORMIN (GLUCOPHAGE) 500 MG tablet Take 500 mg by mouth 2 (two) times daily.     . metoprolol (LOPRESSOR) 50 MG tablet Take 25 mg by mouth 2 (two) times daily.    . Multiple Vitamins-Minerals (CENTRUM PO) Take 1 tablet by mouth daily.      Marland Kitchen NITROSTAT 0.4 MG SL tablet Place 0.4 mg under the tongue every 5 (five) minutes as needed.     . potassium chloride SA (K-DUR,KLOR-CON) 20 MEQ tablet TAKE TWO (2) TABLETS BY MOUTH TWICE DAILY 120 tablet 3  . predniSONE (DELTASONE) 10 MG tablet TAKE ONE TABLET BY MOUTH ONCE DAILY. 30 tablet 5  . SPIRIVA HANDIHALER 18 MCG inhalation capsule PLACE ONE CAPSULE IN MOUTHPIECE (HANDIHALER) AND INHALE IN MOUTH ONCE A DAY. 30 capsule 6  . SYMBICORT 160-4.5 MCG/ACT inhaler INHALE TWO PUFFS BY MOUTH TWICE DAILY 1 Inhaler 5  . tamsulosin (FLOMAX) 0.4 MG CAPS capsule Take 0.4 mg by mouth daily.    Marland Kitchen warfarin (COUMADIN) 5 MG tablet Take 1 tablet daily or as directed 45 tablet 3  . warfarin (COUMADIN) 5 MG tablet TAKE ONE TABLET BY MOUTH DAILY OR AS DIRECTED 45  tablet 4   No current facility-administered medications for this visit.    Allergies:   Clopidogrel bisulfate    Social History:  The patient  reports that he quit smoking about 16 years ago. His smoking use included Cigarettes. He has  a 80 pack-year smoking history. He has never used smokeless tobacco. He reports that he does not drink alcohol or use illicit drugs.   Family History:  The patient's family history includes Cancer in his father; Heart disease in his mother.    ROS:  Please see the history of present illness.     Patient denies fever, chills, headache, sweats, rash, change in vision, change in hearing, chest pain, cough, nausea or vomiting, urinary symptoms. All other systems are reviewed and are negative.  PHYSICAL EXAM: VS:  BP 122/92 mmHg  Pulse 106  Ht 5\' 9"  (1.753 m)  Wt 213 lb (96.616 kg)  BMI 31.44 kg/m2  SpO2 92% , Patient is stable today. He is hard of hearing. Head is atraumatic. Sclera and conjunctiva are normal per there is no jugular venous distention. Lungs reveal decreased breath sounds. There is no respiratory distress. Cardiac exam reveals an S1 and S2. The abdomen is soft. He does have 1+ bilateral peripheral edema. He has some mild healing skin lesions.  EKG:   EKG is not done today.   Recent Labs: 10/25/2013: TSH 3.650    Lipid Panel No results found for: CHOL, TRIG, HDL, CHOLHDL, VLDL, LDLCALC, LDLDIRECT    Wt Readings from Last 3 Encounters:  07/27/14 213 lb (96.616 kg)  05/15/14 209 lb (94.802 kg)  04/27/14 212 lb (96.163 kg)      Current medicines are reviewed  The patient understands his medications.     ASSESSMENT AND PLAN:

## 2014-07-27 NOTE — Patient Instructions (Signed)
Continue all current medications. Your physician wants you to follow up in:  4 months.  You will receive a reminder letter in the mail one-two months in advance.  If you don't receive a letter, please call our office to schedule the follow up appointment   

## 2014-07-27 NOTE — Assessment & Plan Note (Signed)
His ejection fraction is normal. No further workup.

## 2014-07-27 NOTE — Assessment & Plan Note (Signed)
The patient has coronary disease and received a stent in 2004. Nuclear scan in 2010 revealed slight ischemia. He is not having any symptoms. No further workup.

## 2014-07-27 NOTE — Assessment & Plan Note (Signed)
He does have some right ventricular dysfunction with moderate pulmonary hypertension. This could certainly play a role with his edema.

## 2014-07-27 NOTE — Assessment & Plan Note (Signed)
He has mild edema today. He is on high-dose diuretics for his diastolic CHF. I've continued to remind him to be careful with his salt and fluid intake. No change in therapy.

## 2014-07-27 NOTE — Assessment & Plan Note (Signed)
Atrial fibrillation has been chronic. Rate is controlled. He is anticoagulated. No further workup.

## 2014-07-27 NOTE — Assessment & Plan Note (Signed)
He has severe lung disease and is followed by the pulmonary team.

## 2014-08-09 ENCOUNTER — Other Ambulatory Visit: Payer: Self-pay | Admitting: Critical Care Medicine

## 2014-08-10 ENCOUNTER — Telehealth: Payer: Self-pay | Admitting: *Deleted

## 2014-08-10 NOTE — Telephone Encounter (Signed)
Pt was started on Septra DS bid x 10 days yesterday for infection in thumb.  Has had 2 doses.  Told pt to decrease coumadin to  daily except 2.5mg  on Friday, Sunday and Tuesday until INR check 08/17/14.  Pt verbalized understanding.

## 2014-08-17 ENCOUNTER — Ambulatory Visit (INDEPENDENT_AMBULATORY_CARE_PROVIDER_SITE_OTHER): Payer: Medicare Other | Admitting: *Deleted

## 2014-08-17 DIAGNOSIS — Z7901 Long term (current) use of anticoagulants: Secondary | ICD-10-CM | POA: Diagnosis not present

## 2014-08-17 DIAGNOSIS — I482 Chronic atrial fibrillation, unspecified: Secondary | ICD-10-CM

## 2014-08-17 DIAGNOSIS — I4891 Unspecified atrial fibrillation: Secondary | ICD-10-CM | POA: Diagnosis not present

## 2014-08-17 DIAGNOSIS — Z5181 Encounter for therapeutic drug level monitoring: Secondary | ICD-10-CM

## 2014-08-17 LAB — POCT INR: INR: 3.4

## 2014-08-29 ENCOUNTER — Ambulatory Visit (INDEPENDENT_AMBULATORY_CARE_PROVIDER_SITE_OTHER): Payer: Medicare Other | Admitting: *Deleted

## 2014-08-29 DIAGNOSIS — Z5181 Encounter for therapeutic drug level monitoring: Secondary | ICD-10-CM | POA: Diagnosis not present

## 2014-08-29 DIAGNOSIS — Z7901 Long term (current) use of anticoagulants: Secondary | ICD-10-CM

## 2014-08-29 DIAGNOSIS — I4891 Unspecified atrial fibrillation: Secondary | ICD-10-CM | POA: Diagnosis not present

## 2014-08-29 DIAGNOSIS — I482 Chronic atrial fibrillation, unspecified: Secondary | ICD-10-CM

## 2014-08-29 LAB — POCT INR: INR: 2.4

## 2014-09-26 ENCOUNTER — Ambulatory Visit (INDEPENDENT_AMBULATORY_CARE_PROVIDER_SITE_OTHER): Payer: Medicare Other | Admitting: *Deleted

## 2014-09-26 DIAGNOSIS — Z5181 Encounter for therapeutic drug level monitoring: Secondary | ICD-10-CM | POA: Diagnosis not present

## 2014-09-26 DIAGNOSIS — Z7901 Long term (current) use of anticoagulants: Secondary | ICD-10-CM | POA: Diagnosis not present

## 2014-09-26 DIAGNOSIS — I482 Chronic atrial fibrillation, unspecified: Secondary | ICD-10-CM

## 2014-09-26 DIAGNOSIS — I4891 Unspecified atrial fibrillation: Secondary | ICD-10-CM | POA: Diagnosis not present

## 2014-09-26 LAB — POCT INR: INR: 3.4

## 2014-10-09 ENCOUNTER — Other Ambulatory Visit: Payer: Self-pay | Admitting: Cardiology

## 2014-10-09 ENCOUNTER — Other Ambulatory Visit: Payer: Self-pay | Admitting: Critical Care Medicine

## 2014-10-19 ENCOUNTER — Ambulatory Visit (INDEPENDENT_AMBULATORY_CARE_PROVIDER_SITE_OTHER): Payer: Medicare Other | Admitting: *Deleted

## 2014-10-19 DIAGNOSIS — I4891 Unspecified atrial fibrillation: Secondary | ICD-10-CM | POA: Diagnosis not present

## 2014-10-19 DIAGNOSIS — Z5181 Encounter for therapeutic drug level monitoring: Secondary | ICD-10-CM | POA: Diagnosis not present

## 2014-10-19 DIAGNOSIS — Z7901 Long term (current) use of anticoagulants: Secondary | ICD-10-CM

## 2014-10-19 DIAGNOSIS — I482 Chronic atrial fibrillation, unspecified: Secondary | ICD-10-CM

## 2014-10-19 LAB — POCT INR: INR: 3.2

## 2014-10-31 ENCOUNTER — Ambulatory Visit (INDEPENDENT_AMBULATORY_CARE_PROVIDER_SITE_OTHER): Payer: Medicare Other | Admitting: Pulmonary Disease

## 2014-10-31 ENCOUNTER — Encounter: Payer: Self-pay | Admitting: Pulmonary Disease

## 2014-10-31 ENCOUNTER — Ambulatory Visit (INDEPENDENT_AMBULATORY_CARE_PROVIDER_SITE_OTHER)
Admission: RE | Admit: 2014-10-31 | Discharge: 2014-10-31 | Disposition: A | Discharge status: Home / Self Care | Payer: Medicare Other | Source: Ambulatory Visit | Attending: Pulmonary Disease | Admitting: Pulmonary Disease

## 2014-10-31 VITALS — BP 122/68 | HR 101 | Temp 97.4°F | Ht 69.0 in | Wt 198.6 lb

## 2014-10-31 DIAGNOSIS — J432 Centrilobular emphysema: Secondary | ICD-10-CM

## 2014-10-31 DIAGNOSIS — Z23 Encounter for immunization: Secondary | ICD-10-CM | POA: Diagnosis not present

## 2014-10-31 DIAGNOSIS — I2510 Atherosclerotic heart disease of native coronary artery without angina pectoris: Secondary | ICD-10-CM | POA: Diagnosis not present

## 2014-10-31 MED ORDER — PREDNISONE 10 MG PO TABS: ORAL_TABLET | ORAL | Status: DC

## 2014-10-31 NOTE — Patient Instructions (Signed)
Take 4 pills of prednisone for 3 days. Then take 3 pills of prednisone for 3 days. Then take 3 pills of prednisone for 3 days. Then take 2 pills of prednisone for 3 days.   After that continue your usual dose of prednisone.  We will order a chest X ray today.  Return to clinic in 6 months.

## 2014-10-31 NOTE — Progress Notes (Signed)
Subjective:    Patient ID: [name], male    DOB: 10/23/1940, 74 y.o.   MRN: [id number]  HPI Follow-up for COPD.  [name] is a 74 y.o. male with history of RV dysfunction, COPD Gold D, emphysema, A fib, CAD, DM 2, pulm HTN. He is a former patient of Dr. Delford FieldWright.  His complaints today are increased shortness of breath. It is not associated with any wheezing. He has chronic cough with whitish sputum production. Denies any chest pain, palpitations. Denies any fevers, chills. He continues to use Symbicort, Spiriva and supplemental oxygen. He is being followed by cardiology for his A. fib and coronary artery disease. His echocardiogram shows normal LV ejection fraction but he does have RV dysfunction with moderate pulmonary hypertension.  Data: CXR (05/15/14) COPD without acute abnormality. No change from the prior study.   Past Medical History  Diagnosis Date  . COPD with emphysema (HCC)     chronic Dyspnea, followed by Dr. Dennie BiblePat. Wright, oxygen at night started 11/2008  . Atrial fibrillation (HCC)     Permanent  . Warfarin anticoagulation     Coumadin  . Right ventricular dysfunction     Right Ventricle, mild moderately dilated by Echo 06/2008  . CAD (coronary artery disease)     .MI with a Taxus stent in 2004..  /   nuclear... June, 2010.. small scar or slight ischemia inferior wall  . Diabetes mellitus   . Hyperlipemia   . Obesity   . Peripheral edema     Mild  . Hypertension     relative hypotension... August, 2011... Cozaar stopped  . Foot pain     Significant [facility].  . Ejection fraction     EF 60%, echo, 2010, technically difficult study  . Drug allergy     Plavix causes severe rash  . Pulmonary hypertension (HCC)     PA pressure 45-50 mmHg, echo, June, 2013, patient hospitalized with a COPD exacerbation at that time.  . Constipation     July, 2013  . Blisters of multiple sites     Patient has blisters  with clear liquid. There are 5 or more on the great toe and second toe of the right foot. There is one on the left great toe.  Marland Kitchen. PAD (peripheral artery disease) [facility](HCC)     Vascular surgery consultation September, 2013  . Chronic diastolic CHF (congestive heart failure) (HCC)     Patient requires diuretics.  . Peripheral arterial disease (HCC)   . Neuropathy (HCC)     Diabetic - feet    Current outpatient prescriptions:  .  albuterol (PROVENTIL) (2.5 MG/3ML) 0.083% nebulizer solution, Take 3 mLs (2.5 mg total) by nebulization every 6 (six) hours as needed for wheezing., Disp: 75 mL, Rfl: 12 .  albuterol (VENTOLIN HFA) 108 (90 BASE) MCG/ACT inhaler, Inhale 2 puffs into the lungs every 6 (six) hours as needed for wheezing., Disp: 1 Inhaler, Rfl: 6 .  aspirin 81 MG tablet, Take 81 mg by mouth daily.  , Disp: , Rfl:  .  calcium elemental as carbonate (PX ANTACID MAXIMUM STRENGTH) 400 MG tablet, Chew 400 mg by mouth as needed., Disp: , Rfl:  .  DIGOX 125 MCG tablet, TAKE ONE-HALF TABLET BY MOUTH DAILY, Disp: 45 tablet, Rfl: 3 .  docusate sodium (STOOL SOFTENER) 100 MG capsule, Take 100 mg by mouth 2 (two) times daily., Disp: , Rfl:  .  finasteride (PROSCAR) 5  MG tablet, Take 5 mg by mouth daily., Disp: , Rfl: 3 .  furosemide (LASIX) 80 MG tablet, TAKE 1 AND 1/2 TABLETS BY MOUTH IN THE MORNING AND ONE TABLET IN THE EVENING, Disp: 75 tablet, Rfl: 1 .  gabapentin (NEURONTIN) 300 MG capsule, Take 4 tablets by mouth in am, 2 tablets at noon, and 4 tablets at night, Disp: , Rfl:  .  lovastatin (MEVACOR) 40 MG tablet, Take 40 mg by mouth at bedtime., Disp: , Rfl:  .  metFORMIN (GLUCOPHAGE) 500 MG tablet, Take 500 mg by mouth 2 (two) times daily. , Disp: , Rfl:  .  metoprolol (LOPRESSOR) 50 MG tablet, Take 25 mg by mouth 2 (two) times daily., Disp: , Rfl:  .  Multiple Vitamins-Minerals (CENTRUM PO), Take 1 tablet by mouth daily.  , Disp: , Rfl:  .  NITROSTAT 0.4 MG SL tablet, Place 0.4 mg under the tongue  every 5 (five) minutes as needed. , Disp: , Rfl:  .  potassium chloride SA (K-DUR,KLOR-CON) 20 MEQ tablet, TAKE TWO (2) TABLETS BY MOUTH TWICE DAILY, Disp: 120 tablet, Rfl: 1 .  predniSONE (DELTASONE) 10 MG tablet, TAKE ONE TABLET BY MOUTH ONCE DAILY., Disp: 30 tablet, Rfl: 5 .  SPIRIVA HANDIHALER 18 MCG inhalation capsule, PLACE ONE CAPSULE IN MOUTHPIECE (HANDIHALER) AND INHALE IN MOUTH ONCE A DAY., Disp: 30 capsule, Rfl: 6 .  SYMBICORT 160-4.5 MCG/ACT inhaler, INHALE TWO PUFFS BY MOUTH TWICE DAILY, Disp: 10.2 g, Rfl: 2 .  tamsulosin (FLOMAX) 0.4 MG CAPS capsule, Take 0.4 mg by mouth daily., Disp: , Rfl:  .  warfarin (COUMADIN) 5 MG tablet, Take 1 tablet daily or as directed, Disp: 45 tablet, Rfl: 3 .  warfarin (COUMADIN) 5 MG tablet, TAKE ONE TABLET BY MOUTH DAILY OR AS DIRECTED, Disp: 45 tablet, Rfl: 4   Review of Systems  Increased dyspnea on exertion, chronic cough with bloody sputum production. No fevers, chills, hemoptysis. No chest pain, palpitations. No nausea, vomiting, diarrhea. All other review of systems negative.     Objective:   Physical Exam Blood pressure 122/68, pulse 101, temperature 97.4 F (36.3 C), temperature source Oral, height  (1.753 m), weight 198 lb 9.6 oz (90.084 kg), SpO2 99 %.  Gen.: Pleasant elderly male. No apparent distress Neuro: No gross focal deficits. Neck: No JVD, lymphadenopathy, thyromegaly. RS: Clear. No wheeze or crackles CVS: S1-S2 heard, no murmurs rubs gallops. Abdomen: Soft, positive bowel sounds. Extremities: No edema.    Assessment & Plan:  #1 COPD. He has severe COPD, stage IV coal criteria. I don't have any recent PFTs. I will order these to get an assessment of his lung function. In the office today is complaining of increased dyspnea. But there are no symptoms suggestive of pneumonia or an acute infection. I will temporarily increase his prednisone to 20 mg and taper over the next 15 days. I'll also get a chest x-ray.   #2 RV  dysfunction, pulmonary hypertension. This is probably a result of his severe lung disease. We'll continue to monitor.  Plan: -Increase prednisone dose to 20 mg and taper. -Chest x-ray today. -Pulmonary function test.  Return to clinic in 6 months.  Chilton Greathouse MD [facility] Pager [phone] If no answer or after 3pm call: [phone] 10/31/2014, 3:50 PM

## 2014-11-03 ENCOUNTER — Other Ambulatory Visit: Payer: Self-pay | Admitting: Pulmonary Disease

## 2014-11-03 DIAGNOSIS — J690 Pneumonitis due to inhalation of food and vomit: Secondary | ICD-10-CM

## 2014-11-03 MED ORDER — AZITHROMYCIN 250 MG PO TABS: ORAL_TABLET | ORAL | Status: DC

## 2014-11-03 NOTE — Progress Notes (Signed)
Notes Recorded by Lowell BoutonJessica E Jacqualine Weichel, CMA on 11/03/2014 at 5:24 PM Called spoke with patient and discussed cxr results / recs as stated by PM. Pt voiced his understanding. Rx sent to verified pharmacy and pt will come to the office on 11.28.16 for repeat cxr. Orders only encounter for rx and cxr orders. Notes Recorded by Lowell BoutonJessica E Charistopher Rumble, CMA on 11/02/2014 at 3:20 PM ATC pt at home number, line rang multiple times with no answer and no option to LM. LMOM TCB x1 at mobile number. Notes Recorded by Chilton GreathousePraveen Mannam, MD on 11/02/2014 at 3:02 PM Please inform the patient that his CXR shows a pneumonia and that we will give him antibiotics  Please order a Z pack. Azithromycin 500 mg on day 1 and 250 mg on day 2-5. Repeat chest x ray in 4 weeks.   Drug interaction notification received when sending the zpak - discussed with SN.  [facility] at his [id number] appt that he took a zpak in the interim.  Spoke with patient, he is aware to do so.

## 2014-11-03 NOTE — Progress Notes (Signed)
Quick Note:  Called spoke with patient and discussed cxr results / recs as stated by PM. Pt voiced his understanding. Rx sent to verified pharmacy and pt will come to the office on 11.28.16 for repeat cxr. Orders only encounter for rx and cxr orders. ______

## 2014-11-07 ENCOUNTER — Other Ambulatory Visit: Payer: Self-pay | Admitting: Critical Care Medicine

## 2014-11-07 ENCOUNTER — Other Ambulatory Visit: Payer: Self-pay | Admitting: Cardiology

## 2014-11-08 ENCOUNTER — Telehealth: Payer: Self-pay | Admitting: Pulmonary Disease

## 2014-11-08 NOTE — Telephone Encounter (Signed)
Spoke with pt. States that he was diagnosed with PNA last week. Was told by our office to call back if he was not feeling any better. His symptoms have not improved and he would like to be seen. He has been scheduled to see MW tomorrow at 9:15am. He wanted a "regular" doctor, not a foreigner.

## 2014-11-08 NOTE — Telephone Encounter (Signed)
Patient called again can be reached [phone].

## 2014-11-09 ENCOUNTER — Ambulatory Visit (INDEPENDENT_AMBULATORY_CARE_PROVIDER_SITE_OTHER): Payer: Medicare Other | Admitting: Internal Medicine

## 2014-11-09 ENCOUNTER — Telehealth: Payer: Self-pay | Admitting: Internal Medicine

## 2014-11-09 ENCOUNTER — Ambulatory Visit (INDEPENDENT_AMBULATORY_CARE_PROVIDER_SITE_OTHER): Payer: Medicare Other | Admitting: *Deleted

## 2014-11-09 ENCOUNTER — Encounter: Payer: Self-pay | Admitting: Internal Medicine

## 2014-11-09 ENCOUNTER — Ambulatory Visit (INDEPENDENT_AMBULATORY_CARE_PROVIDER_SITE_OTHER)
Admission: RE | Admit: 2014-11-09 | Discharge: 2014-11-09 | Disposition: A | Discharge status: Home / Self Care | Payer: Medicare Other | Source: Ambulatory Visit | Attending: Internal Medicine | Admitting: Internal Medicine

## 2014-11-09 ENCOUNTER — Telehealth: Payer: Self-pay | Admitting: Pulmonary Disease

## 2014-11-09 VITALS — BP 122/68 | HR 78 | Ht 69.0 in | Wt 199.0 lb

## 2014-11-09 DIAGNOSIS — I482 Chronic atrial fibrillation, unspecified: Secondary | ICD-10-CM

## 2014-11-09 DIAGNOSIS — Z5181 Encounter for therapeutic drug level monitoring: Secondary | ICD-10-CM

## 2014-11-09 DIAGNOSIS — Z7901 Long term (current) use of anticoagulants: Secondary | ICD-10-CM | POA: Diagnosis not present

## 2014-11-09 DIAGNOSIS — J449 Chronic obstructive pulmonary disease, unspecified: Secondary | ICD-10-CM

## 2014-11-09 DIAGNOSIS — I2510 Atherosclerotic heart disease of native coronary artery without angina pectoris: Secondary | ICD-10-CM | POA: Diagnosis not present

## 2014-11-09 DIAGNOSIS — I4891 Unspecified atrial fibrillation: Secondary | ICD-10-CM | POA: Diagnosis not present

## 2014-11-09 LAB — POCT INR: INR: 3

## 2014-11-09 MED ORDER — TIOTROPIUM BROMIDE MONOHYDRATE 2.5 MCG/ACT IN AERS: INHALATION_SPRAY | RESPIRATORY_TRACT | Status: DC

## 2014-11-09 NOTE — Progress Notes (Signed)
Quick Note:  Spoke with pt and notified of results per Dr. Wert. Pt verbalized understanding and denied any questions.  ______ 

## 2014-11-09 NOTE — Telephone Encounter (Signed)
OK with me.

## 2014-11-09 NOTE — Progress Notes (Signed)
Quick Note:  lmtcb ______ 

## 2014-11-09 NOTE — Telephone Encounter (Signed)
lmomtcb x1 

## 2014-11-09 NOTE — Patient Instructions (Addendum)
Work on inhaler technique:  relax and gently blow all the way out then take a nice smooth deep breath back in, triggering the inhaler at same time you start breathing in.  Hold for up to 5 seconds if you can. Blow out thru nose. Rinse and gargle with water when done Practice with an empty inhaler or the respimat device without loading it first     Plan A = automatic = symbicort 160 2 pffs each am then follow with spiriva 2 puffs (change the powder to the new RESPIMAT device we did today)                                    Repeat the symbicort 12 hours later  Plan B = Backiup - Only use your albuterol inahler = [place] a rescue medication to be used if you can't catch your breath by resting or doing a relaxed purse lip breathing pattern.  - The less you use it, the better it will work when you need it. - Ok to use up to 2 puffs  every 4 hours if you must but call for immediate appointment if use goes up over your usual need - Don't leave home without it !!  (think of it like the spare tire for your car)   Plan C = crisis - use nebulizer with albuterol up to every 4 hours if you use Plan B and it fails to work for you   Please remember to go to the   x-ray department downstairs for your tests - we will call you with the results when they are available.     Please schedule a follow up office visit in 2weeks, sooner if needed with Dr Isaiah SergeMannam or Tammy NP with all active meds in hand

## 2014-11-09 NOTE — Progress Notes (Signed)
   Subjective:    Patient ID: [name], male    DOB: 02/06/1940    MRN: [id number]      Brief patient profile:   [age] yowm quit smoking in 2000 RV dysfunction, COPD Gold IV , A fib, CAD, DM 2, pulm HTN. He is a former patient of Dr. Delford FieldWright on daily prednisone x around 2011    History of Present Illness  eval by Dr Isaiah SergeMannam  10/31/14 Cc increased shortness of breath x one week . It is not associated with any wheezing. He has chronic cough with whitish sputum production.  He continues to use Symbicort, Spiriva and supplemental oxygen. He is being followed by cardiology for his A. fib and coronary artery disease. His echocardiogram shows normal LV ejection fraction but he does have RV dysfunction with moderate pulmonary hypertension. cxr ? RLL pna > rx zpack and increase pred to 20 mg daily    11/09/2014 acute extended ov/Wert re: persistent cough / congestion "my pna is no better" Chief Complaint  Patient presents with  . Acute Visit    Pt c/o of cough with white mucus, wheeze and SOB. Pt denies f/n/v/d or chills.    has flutter valve not using  Thoroughly confused re maint vs prns, has neb but also not using  Comfortable at rest, extremely sedentary. Cough worst first thing in am but mucus is white, no fever or cp , has not used symbicort yet on day of ov and hfa very poor.    No obvious day to day or daytime variability or assoc chronic cough or cp or chest tightness, subjective wheeze or overt sinus or hb symptoms. No unusual exp hx or h/o childhood pna/ asthma or knowledge of premature birth.  Sleeping ok without nocturnal  or early am exacerbation  of respiratory  c/o's or need for noct saba. Also denies any obvious fluctuation of symptoms with weather or environmental changes or other aggravating or alleviating factors except as outlined above   Current Medications, Allergies, Complete Past Medical History, Past Surgical History, Family History, and Social History were reviewed in  [facility] electronic medical record.  ROS  The following are not active complaints unless bolded sore throat, dysphagia, dental problems, itching, sneezing,  nasal congestion or excess/ purulent secretions, ear ache,   fever, chills, sweats, unintended wt loss, classically pleuritic or exertional cp, hemoptysis,  orthopnea pnd or leg swelling, presyncope, palpitations, abdominal pain, anorexia, nausea, vomiting, diarrhea  or change in bowel or bladder habits, change in stools or urine, dysuria,hematuria,  rash, arthralgias, visual complaints, headache, numbness, weakness or ataxia or problems with walking or coordination,  change in mood/affect or memory.             Objective:   Physical Exam  W/c bound wm rattling congested sounding cough  Wt Readings from Last 3 Encounters:  11/09/14 199 lb (90.266 kg)  10/31/14 198 lb 9.6 oz (90.084 kg)  07/27/14 213 lb (96.616 kg)    Vital signs reviewed    Gen.: Pleasant elderly male. No apparent distress Neuro: No gross focal deficits. Neck: No JVD, lymphadenopathy, thyromegaly. RS: no increased wob/ distant bs with insp and exp rhonchi  CVS: S1-S2 heard, no murmurs rubs gallops. Abdomen: Soft, positive bowel sounds. Extremities: No edema.     I personally reviewed images and agree with radiology impression as follows:  CXR:    11/09/2014 Copd/ no pna  Assessment & Plan:

## 2014-11-09 NOTE — Telephone Encounter (Signed)
[phone], pt cb

## 2014-11-09 NOTE — Telephone Encounter (Signed)
Spoke with the pt and notified of recs per MW  She verbalized understanding  Nothing further needed 

## 2014-11-09 NOTE — Telephone Encounter (Signed)
Please advise MW thanks 

## 2014-11-09 NOTE — Telephone Encounter (Signed)
Spoke with pt. He is requesting to change physicians from Dr. Isaiah SergeMannam to Dr. Sherene SiresWert.  Please advise Dr. Isaiah SergeMannam if okay? thanks

## 2014-11-10 NOTE — Assessment & Plan Note (Addendum)
oxygen at night started 11/2008  PFT's 08/24/06  FEV1  1.30(43%) ratio 36 and no change p saba and dlco 56 corrects to 94 for alv vol    DDX of  difficult airways management all start with A and  include Adherence, Ace Inhibitors, Acid Reflux, Active Sinus Disease, Alpha 1 Antitripsin deficiency, Anxiety masquerading as Airways dz,  ABPA,  allergy(esp in young), Aspiration (esp in elderly), Adverse effects of meds,  Active smokers, A bunch of PE's (a small clot burden can't cause this syndrome unless there is already severe underlying pulm or vascular dz with poor reserve) plus two Bs  = Bronchiectasis and Beta blocker use..and one C= CHF  Adherence is always the initial "prime suspect" and is a multilayered concern that requires a "trust but verify" approach in every patient - starting with knowing how to use medications, especially inhalers, correctly, keeping up with refills and understanding the fundamental difference between maintenance and prns vs those medications only taken for a very short course and then stopped and not refilled.  -The proper method of use, as well as anticipated side effects, of a metered-dose inhaler are discussed and demonstrated to the patient. Improved effectiveness after extensive coaching during this visit to a level of approximately  75% with respimat/ 50% with hfa and may need to change to laba/lama neb if not improving - Hearing is also very poor as well as understanding of how/ when to use maint vs prns, reviewed extensively with son also  ?alleryg/ asthma component suggested by reported steroid dep > The goal with a chronic steroid dependent illness is always arriving at the lowest effective dose that controls the disease/symptoms and not accepting a set "formula" which is based on statistics or guidelines that don't always take into account patient  variability or the natural hx of the dz in every individual patient, which may well vary over time.  For now therefore  I recommend the patient maintain  Ceiling of 20 mg and floor of 10 mg per day for now  No evidence of pna so no further abx needed   I had an extended discussion with the patient reviewing all relevant studies completed to date and  lasting 25  minutes of a 40 minute visit    Each maintenance medication was reviewed in detail including most importantly the difference between maintenance and prns and under what circumstances the prns are to be triggered using an action plan format that is not reflected in the computer generated alphabetically organized AVS.    Please see instructions for details which were reviewed in writing and the patient given a copy highlighting the part that I personally wrote and discussed at today's ov.

## 2014-11-10 NOTE — Telephone Encounter (Signed)
Ok with me,too but will need to bring every med in - he needs to be seen in 2 weeks by Tammy then back to me

## 2014-11-10 NOTE — Telephone Encounter (Signed)
lmomtcb x1 for pt 

## 2014-11-13 NOTE — Telephone Encounter (Signed)
Left detailed message on voicemail advising patient that he needs to call our office to schedule appointment to see Rubye Oaksammy Parrett, NP and to bring all medications at that appointment.  Awaiting call back from patient to schedule appointment

## 2014-11-14 NOTE — Telephone Encounter (Signed)
Called and spoke to pt. Appt made with TP on 11/28/14. Advised pt to bring in medications to OV. Pt verbalized understanding and denied any further questions or concerns at this time.

## 2014-11-23 ENCOUNTER — Ambulatory Visit: Payer: Medicare Other | Admitting: Adult Health

## 2014-11-27 ENCOUNTER — Ambulatory Visit: Payer: Medicare Other | Admitting: Internal Medicine

## 2014-11-28 ENCOUNTER — Encounter: Payer: Self-pay | Admitting: Adult Health

## 2014-11-28 ENCOUNTER — Ambulatory Visit (INDEPENDENT_AMBULATORY_CARE_PROVIDER_SITE_OTHER): Payer: Medicare Other | Admitting: Adult Health

## 2014-11-28 ENCOUNTER — Telehealth: Payer: Self-pay | Admitting: Adult Health

## 2014-11-28 VITALS — BP 126/74 | HR 81 | Temp 97.5°F | Ht 69.0 in | Wt 200.0 lb

## 2014-11-28 DIAGNOSIS — J9611 Chronic respiratory failure with hypoxia: Secondary | ICD-10-CM

## 2014-11-28 DIAGNOSIS — I2510 Atherosclerotic heart disease of native coronary artery without angina pectoris: Secondary | ICD-10-CM

## 2014-11-28 DIAGNOSIS — J449 Chronic obstructive pulmonary disease, unspecified: Secondary | ICD-10-CM | POA: Diagnosis not present

## 2014-11-28 NOTE — Telephone Encounter (Signed)
I really don't know him well enough to suggest an alternative and we should refer him to [facility] if he refuses to see our non-white doctors

## 2014-11-28 NOTE — Telephone Encounter (Signed)
Patient was a former [facility] pt who was seen by Dr. Silvestre GunnerMannan on 10/31/14 for an acute office visit. After that visit with PM he requested that he be placed with a different MD per telephone note from 11/09/14. Patient was then switched to MW on 11/09/14 for a follow up. MW's recommendations were to follow up with TP on 11/28/14. At ov with TP on 11/28/14 pt requested that he no longer see MW or PM and stated that he does not want to be placed with a "foreign doctor". Per TP's recommendations at ov the patient needs a 6 month follow up. MW please advise if okay to switch providers and please specify provider, thanks.

## 2014-11-28 NOTE — Patient Instructions (Signed)
Continue on current regimen .  Follow up in 6 months and As needed    

## 2014-11-29 NOTE — Telephone Encounter (Signed)
Called spoke with pt. He refuses to be referred [facility]. He reports it should not be held against him bc he wants to see an "[place] doctor". He reports he wants to see someone he can understand. Pt requesting we call him next week with a new doctor for him to see here at our office.  I went and spoke with Katie. She will follow up on this Monday and message is being routed to her

## 2014-12-03 NOTE — Progress Notes (Signed)
   Subjective:    Patient ID: [name], male    DOB: 08/13/1940    MRN: [id number]  Brief patient profile:   [age] yowm quit smoking in 2000 RV dysfunction, COPD Gold IV , A fib, CAD, DM 2, pulm HTN. He is a former patient of [name] on daily prednisone x around 2011    History of Present Illness  eval by [name]  10/31/14 Cc increased shortness of breath x one week . It is not associated with any wheezing. He has chronic cough with whitish sputum production.  He continues to use Symbicort, Spiriva and supplemental oxygen. He is being followed by cardiology for his A. fib and coronary artery disease. His echocardiogram shows normal LV ejection fraction but he does have RV dysfunction with moderate pulmonary hypertension. cxr ? RLL pna > rx zpack and increase pred to 20 mg daily    11/09/2014 acute extended ov/[name] re: persistent cough / congestion "my pna is no better" Chief Complaint  Patient presents with  . Acute Visit    Pt c/o of cough with white mucus, wheeze and SOB. Pt denies f/n/v/d or chills.    has flutter valve not using  Thoroughly confused re maint vs prns, has neb but also not using  Comfortable at rest, extremely sedentary. Cough worst first thing in am but mucus is white, no fever or cp , has not used symbicort yet on day of ov and hfa very poor.  >cxr with scarring , no acute process .    11/28/14 Follow up Pt returns for 3 week follow up . Pt has had a slow to resolve COPD flare.  He is feeling better. Has some lingering DOE and dry cough . Has occasional wheezing.  Denies any sinus pressure/drainage, chest tightness/congestion, fever, nausea or vomiting.  Remains on Spiriva and Symbicort.  CXR last ov with COPD changes, no acute process.   Pt is previous patient of [name]  , he has been changed to Peter. [name]. He requests to not see him  any longer. He was then seen by [name]  Who is also requests not to see. He has requested to see a non foreign  doctor.    Current Medications, Allergies, Complete Past Medical History, Past Surgical History, Family History, and Social History were reviewed in [facility] electronic medical record.  ROS  The following are not active complaints unless bolded sore throat, dysphagia, dental problems, itching, sneezing,  nasal congestion or excess/ purulent secretions, ear ache,   fever, chills, sweats, unintended wt loss, classically pleuritic or exertional cp, hemoptysis,  orthopnea pnd or leg swelling, presyncope, palpitations, abdominal pain, anorexia, nausea, vomiting, diarrhea  or change in bowel or bladder habits, change in stools or urine, dysuria,hematuria,  rash, arthralgias, visual complaints, headache, numbness, weakness or ataxia or problems with walking or coordination,  change in mood/affect or memory.             Objective:   Physical Exam  W/c bound wm   Vital signs reviewed  Gen.: Pleasant elderly male. No apparent distress Neuro: No gross focal deficits. Neck: No JVD, lymphadenopathy, thyromegaly. RS: no increased wob/ decreased BS in bases  CVS: S1-S2 heard, no murmurs rubs gallops. Abdomen: Soft, positive bowel sounds. Extremities: No edema.    CXR:    11/09/2014 Copd/ no pna  Assessment & Plan:

## 2014-12-04 ENCOUNTER — Encounter: Payer: Self-pay | Admitting: Adult Health

## 2014-12-04 NOTE — Assessment & Plan Note (Signed)
Resolving flare , cont on current regimen

## 2014-12-04 NOTE — Assessment & Plan Note (Signed)
Cont on O2 .  

## 2014-12-05 ENCOUNTER — Ambulatory Visit (INDEPENDENT_AMBULATORY_CARE_PROVIDER_SITE_OTHER): Payer: Medicare Other | Admitting: *Deleted

## 2014-12-05 DIAGNOSIS — Z7901 Long term (current) use of anticoagulants: Secondary | ICD-10-CM | POA: Diagnosis not present

## 2014-12-05 DIAGNOSIS — I4891 Unspecified atrial fibrillation: Secondary | ICD-10-CM

## 2014-12-05 DIAGNOSIS — I482 Chronic atrial fibrillation, unspecified: Secondary | ICD-10-CM

## 2014-12-05 DIAGNOSIS — Z5181 Encounter for therapeutic drug level monitoring: Secondary | ICD-10-CM

## 2014-12-05 LAB — POCT INR: INR: 1.9

## 2014-12-05 NOTE — Telephone Encounter (Signed)
Had another MD in the office look through patients chart to suggest another MD for pt to see. BQ and JN were suggested for pt. JN has the soonest opening in 01-23-15 to see patient as the other MD did not feel patient should wait until 6 months for OV with newer MD.    Paula LibraJN please advise if you are okay with taking patient. Thanks.

## 2014-12-05 NOTE — Telephone Encounter (Signed)
Spoke with patient-he agreed to see JN and appt has been made for Tuesday 01/23/15 at 3:15pm. Pt is aware that if he needs to be seen sooner to contact our office-he may not be able to see Jn but we can get him in with another MD or NP.   Nothing more needed at this time.

## 2014-12-05 NOTE — Telephone Encounter (Signed)
That's fine with me. I can see him. Just as long as he understands he may not be able to see me every time if he needs an acute visit appointment. JN

## 2014-12-11 ENCOUNTER — Other Ambulatory Visit: Payer: Self-pay | Admitting: Cardiology

## 2014-12-26 ENCOUNTER — Ambulatory Visit (INDEPENDENT_AMBULATORY_CARE_PROVIDER_SITE_OTHER): Payer: Medicare Other | Admitting: *Deleted

## 2014-12-26 DIAGNOSIS — I482 Chronic atrial fibrillation, unspecified: Secondary | ICD-10-CM

## 2014-12-26 DIAGNOSIS — Z7901 Long term (current) use of anticoagulants: Secondary | ICD-10-CM | POA: Diagnosis not present

## 2014-12-26 DIAGNOSIS — Z5181 Encounter for therapeutic drug level monitoring: Secondary | ICD-10-CM

## 2014-12-26 DIAGNOSIS — I4891 Unspecified atrial fibrillation: Secondary | ICD-10-CM | POA: Diagnosis not present

## 2014-12-26 LAB — POCT INR: INR: 1.4

## 2015-01-05 ENCOUNTER — Encounter: Payer: Self-pay | Admitting: Cardiology

## 2015-01-05 ENCOUNTER — Ambulatory Visit (INDEPENDENT_AMBULATORY_CARE_PROVIDER_SITE_OTHER): Payer: Medicare Other | Admitting: Cardiology

## 2015-01-05 VITALS — BP 113/79 | HR 83 | Ht 69.0 in | Wt 200.8 lb

## 2015-01-05 DIAGNOSIS — I2510 Atherosclerotic heart disease of native coronary artery without angina pectoris: Secondary | ICD-10-CM

## 2015-01-05 DIAGNOSIS — I10 Essential (primary) hypertension: Secondary | ICD-10-CM

## 2015-01-05 DIAGNOSIS — I482 Chronic atrial fibrillation, unspecified: Secondary | ICD-10-CM

## 2015-01-05 DIAGNOSIS — I2781 Cor pulmonale (chronic): Secondary | ICD-10-CM

## 2015-01-05 NOTE — Progress Notes (Signed)
Cardiology Office Note  Date: 01/05/2015   ID: [name], DOB 1940-06-15, MRN [id number]  PCP: Juliette Alcide, MD  Primary Cardiologist: Nona Dell, MD   Chief Complaint  Patient presents with  . Coronary Artery Disease  . Atrial Fibrillation    History of Present Illness: [name] is a 74 y.o. male former patient of Dr. Myrtis Ser last seen in July, now establishing follow-up with me. I reviewed extensive records and updated his chart. This is our first meeting in the office. He is here today with his wife. From a cardiac perspective, he does not report any regular sense of palpitations or chest pain.  He continues on Coumadin with follow-up in the anticoagulation clinic. No unusual bleeding problems. He has been on Coumadin long-term, did have some questions about DOACs today, but did not want to make a switch following discussion.  ECG today shows rate-controlled atrial fibrillation with nonspecific ST changes.  Last cardiac structural and ischemic testing is outlined below.  He uses a rolling walker, is somewhat limited in terms of his activity. He does not use oxygen during the daytime, only at nighttime. He maintains regular follow-up in the Pulmonary division, has had COPD exacerbation within the last few months.  Current cardiac regimen includes aspirin, Lasix, Lopressor, potassium supplements, Mevacor, and Coumadin.  Past Medical History  Diagnosis Date  . COPD with emphysema (HCC)     Followed by Pulmonary  . Chronic atrial fibrillation (HCC)   . Right ventricular dysfunction   . CAD (coronary artery disease)     DES to RCA 2004, moderate residual LAD diisease  . Hyperlipemia   . Obesity   . Hypertension   . Drug allergy     Plavix causes severe rash  . Secondary pulmonary hypertension (HCC)     Moderate in setting of COPD  . Chronic diastolic CHF (congestive heart failure) (HCC)   . Peripheral arterial disease (HCC)   . Type 2 diabetes mellitus  with diabetic neuropathy [facility])     Past Surgical History  Procedure Laterality Date  . Rotator cuff repair      x 3  . Knee surgery    . Cataract extraction Bilateral     Current Outpatient Prescriptions  Medication Sig Dispense Refill  . albuterol (PROVENTIL) (2.5 MG/3ML) 0.083% nebulizer solution Take 3 mLs (2.5 mg total) by nebulization every 6 (six) hours as needed for wheezing. 75 mL 12  . albuterol (VENTOLIN HFA) 108 (90 BASE) MCG/ACT inhaler Inhale 2 puffs into the lungs every 6 (six) hours as needed for wheezing. 1 Inhaler 6  . aspirin 81 MG tablet Take 81 mg by mouth daily.      . calcium elemental as carbonate (PX ANTACID MAXIMUM STRENGTH) 400 MG tablet Chew 400 mg by mouth as needed.    Marland Kitchen DIGOX 125 MCG tablet TAKE ONE-HALF TABLET BY MOUTH DAILY 45 tablet 3  . docusate sodium (STOOL SOFTENER) 100 MG capsule Take 100 mg by mouth 2 (two) times daily.    . finasteride (PROSCAR) 5 MG tablet Take 5 mg by mouth daily.  3  . furosemide (LASIX) 80 MG tablet Take 80 mg by mouth. 1/ 1/2 in AM 1 PM    . gabapentin (NEURONTIN) 600 MG tablet Take 2 tablets by mouth 3 (three) times daily.  6  . lovastatin (MEVACOR) 40 MG tablet Take 40 mg by mouth at bedtime.    . metFORMIN (GLUCOPHAGE) 500 MG tablet Take 500 mg by  mouth 2 (two) times daily.     . metoprolol (LOPRESSOR) 50 MG tablet Take 25 mg by mouth 2 (two) times daily.    . Multiple Vitamins-Minerals (CENTRUM PO) Take 1 tablet by mouth daily.      Marland Kitchen. NITROSTAT 0.4 MG SL tablet Place 0.4 mg under the tongue every 5 (five) minutes as needed.     . potassium chloride SA (K-DUR,KLOR-CON) 20 MEQ tablet TAKE TWO (2) TABLETS BY MOUTH TWICE DAILY need appointment with Dr Diona BrownerMcDowell 11/2014 120 tablet 3  . SYMBICORT 160-4.5 MCG/ACT inhaler INHALE TWO PUFFS BY MOUTH TWICE DAILY 10.2 g 2  . tamsulosin (FLOMAX) 0.4 MG CAPS capsule Take 0.4 mg by mouth daily.    . Tiotropium Bromide Monohydrate (SPIRIVA RESPIMAT) 2.5 MCG/ACT AERS 2 pff each am    .  warfarin (COUMADIN) 5 MG tablet Take 1 tablet daily except 1/2 tablet on Mondays and Thursdays 45 tablet 4   No current facility-administered medications for this visit.   Allergies:  Clopidogrel bisulfate   Social History: The patient  reports that he quit smoking about 17 years ago. His smoking use included Cigarettes. He has a 80 pack-year smoking history. He has never used smokeless tobacco. He reports that he does not drink alcohol or use illicit drugs.   ROS:  Please see the history of present illness. Otherwise, complete review of systems is positive for diminished hearing, uses hearing aids. NYHA class III dyspnea.  All other systems are reviewed and negative.   Physical Exam: VS:  BP 113/79 mmHg  Pulse 83  Ht 5\' 9"  (1.753 m)  Wt 200 lb 12.8 oz (91.082 kg)  BMI 29.64 kg/m2  SpO2 98%, BMI Body mass index is 29.64 kg/(m^2).  Wt Readings from Last 3 Encounters:  01/05/15 200 lb 12.8 oz (91.082 kg)  11/28/14 200 lb (90.719 kg)  11/09/14 199 lb (90.266 kg)    General: Chronically ill-appearing male in no distress. HEENT: Conjunctiva and lids normal, oropharynx clear. Neck: Supple, no elevated JVP or carotid bruits, no thyromegaly. Lungs: Decreased breath sounds throughout without active wheezing, nonlabored breathing at rest. Cardiac: Distant, irregularly irregular, no S3, soft systolic murmur, no pericardial rub. Abdomen: Soft, nontender, bowel sounds present, no guarding or rebound. Extremities: Chronic pedal edema and venous stasis, distal pulses 1-2+. Skin: Warm and dry. Musculoskeletal: No kyphosis. Neuropsychiatric: Alert and oriented x3, affect grossly appropriate. Decreased hearing.  ECG:  Tracing from 01/17/2014 showed atrial fibrillation.  Recent Labwork:  INR 1.4 on December 20.  Other Studies Reviewed Today:  Echocardiogram 06/19/2011 [facility] revealed mild LVH with LVEF 55-60%, mild left atrial enlargement, moderately reduced RV contraction, sclerotic aortic  valve, RVSP estimated 45-50 mmHg.  Lexiscan Cardiolite 06/20/2008 [facility]([place] reported slight inferior wall ischemia with LVEF 63%.  Assessment and Plan:  1. Chronic atrial fibrillation, relatively asymptomatic in terms of palpitations. Continue strategy of heart rate control and anticoagulation. He remains on Lopressor and Coumadin with follow-up in the anticoagulation clinic.  2. CAD status post DES to the RCA in 2004 with moderate residual LAD disease. He is not reporting any angina symptoms. Although last ischemic testing was in 2010, we have discussed the situation and will plan to continue with observation for now unless symptoms intervene. Continue medical therapy including beta blocker and statin. He has nitroglycerin available.  3. History of allergy to Plavix.  4. COPD with cor pulmonale, RV dysfunction with moderate pulmonary hypertension based on prior testing. He continued to follow in the Pulmonary division. Remains  on relatively high-dose diuretics for management of edema.  5. Essential hypertension, blood pressure is normal today.  Current medicines were reviewed with the patient today.   Orders Placed This Encounter  Procedures  . EKG 12-Lead    Disposition: FU with me in 6 months.   Signed, Jonelle Sidle, MD, [facility] 01/05/2015 5:08 PM    [facility] [address] [place], [place], [place] [address] Phone: [phone]; Fax: [phone]

## 2015-01-05 NOTE — Patient Instructions (Signed)

## 2015-01-11 ENCOUNTER — Ambulatory Visit (INDEPENDENT_AMBULATORY_CARE_PROVIDER_SITE_OTHER): Payer: Medicare Other | Admitting: *Deleted

## 2015-01-11 DIAGNOSIS — Z7901 Long term (current) use of anticoagulants: Secondary | ICD-10-CM

## 2015-01-11 DIAGNOSIS — I4891 Unspecified atrial fibrillation: Secondary | ICD-10-CM

## 2015-01-11 DIAGNOSIS — Z5181 Encounter for therapeutic drug level monitoring: Secondary | ICD-10-CM

## 2015-01-11 DIAGNOSIS — I482 Chronic atrial fibrillation, unspecified: Secondary | ICD-10-CM

## 2015-01-11 LAB — POCT INR: INR: 2.2

## 2015-01-22 ENCOUNTER — Telehealth: Payer: Self-pay | Admitting: Pulmonary Disease

## 2015-01-22 NOTE — Telephone Encounter (Signed)
PFT 08/24/06:  FVC 3.15 L (71%) FEV1 1.29 L (43%) FEV1/FVC 0.41 FEF 25-75 0.34 L (12%) positive bronchodilator response TLC 6.79 L (104%) RV 145% ERV 35% DLCO uncorrected 56%  IMAGING CXR PA/LAT 11/09/14 (personally reviewed by me):  No focal opacity or effusion appreciated. Mild hyperinflation with flattening of the diaphragms. Heart normal in size & mediastinum normal in contour.   LABS 10/25/13 ANA:  Negative RF:  10.9

## 2015-01-23 ENCOUNTER — Other Ambulatory Visit: Payer: Medicare Other

## 2015-01-23 ENCOUNTER — Ambulatory Visit (INDEPENDENT_AMBULATORY_CARE_PROVIDER_SITE_OTHER): Payer: Medicare Other | Admitting: Pulmonary Disease

## 2015-01-23 ENCOUNTER — Encounter: Payer: Self-pay | Admitting: Pulmonary Disease

## 2015-01-23 VITALS — BP 118/70 | HR 75 | Ht 69.0 in | Wt 197.6 lb

## 2015-01-23 DIAGNOSIS — J449 Chronic obstructive pulmonary disease, unspecified: Secondary | ICD-10-CM

## 2015-01-23 DIAGNOSIS — I272 Other secondary pulmonary hypertension: Secondary | ICD-10-CM | POA: Diagnosis not present

## 2015-01-23 DIAGNOSIS — J9611 Chronic respiratory failure with hypoxia: Secondary | ICD-10-CM

## 2015-01-23 DIAGNOSIS — E118 Type 2 diabetes mellitus with unspecified complications: Secondary | ICD-10-CM | POA: Insufficient documentation

## 2015-01-23 DIAGNOSIS — N400 Enlarged prostate without lower urinary tract symptoms: Secondary | ICD-10-CM | POA: Insufficient documentation

## 2015-01-23 MED ORDER — PREDNISONE 10 MG PO TABS: 10.0000 mg | ORAL_TABLET | Freq: Every day | ORAL | Status: DC

## 2015-01-23 NOTE — Progress Notes (Signed)
Subjective:    Patient ID: [name], male    DOB: 03/10/1940, 75 y.o.   MRN: [id number]  C.C.:  Follow-up for Severe COPD, Pulmonary Hypertension, Chronic Hypoxic Respiratory Failure, & Chronic Steroid Therapy.  HPI Severe COPD: Currently prescribed Spiriva & Symbicort. He reports compliance with his regimen. He nebulizer occasionally, once a month. He reports maybe once a month he wakes up unable to breath at night. Reports a chronic cough productive of a white mucus. He also has chronic wheezing. Last exacerbation was in November 2016. He feels his breathing has improved. He reports extremely rare exacerbations.  Pulmonary Hypertension: Multifactorial from known diastolic congestive heart failure as well as COPD. On chronic anticoagulation with Coumadin for atrial fibrillation. Currently on Lasix. Echocardiogram from 2013 showed mild dilation of the RV with reduction in systolic function but with poorly visualized RV. He continues to have edema in his lower extremities. He reports rare near syncope but has had no overt syncope.   Chronic Hypoxic Respiratory Failure: Currently prescribed oxygen at 3 L/m during the day along with 2 L/m at night while sleeping. He reports he only uses oxygen during the day if he has dyspnea.   Chronic Steroid Therapy:  Patient reports he has been on Prednisone  daily. He has been on Prednisone since around 2011.   Review of Systems No chest pain or pressure. No fever, chills, or sweats.  Allergies  Allergen Reactions  . Clopidogrel Bisulfate     REACTION: rash    Current Outpatient Prescriptions on File Prior to Visit  Medication Sig Dispense Refill  . albuterol (PROVENTIL) (2.5 MG/3ML) 0.083% nebulizer solution Take 3 mLs (2.5 mg total) by nebulization every 6 (six) hours as needed for wheezing. 75 mL 12  . albuterol (VENTOLIN HFA) 108 (90 BASE) MCG/ACT inhaler Inhale 2 puffs into the lungs every 6 (six) hours as needed for wheezing. 1 Inhaler 6   . aspirin 81 MG tablet Take 81 mg by mouth daily.      . calcium elemental as carbonate (PX ANTACID MAXIMUM STRENGTH) 400 MG tablet Chew 400 mg by mouth as needed.    Marland Kitchen DIGOX 125 MCG tablet TAKE ONE-HALF TABLET BY MOUTH DAILY 45 tablet 3  . docusate sodium (STOOL SOFTENER) 100 MG capsule Take 100 mg by mouth 2 (two) times daily.    . finasteride (PROSCAR) 5 MG tablet Take 5 mg by mouth daily.  3  . furosemide (LASIX) 80 MG tablet 1 1/2 in AM 1 PM    . gabapentin (NEURONTIN) 600 MG tablet Take 2 tablets by mouth 3 (three) times daily.  6  . lovastatin (MEVACOR) 40 MG tablet Take 40 mg by mouth at bedtime.    . metFORMIN (GLUCOPHAGE) 500 MG tablet Take 500 mg by mouth 2 (two) times daily.     . metoprolol (LOPRESSOR) 50 MG tablet Take 25 mg by mouth 2 (two) times daily.    . Multiple Vitamins-Minerals (CENTRUM PO) Take 1 tablet by mouth daily.      Marland Kitchen NITROSTAT 0.4 MG SL tablet Place 0.4 mg under the tongue every 5 (five) minutes as needed.     . potassium chloride SA (K-DUR,KLOR-CON) 20 MEQ tablet TAKE TWO (2) TABLETS BY MOUTH TWICE DAILY need appointment with Dr Diona Browner 11/2014 120 tablet 3  . SYMBICORT 160-4.5 MCG/ACT inhaler INHALE TWO PUFFS BY MOUTH TWICE DAILY 10.2 g 2  . tamsulosin (FLOMAX) 0.4 MG CAPS capsule Take 0.4 mg by mouth daily.    Marland Kitchen  warfarin (COUMADIN) 5 MG tablet Take 1 tablet daily except 1/2 tablet on Mondays and Thursdays 45 tablet 4   No current facility-administered medications on file prior to visit.    Past Medical History  Diagnosis Date  . COPD with emphysema (HCC)     Followed by Pulmonary  . Chronic atrial fibrillation (HCC)   . Right ventricular dysfunction   . CAD (coronary artery disease)     DES to RCA 2004, moderate residual LAD diisease  . Hyperlipemia   . Obesity   . Hypertension   . Drug allergy     Plavix causes severe rash  . Secondary pulmonary hypertension (HCC)     Moderate in setting of COPD  . Chronic diastolic CHF (congestive heart  failure) (HCC)   . Peripheral arterial disease (HCC)   . Type 2 diabetes mellitus with diabetic neuropathy [facility])     Past Surgical History  Procedure Laterality Date  . Rotator cuff repair      x 3  . Knee surgery    . Cataract extraction Bilateral   . Coronary stent placement  2004    Family History  Problem Relation Age of Onset  . Heart disease Mother   . Stroke Mother   . Throat cancer Father   . Lung disease Neg Hx     Social History   Social History  . Marital Status: Married    Spouse Name: N/A  . Number of Children: 3  . Years of Education: hs   Occupational History  . Dance movement psychotherapist    Social History Main Topics  . Smoking status: Former Smoker -- 2.00 packs/day for 48 years    Types: Cigarettes    Start date: 02/14/1956    Quit date: 01/06/1998  . Smokeless tobacco: Never Used  . Alcohol Use: No     Comment: Remote history of use (beer)  . Drug Use: No  . Sexual Activity: Not Asked   Other Topics Concern  . None   Social History Narrative   Originally from [place]. Previously drove a truck and has traveled to [place], [place], [place], & [place]. No international travel. Previously worked for [employer] in [place]. Exposed to hot asphalt. Has a dog & cat. No bird, hot tub or mold exposure. Previously was playing golf before his joint pain became significant.       Objective:   Physical Exam BP 118/70 mmHg  Pulse 75  Ht  (1.753 m)  Wt 197 lb 9.6 oz (89.631 kg)  BMI 29.17 kg/m2  SpO2 97% General:  Awake. Alert. No acute distress. Wife accompanying the patient today.  Integument:  Warm & dry. No rash on exposed skin.  HEENT:  Tacky mucus membranes. No oral ulcers. No scleral injection or icterus.  Cardiovascular:  Regular rate. Bilateral pedal edema. Normal S1 & S2. Pulmonary:  Symmetrically decreased breath sounds but otherwise clear to auscultation. Symmetric chest wall expansion. No accessory muscle use on room air. Abdomen: Soft. Normal bowel sounds.  Nondistended.  Musculoskeletal:  Normal bulk and tone. 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  PFT 08/24/06: FVC 3.15 L (71%) FEV1 1.29 L (43%) FEV1/FVC 0.41 FEF 25-75 0.34 L (12%) positive bronchodilator response TLC 6.79 L (104%) RV 145% ERV 35% DLCO uncorrected 56%  IMAGING CXR PA/LAT 11/09/14 (personally reviewed by me): No focal opacity or effusion appreciated. Mild hyperinflation with flattening of the diaphragms. Heart normal in size & mediastinum normal in contour.  CARDIAC TTE (06/19/11): LV normal in size with mild concentric LVH. EF 55-60%. LA & RA mildly dilated. RV not well visualized. Mild RV dilation with moderately reduced systolic function. RVSP 49 mmHg. No aortic stenosis or regurgitation. No mitral stenosis or regurgitation. No tricuspid regurgitation. No pulmonic regurgitation. No pericardial effusion. Main pulmonary artery not well visualized.  LABS 10/25/13 ANA: Negative RF: 10.9    Assessment & Plan:  89 -year-old Caucasian male with long-standing prior history of tobacco use. Patient has severe COPD based on my review of his spirometry from 2008. He does not appear to have been screened for alpha-1 antitrypsin deficiency yet. Also in reviewing the patient's prior test results he does have the suggestion of pulmonary hypertension with some right ventricular dysfunction on echocardiogram. This is likely multifactorial from his chronic hypoxic respiratory failure, COPD, and left ventricular diastolic dysfunction. The patient does follow with cardiology but I can see no prior documentation of a right heart catheterization. Patient does have a remote left heart catheterization and stent placement. Previously evaluated in 2015 with a negative ANA which makes an autoimmune etiology for his pulmonary hypertension unlikely. Overall the patient seems to have recovered well from his COPD exacerbation and continues on his chronic steroid therapy. I spent  a significant amount of time today reviewing the patient's medical history and discussing his multiple medical problems. I instructed the patient contact my office if he had any new breathing problems before his next appointment.  1. Severe COPD: Screening for alpha-1 antitrypsin deficiency. Continue patient on Symbicort & Spiriva without any changes at this time. Checking full pulmonary function testing at follow-up appointment. 2. Pulmonary hypertension: Multifactorial. Patient continuing on diuretic therapy and systemic anticoagulation per cardiology recommendations. Holding off on transthoracic echocardiogram at this time but we may need to repeat this to reassess his right ventricular function. Prior autoimmune workup negative with ANA and rheumatoid factor from 2015. Holding off on initiating pulmonary arterial vasodilator therapy at this time. 3. Chronic hypoxic respiratory failure: Patient continuing on oxygen therapy as prescribed. Reeducated on the need for use of oxygen with ambulation.  4. Health maintenance:  Influenza October 2016, Prevnar 11/11/12, & Pneumovax 10/28/07. 5. Follow-up: Patient to return to clinic in 3-4 months or sooner if needed.

## 2015-01-23 NOTE — Patient Instructions (Signed)
1. Continue using your inhalers and medications as prescribed.  2. We will do a breathing test at your next appointment. 3. I will see you back in 3-4 months or sooner if needed. Call me if you have any new breathing problems before then.  TESTS ORDERED: 1. Full pulmonary function testing at follow-up appointment 2. Alpha-1 antitrypsin phenotype today

## 2015-01-27 LAB — ALPHA-1 ANTITRYPSIN PHENOTYPE: A-1 Antitrypsin: 154 mg/dL (ref 83–199)

## 2015-02-01 ENCOUNTER — Ambulatory Visit (INDEPENDENT_AMBULATORY_CARE_PROVIDER_SITE_OTHER): Payer: Medicare Other | Admitting: *Deleted

## 2015-02-01 DIAGNOSIS — I482 Chronic atrial fibrillation, unspecified: Secondary | ICD-10-CM

## 2015-02-01 DIAGNOSIS — Z7901 Long term (current) use of anticoagulants: Secondary | ICD-10-CM | POA: Diagnosis not present

## 2015-02-01 DIAGNOSIS — Z5181 Encounter for therapeutic drug level monitoring: Secondary | ICD-10-CM | POA: Diagnosis not present

## 2015-02-01 DIAGNOSIS — I4891 Unspecified atrial fibrillation: Secondary | ICD-10-CM

## 2015-02-01 LAB — POCT INR: INR: 3.6

## 2015-02-11 ENCOUNTER — Encounter (HOSPITAL_COMMUNITY): Payer: Self-pay | Admitting: *Deleted

## 2015-02-11 ENCOUNTER — Emergency Department (HOSPITAL_COMMUNITY)
Admission: EM | Admit: 2015-02-11 | Discharge: 2015-02-11 | Disposition: A | Discharge status: Home / Self Care | Payer: Medicare Other | Attending: Emergency Medicine | Admitting: Emergency Medicine

## 2015-02-11 ENCOUNTER — Emergency Department (HOSPITAL_COMMUNITY): Payer: Medicare Other

## 2015-02-11 DIAGNOSIS — Z955 Presence of coronary angioplasty implant and graft: Secondary | ICD-10-CM | POA: Insufficient documentation

## 2015-02-11 DIAGNOSIS — I739 Peripheral vascular disease, unspecified: Secondary | ICD-10-CM | POA: Insufficient documentation

## 2015-02-11 DIAGNOSIS — I10 Essential (primary) hypertension: Secondary | ICD-10-CM | POA: Diagnosis not present

## 2015-02-11 DIAGNOSIS — J441 Chronic obstructive pulmonary disease with (acute) exacerbation: Secondary | ICD-10-CM

## 2015-02-11 DIAGNOSIS — Z7984 Long term (current) use of oral hypoglycemic drugs: Secondary | ICD-10-CM | POA: Insufficient documentation

## 2015-02-11 DIAGNOSIS — Z87891 Personal history of nicotine dependence: Secondary | ICD-10-CM | POA: Insufficient documentation

## 2015-02-11 DIAGNOSIS — Z7952 Long term (current) use of systemic steroids: Secondary | ICD-10-CM | POA: Diagnosis not present

## 2015-02-11 DIAGNOSIS — E785 Hyperlipidemia, unspecified: Secondary | ICD-10-CM | POA: Diagnosis not present

## 2015-02-11 DIAGNOSIS — E669 Obesity, unspecified: Secondary | ICD-10-CM | POA: Insufficient documentation

## 2015-02-11 DIAGNOSIS — E1140 Type 2 diabetes mellitus with diabetic neuropathy, unspecified: Secondary | ICD-10-CM | POA: Insufficient documentation

## 2015-02-11 DIAGNOSIS — R0602 Shortness of breath: Secondary | ICD-10-CM | POA: Diagnosis present

## 2015-02-11 DIAGNOSIS — I482 Chronic atrial fibrillation: Secondary | ICD-10-CM | POA: Insufficient documentation

## 2015-02-11 DIAGNOSIS — Z7982 Long term (current) use of aspirin: Secondary | ICD-10-CM | POA: Diagnosis not present

## 2015-02-11 DIAGNOSIS — I2510 Atherosclerotic heart disease of native coronary artery without angina pectoris: Secondary | ICD-10-CM | POA: Diagnosis not present

## 2015-02-11 DIAGNOSIS — Z79899 Other long term (current) drug therapy: Secondary | ICD-10-CM | POA: Diagnosis not present

## 2015-02-11 DIAGNOSIS — I5032 Chronic diastolic (congestive) heart failure: Secondary | ICD-10-CM | POA: Insufficient documentation

## 2015-02-11 DIAGNOSIS — Z7901 Long term (current) use of anticoagulants: Secondary | ICD-10-CM | POA: Diagnosis not present

## 2015-02-11 LAB — BASIC METABOLIC PANEL
Anion gap: 11 (ref 5–15)
BUN: 22 mg/dL — AB (ref 6–20)
CALCIUM: 9.2 mg/dL (ref 8.9–10.3)
CHLORIDE: 98 mmol/L — AB (ref 101–111)
CO2: 30 mmol/L (ref 22–32)
CREATININE: 1.68 mg/dL — AB (ref 0.61–1.24)
GFR calc Af Amer: 44 mL/min — ABNORMAL LOW (ref >60)
GFR calc non Af Amer: 38 mL/min — ABNORMAL LOW (ref >60)
Glucose, Bld: 134 mg/dL — ABNORMAL HIGH (ref 65–99)
Potassium: 4.5 mmol/L (ref 3.5–5.1)
SODIUM: 139 mmol/L (ref 135–145)

## 2015-02-11 LAB — TROPONIN I

## 2015-02-11 LAB — CBC WITH DIFFERENTIAL/PLATELET
BASOS PCT: 0 %
Basophils Absolute: 0 10*3/uL (ref 0.0–0.1)
EOS ABS: 0.1 10*3/uL (ref 0.0–0.7)
Eosinophils Relative: 1 %
HCT: 46.6 % (ref 39.0–52.0)
HEMOGLOBIN: 15.3 g/dL (ref 13.0–17.0)
LYMPHS ABS: 0.8 10*3/uL (ref 0.7–4.0)
Lymphocytes Relative: 3 %
MCH: 31.4 pg (ref 26.0–34.0)
MCHC: 32.8 g/dL (ref 30.0–36.0)
MCV: 95.7 fL (ref 78.0–100.0)
MONO ABS: 2 10*3/uL — AB (ref 0.1–1.0)
MONOS PCT: 9 %
NEUTROS PCT: 87 %
Neutro Abs: 19.5 10*3/uL — ABNORMAL HIGH (ref 1.7–7.7)
Platelets: 264 10*3/uL (ref 150–400)
RBC: 4.87 MIL/uL (ref 4.22–5.81)
RDW: 14.3 % (ref 11.5–15.5)
WBC: 22.4 10*3/uL — ABNORMAL HIGH (ref 4.0–10.5)

## 2015-02-11 LAB — BRAIN NATRIURETIC PEPTIDE: B Natriuretic Peptide: 38 pg/mL (ref 0.0–100.0)

## 2015-02-11 LAB — PROTIME-INR
INR: 4.74 — ABNORMAL HIGH (ref 0.00–1.49)
Prothrombin Time: 43.2 seconds — ABNORMAL HIGH (ref 11.6–15.2)

## 2015-02-11 LAB — DIGOXIN LEVEL: DIGOXIN LVL: 0.3 ng/mL — AB (ref 0.8–2.0)

## 2015-02-11 MED ORDER — PREDNISONE 20 MG PO TABS: ORAL_TABLET | ORAL | Status: DC

## 2015-02-11 MED ORDER — ALBUTEROL SULFATE (2.5 MG/3ML) 0.083% IN NEBU
2.5000 mg | INHALATION_SOLUTION | Freq: Once | RESPIRATORY_TRACT | Status: AC
  Administered 2015-02-11: 2.5 mg via RESPIRATORY_TRACT
  Filled 2015-02-11: qty 3

## 2015-02-11 MED ORDER — LEVOFLOXACIN 750 MG PO TABS: 750.0000 mg | ORAL_TABLET | Freq: Every day | ORAL | Status: DC

## 2015-02-11 MED ORDER — IPRATROPIUM-ALBUTEROL 0.5-2.5 (3) MG/3ML IN SOLN
3.0000 mL | Freq: Once | RESPIRATORY_TRACT | Status: AC
  Administered 2015-02-11: 3 mL via RESPIRATORY_TRACT
  Filled 2015-02-11: qty 3

## 2015-02-11 NOTE — ED Notes (Signed)
Pt states breathing better after breathing treatment. Informed waiting on lab work.

## 2015-02-11 NOTE — Discharge Instructions (Signed)
Chronic Obstructive Pulmonary Disease Chronic obstructive pulmonary disease (COPD) is a common lung condition in which airflow from the lungs is limited. COPD is a general term that can be used to describe many different lung problems that limit airflow, including both chronic bronchitis and emphysema. If you have COPD, your lung function will probably never return to normal, but there are measures you can take to improve lung function and make yourself feel better. CAUSES   Smoking (common).  Exposure to secondhand smoke.  Genetic problems.  Chronic inflammatory lung diseases or recurrent infections. SYMPTOMS  Shortness of breath, especially with physical activity.  Deep, persistent (chronic) cough with a large amount of thick mucus.  Wheezing.  Rapid breaths (tachypnea).  Gray or bluish discoloration (cyanosis) of the skin, especially in your fingers, toes, or lips.  Fatigue.  Weight loss.  Frequent infections or episodes when breathing symptoms become much worse (exacerbations).  Chest tightness. DIAGNOSIS Your health care provider will take a medical history and perform a physical examination to diagnose COPD. Additional tests for COPD may include:  Lung (pulmonary) function tests.  Chest X-ray.  CT scan.  Blood tests. TREATMENT  Treatment for COPD may include:  Inhaler and nebulizer medicines. These help manage the symptoms of COPD and make your breathing more comfortable.  Supplemental oxygen. Supplemental oxygen is only helpful if you have a low oxygen level in your blood.  Exercise and physical activity. These are beneficial for nearly all people with COPD.  Lung surgery or transplant.  Nutrition therapy to gain weight, if you are underweight.  Pulmonary rehabilitation. This may involve working with a team of health care providers and specialists, such as respiratory, occupational, and physical therapists. HOME CARE INSTRUCTIONS  Take all medicines  (inhaled or pills) as directed by your health care provider.  Avoid over-the-counter medicines or cough syrups that dry up your airway (such as antihistamines) and slow down the elimination of secretions unless instructed otherwise by your health care provider.  If you are a smoker, the most important thing that you can do is stop smoking. Continuing to smoke will cause further lung damage and breathing trouble. Ask your health care provider for help with quitting smoking. He or she can direct you to community resources or hospitals that provide support.  Avoid exposure to irritants such as smoke, chemicals, and fumes that aggravate your breathing.  Use oxygen therapy and pulmonary rehabilitation if directed by your health care provider. If you require home oxygen therapy, ask your health care provider whether you should purchase a pulse oximeter to measure your oxygen level at home.  Avoid contact with individuals who have a contagious illness.  Avoid extreme temperature and humidity changes.  Eat healthy foods. Eating smaller, more frequent meals and resting before meals may help you maintain your strength.  Stay active, but balance activity with periods of rest. Exercise and physical activity will help you maintain your ability to do things you want to do.  Preventing infection and hospitalization is very important when you have COPD. Make sure to receive all the vaccines your health care provider recommends, especially the pneumococcal and influenza vaccines. Ask your health care provider whether you need a pneumonia vaccine.  Learn and use relaxation techniques to manage stress.  Learn and use controlled breathing techniques as directed by your health care provider. Controlled breathing techniques include:  Pursed lip breathing. Start by breathing in (inhaling) through your nose for 1 second. Then, purse your lips as if you were   going to whistle and breathe out (exhale) through the  pursed lips for 2 seconds.  Diaphragmatic breathing. Start by putting one hand on your abdomen just above your waist. Inhale slowly through your nose. The hand on your abdomen should move out. Then purse your lips and exhale slowly. You should be able to feel the hand on your abdomen moving in as you exhale.  Learn and use controlled coughing to clear mucus from your lungs. Controlled coughing is a series of short, progressive coughs. The steps of controlled coughing are: 1. Lean your head slightly forward. 2. Breathe in deeply using diaphragmatic breathing. 3. Try to hold your breath for 3 seconds. 4. Keep your mouth slightly open while coughing twice. 5. Spit any mucus out into a tissue. 6. Rest and repeat the steps once or twice as needed. SEEK MEDICAL CARE IF:  You are coughing up more mucus than usual.  There is a change in the color or thickness of your mucus.  Your breathing is more labored than usual.  Your breathing is faster than usual. SEEK IMMEDIATE MEDICAL CARE IF:  You have shortness of breath while you are resting.  You have shortness of breath that prevents you from:  Being able to talk.  Performing your usual physical activities.  You have chest pain lasting longer than 5 minutes.  Your skin color is more cyanotic than usual.  You measure low oxygen saturations for longer than 5 minutes with a pulse oximeter. MAKE SURE YOU:  Understand these instructions.  Will watch your condition.  Will get help right away if you are not doing well or get worse.   This information is not intended to replace advice given to you by your health care provider. Make sure you discuss any questions you have with your health care provider.   Document Released: 10/02/2004 Document Revised: 01/13/2014 Document Reviewed: 08/19/2012 Elsevier Interactive Patient Education 2016 [employer]  

## 2015-02-11 NOTE — ED Notes (Signed)
Pt states woke up tonight & states felt SOB. Pt took 2 breathing treatments at home & feels better now.

## 2015-02-11 NOTE — ED Notes (Signed)
Respiratory paged for a breathing treatment.

## 2015-02-11 NOTE — ED Provider Notes (Addendum)
CSN: [id number]     Arrival date & time 02/11/15  0440 History   First MD Initiated Contact with Patient 02/11/15 [phone]     Chief Complaint  Patient presents with  . Shortness of Breath     (Consider location/radiation/quality/duration/timing/severity/associated sxs/prior Treatment) HPI Comments: Patient presents to the ER by and once for evaluation of shortness of breath. Patient reports that he awakened this evening and felt like he could not breathe well. He called EMS. While he was waiting he did give himself 2 breathing treatments. He reports that the second breathing treatment did improve his breathing and he is not having significant shortness of breath at time of arrival. Patient is normally on oxygen at night, was using his oxygen when he developed shortness of breath.  Patient is a 75 y.o. male presenting with shortness of breath.  Shortness of Breath Associated symptoms: no chest pain and no cough     Past Medical History  Diagnosis Date  . COPD with emphysema (HCC)     Followed by Pulmonary  . Chronic atrial fibrillation (HCC)   . Right ventricular dysfunction   . CAD (coronary artery disease)     DES to RCA 2004, moderate residual LAD diisease  . Hyperlipemia   . Obesity   . Hypertension   . Drug allergy     Plavix causes severe rash  . Secondary pulmonary hypertension (HCC)     Moderate in setting of COPD  . Chronic diastolic CHF (congestive heart failure) (HCC)   . Peripheral arterial disease (HCC)   . Type 2 diabetes mellitus with diabetic neuropathy [facility])    Past Surgical History  Procedure Laterality Date  . Rotator cuff repair      x 3  . Knee surgery    . Cataract extraction Bilateral   . Coronary stent placement  2004   Family History  Problem Relation Age of Onset  . Heart disease Mother   . Stroke Mother   . Throat cancer Father   . Lung disease Neg Hx    Social History  Substance Use Topics  . Smoking status: Former Smoker -- 2.00 packs/day  for 48 years    Types: Cigarettes    Start date: 02/14/1956    Quit date: 01/06/1998  . Smokeless tobacco: Never Used  . Alcohol Use: No     Comment: Remote history of use (beer)    Review of Systems  Respiratory: Positive for shortness of breath. Negative for cough.   Cardiovascular: Negative for chest pain.  All other systems reviewed and are negative.     Allergies  Clopidogrel bisulfate  Home Medications   Prior to Admission medications   Medication Sig Start Date End Date Taking? Authorizing Provider  albuterol (PROVENTIL) (2.5 MG/3ML) 0.083% nebulizer solution Take 3 mLs (2.5 mg total) by nebulization every 6 (six) hours as needed for wheezing. 08/15/11  Yes Storm Frisk, MD  albuterol (VENTOLIN HFA) 108 (90 BASE) MCG/ACT inhaler Inhale 2 puffs into the lungs every 6 (six) hours as needed for wheezing. 06/05/10  Yes Storm Frisk, MD  aspirin 81 MG tablet Take 81 mg by mouth daily.     Yes Historical Provider, MD  calcium elemental as carbonate (PX ANTACID MAXIMUM STRENGTH) 400 MG tablet Chew 400 mg by mouth as needed.   Yes Historical Provider, MD  DIGOX 125 MCG tablet TAKE ONE-HALF TABLET BY MOUTH DAILY 02/28/14  Yes [name], MD  docusate sodium (STOOL SOFTENER) 100 MG  capsule Take 100 mg by mouth 2 (two) times daily.   Yes Historical Provider, MD  finasteride (PROSCAR) 5 MG tablet Take 5 mg by mouth daily. 04/12/14  Yes Historical Provider, MD  furosemide (LASIX) 80 MG tablet 1 1/2 in AM 1 PM   Yes Historical Provider, MD  gabapentin (NEURONTIN) 600 MG tablet Take 2 tablets by mouth 3 (three) times daily. 11/01/14  Yes Historical Provider, MD  lovastatin (MEVACOR) 40 MG tablet Take 40 mg by mouth at bedtime.   Yes Historical Provider, MD  metFORMIN (GLUCOPHAGE) 500 MG tablet Take 500 mg by mouth 2 (two) times daily.    Yes Historical Provider, MD  metoprolol (LOPRESSOR) 50 MG tablet Take 25 mg by mouth 2 (two) times daily.   Yes Historical Provider, MD  Multiple  Vitamins-Minerals (CENTRUM PO) Take 1 tablet by mouth daily.     Yes Historical Provider, MD  NITROSTAT 0.4 MG SL tablet Place 0.4 mg under the tongue every 5 (five) minutes as needed.    Yes Historical Provider, MD  potassium chloride SA (K-DUR,KLOR-CON) 20 MEQ tablet TAKE TWO (2) TABLETS BY MOUTH TWICE DAILY need appointment with Dr Diona Browner 11/2014 12/11/14  Yes Jonelle Sidle, MD  predniSONE (DELTASONE) 10 MG tablet Take 1 tablet (10 mg total) by mouth daily with breakfast. 01/23/15  Yes Roslynn Amble, MD  SYMBICORT 160-4.5 MCG/ACT inhaler INHALE TWO PUFFS BY MOUTH TWICE DAILY 08/09/14  Yes Storm Frisk, MD  tamsulosin (FLOMAX) 0.4 MG CAPS capsule Take 0.4 mg by mouth daily. 09/26/13  Yes Historical Provider, MD  tiotropium (SPIRIVA) 18 MCG inhalation capsule Place 18 mcg into inhaler and inhale daily.   Yes Historical Provider, MD  warfarin (COUMADIN) 5 MG tablet Take 1 tablet daily except 1/2 tablet on Mondays and Thursdays 11/07/14  Yes Jonelle Sidle, MD   BP 98/68 mmHg  Pulse 104  Temp(Src) 98.6 F (37 C) (Oral)  Resp 24  Ht 5\' 9"  (1.753 m)  Wt 198 lb (89.812 kg)  BMI 29.23 kg/m2  SpO2 94% Physical Exam  Constitutional: He is oriented to person, place, and time. He appears well-developed and well-nourished. No distress.  HENT:  Head: Normocephalic and atraumatic.  Right Ear: Hearing normal.  Left Ear: Hearing normal.  Nose: Nose normal.  Mouth/Throat: Oropharynx is clear and moist and mucous membranes are normal.  Eyes: Conjunctivae and EOM are normal. Pupils are equal, round, and reactive to light.  Neck: Normal range of motion. Neck supple.  Cardiovascular: Regular rhythm, S1 normal and S2 normal.  Exam reveals no gallop and no friction rub.   No murmur heard. Pulmonary/Chest: Effort normal. No respiratory distress. He has decreased breath sounds. He exhibits no tenderness.  Abdominal: Soft. Normal appearance and bowel sounds are normal. There is no  hepatosplenomegaly. There is no tenderness. There is no rebound, no guarding, no tenderness at McBurney's point and negative Murphy's sign. No hernia.  Musculoskeletal: Normal range of motion.  Neurological: He is alert and oriented to person, place, and time. He has normal strength. No cranial nerve deficit or sensory deficit. Coordination normal. GCS eye subscore is 4. GCS verbal subscore is 5. GCS motor subscore is 6.  Skin: Skin is warm, dry and intact. No rash noted. No cyanosis.  Psychiatric: He has a normal mood and affect. His speech is normal and behavior is normal. Thought content normal.  Nursing note and vitals reviewed.   ED Course  Procedures (including critical care time) Labs Review Labs  Reviewed  CBC WITH DIFFERENTIAL/PLATELET - Abnormal; Notable for the following:    WBC 22.4 (*)    Neutro Abs 19.5 (*)    Monocytes Absolute 2.0 (*)    All other components within normal limits  BASIC METABOLIC PANEL - Abnormal; Notable for the following:    Chloride 98 (*)    Glucose, Bld 134 (*)    BUN 22 (*)    Creatinine, Ser 1.68 (*)    GFR calc non Af Amer 38 (*)    GFR calc Af Amer 44 (*)    All other components within normal limits  PROTIME-INR - Abnormal; Notable for the following:    Prothrombin Time 43.2 (*)    INR 4.74 (*)    All other components within normal limits  TROPONIN I  BRAIN NATRIURETIC PEPTIDE  DIGOXIN LEVEL    Imaging Review Dg Chest 2 View  02/11/2015  CLINICAL DATA:  Shortness of breath EXAM: CHEST  2 VIEW COMPARISON:  11/09/2014 FINDINGS: Chronic hyperinflation with emphysematous change. No cardiomegaly when accounting for apical fat pad. Negative aortic and hilar contours. There is no edema, consolidation, effusion, or pneumothorax. No acute osseous finding. IMPRESSION: COPD without acute superimposed finding. Electronically Signed   By: Marnee Spring M.D.   On: 02/11/2015 05:13   I have personally reviewed and evaluated these images and lab results  as part of my medical decision-making.   EKG Interpretation   Date/Time:  Sunday February 11 2015 04:50:16 EST Ventricular Rate:  100 PR Interval:    QRS Duration: 92 QT Interval:  310 QTC Calculation: 400 R Axis:   82 Text Interpretation:  Atrial fibrillation Borderline right axis deviation  Borderline repolarization abnormality Artifact in lead(s) I II aVR aVL  Confirmed by Ednah Hammock  MD, Angellina Ferdinand ([id number]) on 02/11/2015 5:05:49 AM      MDM   Final diagnoses:  None   COPD exacerbation  Patient awakened with difficulty breathing. Patient does have history of COPD. He did utilizes nebulizer treatment before EMS arrived at his home and he was feeling improved at arrival. He still had diminished breath sounds with slight wheezing. This improved further with additional breathing treatments. Patient is feeling much better at this time. Workup unremarkable. No evidence of pneumonia or congestive heart fire. He has not experiencing chest pain, cardiac evaluation unremarkable. He is in atrial fibrillation which is chronic. He is anticoagulated. Patient was mildly tachycardic at times secondary to albuterol, but did not require treatment for rate control. He is appropriate for continued outpatient management of COPD exacerbation.    Gilda Crease, MD 02/11/15 1324  Gilda Crease, MD 02/11/15 [phone]

## 2015-02-12 ENCOUNTER — Telehealth: Payer: Self-pay | Admitting: Pulmonary Disease

## 2015-02-12 DIAGNOSIS — J449 Chronic obstructive pulmonary disease, unspecified: Secondary | ICD-10-CM

## 2015-02-12 MED ORDER — ALBUTEROL SULFATE HFA 108 (90 BASE) MCG/ACT IN AERS: 2.0000 | INHALATION_SPRAY | Freq: Four times a day (QID) | RESPIRATORY_TRACT | Status: AC | PRN

## 2015-02-12 MED ORDER — ALBUTEROL SULFATE (2.5 MG/3ML) 0.083% IN NEBU: 2.5000 mg | INHALATION_SOLUTION | Freq: Four times a day (QID) | RESPIRATORY_TRACT | Status: AC | PRN

## 2015-02-12 NOTE — Telephone Encounter (Signed)
[phone] calling back

## 2015-02-12 NOTE — Telephone Encounter (Signed)
lmtcb x1 for pt's wife. 

## 2015-02-12 NOTE — Telephone Encounter (Signed)
Spoke with pt's wife. States that the pt needs refills on Albuterol nebs and Albuterol HFA. These have been sent in. Nothing further was needed.

## 2015-02-27 ENCOUNTER — Ambulatory Visit (INDEPENDENT_AMBULATORY_CARE_PROVIDER_SITE_OTHER): Payer: Medicare Other | Admitting: *Deleted

## 2015-02-27 DIAGNOSIS — Z7901 Long term (current) use of anticoagulants: Secondary | ICD-10-CM

## 2015-02-27 DIAGNOSIS — Z5181 Encounter for therapeutic drug level monitoring: Secondary | ICD-10-CM

## 2015-02-27 DIAGNOSIS — I482 Chronic atrial fibrillation, unspecified: Secondary | ICD-10-CM

## 2015-02-27 DIAGNOSIS — I4891 Unspecified atrial fibrillation: Secondary | ICD-10-CM

## 2015-02-27 LAB — POCT INR: INR: 3.3

## 2015-03-19 ENCOUNTER — Ambulatory Visit (INDEPENDENT_AMBULATORY_CARE_PROVIDER_SITE_OTHER): Payer: Medicare Other | Admitting: Internal Medicine

## 2015-03-19 DIAGNOSIS — Z5181 Encounter for therapeutic drug level monitoring: Secondary | ICD-10-CM

## 2015-03-19 DIAGNOSIS — I482 Chronic atrial fibrillation, unspecified: Secondary | ICD-10-CM

## 2015-03-19 LAB — POCT INR: INR: 3.5

## 2015-03-20 ENCOUNTER — Other Ambulatory Visit: Payer: Self-pay | Admitting: Cardiology

## 2015-04-03 ENCOUNTER — Other Ambulatory Visit: Payer: Self-pay | Admitting: Cardiology

## 2015-04-04 ENCOUNTER — Ambulatory Visit (INDEPENDENT_AMBULATORY_CARE_PROVIDER_SITE_OTHER): Payer: Medicare Other | Admitting: *Deleted

## 2015-04-04 DIAGNOSIS — I482 Chronic atrial fibrillation, unspecified: Secondary | ICD-10-CM

## 2015-04-04 DIAGNOSIS — Z5181 Encounter for therapeutic drug level monitoring: Secondary | ICD-10-CM

## 2015-04-04 LAB — POCT INR: INR: 2.9

## 2015-04-17 ENCOUNTER — Ambulatory Visit (INDEPENDENT_AMBULATORY_CARE_PROVIDER_SITE_OTHER): Payer: Medicare Other | Admitting: *Deleted

## 2015-04-17 DIAGNOSIS — I482 Chronic atrial fibrillation, unspecified: Secondary | ICD-10-CM

## 2015-04-17 DIAGNOSIS — Z5181 Encounter for therapeutic drug level monitoring: Secondary | ICD-10-CM

## 2015-04-17 LAB — POCT INR: INR: 2.2

## 2015-04-26 ENCOUNTER — Ambulatory Visit (INDEPENDENT_AMBULATORY_CARE_PROVIDER_SITE_OTHER): Payer: Medicare Other | Admitting: *Deleted

## 2015-04-26 DIAGNOSIS — I482 Chronic atrial fibrillation, unspecified: Secondary | ICD-10-CM

## 2015-04-26 DIAGNOSIS — Z5181 Encounter for therapeutic drug level monitoring: Secondary | ICD-10-CM

## 2015-04-26 LAB — POCT INR: INR: 2

## 2015-05-07 ENCOUNTER — Ambulatory Visit (INDEPENDENT_AMBULATORY_CARE_PROVIDER_SITE_OTHER): Payer: Medicare Other | Admitting: *Deleted

## 2015-05-07 DIAGNOSIS — I482 Chronic atrial fibrillation, unspecified: Secondary | ICD-10-CM

## 2015-05-07 DIAGNOSIS — Z5181 Encounter for therapeutic drug level monitoring: Secondary | ICD-10-CM

## 2015-05-07 LAB — POCT INR: INR: 1.8

## 2015-05-10 ENCOUNTER — Encounter: Payer: Self-pay | Admitting: Pulmonary Disease

## 2015-05-10 ENCOUNTER — Ambulatory Visit (INDEPENDENT_AMBULATORY_CARE_PROVIDER_SITE_OTHER): Payer: Medicare Other | Admitting: Pulmonary Disease

## 2015-05-10 VITALS — BP 116/84 | HR 62 | Ht 68.0 in | Wt 196.0 lb

## 2015-05-10 DIAGNOSIS — Z92241 Personal history of systemic steroid therapy: Secondary | ICD-10-CM | POA: Diagnosis not present

## 2015-05-10 DIAGNOSIS — I272 Other secondary pulmonary hypertension: Secondary | ICD-10-CM | POA: Diagnosis not present

## 2015-05-10 DIAGNOSIS — J9621 Acute and chronic respiratory failure with hypoxia: Secondary | ICD-10-CM | POA: Diagnosis not present

## 2015-05-10 DIAGNOSIS — J449 Chronic obstructive pulmonary disease, unspecified: Secondary | ICD-10-CM

## 2015-05-10 LAB — PULMONARY FUNCTION TEST
DL/VA % PRED: 61 %
DL/VA: 2.74 ml/min/mmHg/L
DLCO UNC % PRED: 45 %
DLCO UNC: 13.48 ml/min/mmHg
FEF 25-75 PRE: 0.52 L/s
FEF 25-75 Post: 0.63 L/sec
FEF2575-%Change-Post: 21 %
FEF2575-%PRED-POST: 32 %
FEF2575-%Pred-Pre: 26 %
FEV1-%CHANGE-POST: 6 %
FEV1-%Pred-Post: 50 %
FEV1-%Pred-Pre: 47 %
FEV1-POST: 1.38 L
FEV1-Pre: 1.29 L
FEV1FVC-%Change-Post: 0 %
FEV1FVC-%Pred-Pre: 63 %
FEV6-%CHANGE-POST: 7 %
FEV6-%PRED-POST: 81 %
FEV6-%PRED-PRE: 75 %
FEV6-POST: 2.9 L
FEV6-PRE: 2.69 L
FEV6FVC-%CHANGE-POST: 0 %
FEV6FVC-%PRED-POST: 102 %
FEV6FVC-%PRED-PRE: 101 %
FVC-%CHANGE-POST: 6 %
FVC-%PRED-POST: 79 %
FVC-%Pred-Pre: 74 %
FVC-Post: 3.04 L
FVC-Pre: 2.84 L
POST FEV6/FVC RATIO: 95 %
PRE FEV6/FVC RATIO: 95 %
Post FEV1/FVC ratio: 45 %
Pre FEV1/FVC ratio: 45 %

## 2015-05-10 MED ORDER — PREDNISONE 2.5 MG PO TABS: 7.5000 mg | ORAL_TABLET | Freq: Every day | ORAL | Status: DC

## 2015-05-10 NOTE — Patient Instructions (Signed)
   Continue taking your inhalers as prescribed.  We are decreasing your Prednisone to 7.5mg  daily. If you have any breathing problems or feel poorly go back to taking 10mg  daily and let me know.  Remember to weigh yourself daily and notify your heart doctor if your weight changes by more than 3 pounds.  I will see you back in 3 months but please call me if you have any problems before then.

## 2015-05-10 NOTE — Progress Notes (Signed)
Subjective:    Patient ID: [name] C Attia, male    DOB: 01/09/1940, 75 y.o.   MRN: [id number]  C.C.:  Follow-up for Severe COPD, Pulmonary Hypertension, Chronic Hypoxic Respiratory Failure, & Chronic Steroid Therapy.  HPI Severe COPD: Prescribed Spiriva & Symbicort. He reports his dyspnea is baseline. He was [facility] in February for an exacerbation & was treated with a nebulizer treatment, Levaquin, & Prednisone. Mild intermittent cough & wheezing at his baseline. Hasn't needed his rescue medications in some time.   Pulmonary Hypertension: Multifactorial from known diastolic congestive heart failure as well as COPD. On chronic anticoagulation with Coumadin for atrial fibrillation. Continuing on Lasix. He hasn't been weighing himself consistently.   Chronic Hypoxic Respiratory Failure: Currently prescribed oxygen at 3 L/m during the day along with 2 L/m at night while sleeping. He is continuing to use his oxygen at night but hasn't been using it consistently with exertion.  Chronic Steroid Therapy:  Patient reports he has been on Prednisone 10mg  daily. He has been on Prednisone since around 2011. Currently on baseline 10mg  daily.   Review of Systems  No chest pain or pressure. No fever chills or sweats. No nausea, emesis or diarrhea.  Allergies  Allergen Reactions  . Clopidogrel Bisulfate     REACTION: rash    Current Outpatient Prescriptions on File Prior to Visit  Medication Sig Dispense Refill  . albuterol (PROVENTIL) (2.5 MG/3ML) 0.083% nebulizer solution Take 3 mLs (2.5 mg total) by nebulization every 6 (six) hours as needed for wheezing. 75 mL 11  . albuterol (VENTOLIN HFA) 108 (90 Base) MCG/ACT inhaler Inhale 2 puffs into the lungs every 6 (six) hours as needed for wheezing. 1 Inhaler 6  . aspirin 81 MG tablet Take 81 mg by mouth daily.      . calcium elemental as carbonate (PX ANTACID MAXIMUM STRENGTH) 400 MG tablet Chew 400 mg by mouth as needed.    Marland Kitchen. DIGOX  125 MCG tablet TAKE ONE-HALF TABLET BY MOUTH DAILY 45 tablet 3  . docusate sodium (STOOL SOFTENER) 100 MG capsule Take 100 mg by mouth 2 (two) times daily.    . finasteride (PROSCAR) 5 MG tablet Take 5 mg by mouth daily.  3  . furosemide (LASIX) 80 MG tablet 1 1/2 in AM 1 PM    . gabapentin (NEURONTIN) 600 MG tablet Take 2 tablets by mouth 3 (three) times daily.  6  . lovastatin (MEVACOR) 40 MG tablet Take 40 mg by mouth at bedtime.    . metFORMIN (GLUCOPHAGE) 500 MG tablet Take 500 mg by mouth 2 (two) times daily.     . metoprolol (LOPRESSOR) 50 MG tablet Take 25 mg by mouth 2 (two) times daily.    . Multiple Vitamins-Minerals (CENTRUM PO) Take 1 tablet by mouth daily.      Marland Kitchen. NITROSTAT 0.4 MG SL tablet Place 0.4 mg under the tongue every 5 (five) minutes as needed.     . potassium chloride SA (K-DUR,KLOR-CON) 20 MEQ tablet TAKE TWO (2) TABLETS BY MOUTH TWICE DAILY. 120 tablet 6  . predniSONE (DELTASONE) 10 MG tablet Take 1 tablet (10 mg total) by mouth daily with breakfast. 30 tablet 11  . SYMBICORT 160-4.5 MCG/ACT inhaler INHALE TWO PUFFS BY MOUTH TWICE DAILY 10.2 g 2  . tamsulosin (FLOMAX) 0.4 MG CAPS capsule Take 0.4 mg by mouth daily.    Marland Kitchen. tiotropium (SPIRIVA) 18 MCG inhalation capsule Place 18 mcg into inhaler and inhale  daily.    . warfarin (COUMADIN) 5 MG tablet Take 1 tablet daily except 1/2 tablet on Mondays and Thursdays 45 tablet 4   No current facility-administered medications on file prior to visit.    Past Medical History  Diagnosis Date  . COPD with emphysema (HCC)     Followed by Pulmonary  . Chronic atrial fibrillation (HCC)   . Right ventricular dysfunction   . CAD (coronary artery disease)     DES to RCA 2004, moderate residual LAD diisease  . Hyperlipemia   . Obesity   . Hypertension   . Drug allergy     Plavix causes severe rash  . Secondary pulmonary hypertension (HCC)     Moderate in setting of COPD  . Chronic diastolic CHF (congestive heart failure) (HCC)     . Peripheral arterial disease (HCC)   . Type 2 diabetes mellitus with diabetic neuropathy [facility])     Past Surgical History  Procedure Laterality Date  . Rotator cuff repair      x 3  . Knee surgery    . Cataract extraction Bilateral   . Coronary stent placement  2004    Family History  Problem Relation Age of Onset  . Heart disease Mother   . Stroke Mother   . Throat cancer Father   . Lung disease Neg Hx     Social History   Social History  . Marital Status: Married    Spouse Name: N/A  . Number of Children: 3  . Years of Education: hs   Occupational History  . Dance movement psychotherapist    Social History Main Topics  . Smoking status: Former Smoker -- 2.00 packs/day for 48 years    Types: Cigarettes    Start date: 02/14/1956    Quit date: 01/06/1998  . Smokeless tobacco: Never Used  . Alcohol Use: No     Comment: Remote history of use (beer)  . Drug Use: No  . Sexual Activity: Not Asked   Other Topics Concern  . None   Social History Narrative   Originally from [place]. 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 No international travel. Previously worked for [employer] in [place]. Exposed to hot asphalt. Has a dog & cat. No bird, hot tub or mold exposure. Previously was playing golf before his joint pain became significant.       Objective:   Physical Exam BP 116/84 mmHg  Pulse 62  Ht  (1.727 m)  Wt 196 lb (88.905 kg)  BMI 29.81 kg/m2  SpO2 99% General:  Awake. Alert. Wife accompanying the patient today.  Integument:  Warm & dry. No rash on exposed skin.  HEENT:  Dry. mucus membranes. No oral ulcers. No scleral injection.  Cardiovascular:  Regular rate. Bilateral Pitting edema. Normal S1 & S2. Pulmonary:  Slightly decreased breath sounds in lung bases. Otherwise clear to auscultation. Normal work of breathing on room air. Abdomen: Soft. Normal bowel sounds. Nondistended.  Musculoskeletal:  Normal bulk and tone. 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  PFT 05/10/15: FVC 2.84 L (74%) FEV1 1.29 L (47%) FEV1/FVC 0.45 FEF 25-75 0.52 (26%) negative bronchodilator response  DLCO uncorrected 45% (unable to perform lung volumes) 08/24/06:FVC 3.15 L (71%) FEV1 1.29 L (43%) FEV1/FVC 0.41 FEF 25-75 0.34 L (12%) positive bronchodilator response TLC 6.79 L (104%) RV 145% ERV 35% DLCO uncorrected 56%  IMAGING CXR PA/LAT 11/09/14 (previously reviewed by me): No focal opacity or effusion appreciated. Mild hyperinflation with flattening of the diaphragms. Heart normal in size & mediastinum normal in contour.   CARDIAC TTE (06/19/11): LV normal in size with mild concentric LVH. EF 55-60%. LA & RA mildly dilated. RV not well visualized. Mild RV dilation with moderately reduced systolic function. RVSP 49 mmHg. No aortic stenosis or regurgitation. No mitral stenosis or regurgitation. No tricuspid regurgitation. No pulmonic regurgitation. No pericardial effusion. Main pulmonary artery not well visualized.  LABS 01/23/15 Alpha-1 antitrypsin: MM (154)  10/25/13 ANA: Negative RF: 10.9    Assessment & Plan:  44 -year-old Caucasian male with long-standing prior history of tobacco use. Spirometry again shows severe COPD but remains relatively stable compared with previous testing. He has no significant bronchodilator response indicating good drug delivery of his current inhaler regimen. Patient does have edema on physical exam today but reports good compliance with his home diuretic therapy. Additionally, he is not weighing himself routinely with his underlying congestive heart failure and pulmonary hypertension. I instructed the patient to contact me if he had any new breathing problems before his next appointment. In an effort to improve his overall health I am attempting to gradually wean his steroid therapy. I remain skeptical that he will be able to completely stop  steroid therapy given its chronicity of use but may tolerate a lower dose.  1. Severe COPD: Continuing prednisone therapy, Symbicort, & Spiriva. 2. Pulmonary hypertension: Multifactorial. Patient currently on anticoagulation with Coumadin as well as diuretic therapy. Counseled the patient on the need for daily weight monitoring and to notify his cardiologist for fluctuations of more than 3 pounds. 3. Chronic hypoxic respiratory failure: Counseled the patient again on the need for oxygen therapy with ambulation with his underlying cardiac dysfunction. Continuing on oxygen as previously prescribed. 4. Chronic Prednisone Therapy: Decreasing the patient's prednisone to 7.5 mg daily. Plan to taper further at next appointment depending upon symptoms. 5. Health maintenance:  Influenza October 2016, Prevnar 11/11/12, & Pneumovax 10/28/07. 6. Follow-up: Patient to return to clinic in 3 months or sooner if needed.  Donna Christen Jamison Neighbor, M.D. [facility] Pulmonary & Critical Care Pager:  [phone] After 3pm or if no response, call [phone] 5:14 PM 05/10/2015

## 2015-05-10 NOTE — Progress Notes (Signed)
PFT done today. 

## 2015-05-11 ENCOUNTER — Telehealth: Payer: Self-pay | Admitting: Pulmonary Disease

## 2015-05-11 NOTE — Telephone Encounter (Signed)
Pt was seen by JN yesterday with the following instructions:  Patient Instructions      Continue taking your inhalers as prescribed.  We are decreasing your Prednisone to 7.5mg  daily. If you have any breathing problems or feel poorly go back to taking 10mg  daily and let me know.  Remember to weigh yourself daily and notify your heart doctor if your weight changes by more than 3 pounds.  I will see you back in 3 months but please call me if you have any problems before then.   -------  Spoke with Amy with [facility].  They currently have pt taking prednisone 10 mg qd.  Amy verifying new prednisone rx sent yesterday.  Advised of above prednisone instructions per JN.  Amy verbalized understanding and voiced no further questions or concerns at this time.

## 2015-05-15 ENCOUNTER — Other Ambulatory Visit: Payer: Self-pay | Admitting: Cardiology

## 2015-05-17 ENCOUNTER — Ambulatory Visit (INDEPENDENT_AMBULATORY_CARE_PROVIDER_SITE_OTHER): Payer: Medicare Other | Admitting: *Deleted

## 2015-05-17 DIAGNOSIS — I482 Chronic atrial fibrillation, unspecified: Secondary | ICD-10-CM

## 2015-05-17 DIAGNOSIS — Z5181 Encounter for therapeutic drug level monitoring: Secondary | ICD-10-CM

## 2015-05-17 LAB — POCT INR: INR: 3.2

## 2015-05-23 ENCOUNTER — Ambulatory Visit (INDEPENDENT_AMBULATORY_CARE_PROVIDER_SITE_OTHER): Payer: Medicare Other | Admitting: *Deleted

## 2015-05-23 ENCOUNTER — Telehealth: Payer: Self-pay | Admitting: *Deleted

## 2015-05-23 DIAGNOSIS — I482 Chronic atrial fibrillation, unspecified: Secondary | ICD-10-CM

## 2015-05-23 DIAGNOSIS — Z5181 Encounter for therapeutic drug level monitoring: Secondary | ICD-10-CM

## 2015-05-23 LAB — POCT INR: INR: 3.2

## 2015-05-23 NOTE — Telephone Encounter (Signed)
PT 38 INR 3.2 --- [phone] --she stated she's out w/ the pt right now

## 2015-05-23 NOTE — Telephone Encounter (Signed)
See coumadin note. 

## 2015-06-06 ENCOUNTER — Ambulatory Visit (INDEPENDENT_AMBULATORY_CARE_PROVIDER_SITE_OTHER): Payer: Medicare Other | Admitting: *Deleted

## 2015-06-06 DIAGNOSIS — I482 Chronic atrial fibrillation, unspecified: Secondary | ICD-10-CM

## 2015-06-06 DIAGNOSIS — Z5181 Encounter for therapeutic drug level monitoring: Secondary | ICD-10-CM

## 2015-06-06 LAB — POCT INR: INR: 2.8

## 2015-06-21 ENCOUNTER — Ambulatory Visit (INDEPENDENT_AMBULATORY_CARE_PROVIDER_SITE_OTHER): Payer: Medicare Other | Admitting: *Deleted

## 2015-06-21 DIAGNOSIS — Z5181 Encounter for therapeutic drug level monitoring: Secondary | ICD-10-CM

## 2015-06-21 DIAGNOSIS — I482 Chronic atrial fibrillation, unspecified: Secondary | ICD-10-CM

## 2015-06-21 LAB — POCT INR: INR: 2.3

## 2015-07-01 ENCOUNTER — Inpatient Hospital Stay (HOSPITAL_COMMUNITY)
Admission: EM | Admit: 2015-07-01 | Discharge: 2015-07-04 | DRG: 871 | Disposition: A | Discharge status: Home Health | Payer: Medicare Other | Attending: Internal Medicine | Admitting: Internal Medicine

## 2015-07-01 ENCOUNTER — Encounter (HOSPITAL_COMMUNITY): Payer: Self-pay | Admitting: *Deleted

## 2015-07-01 ENCOUNTER — Other Ambulatory Visit: Payer: Self-pay

## 2015-07-01 DIAGNOSIS — J449 Chronic obstructive pulmonary disease, unspecified: Secondary | ICD-10-CM

## 2015-07-01 DIAGNOSIS — Z9841 Cataract extraction status, right eye: Secondary | ICD-10-CM

## 2015-07-01 DIAGNOSIS — J189 Pneumonia, unspecified organism: Secondary | ICD-10-CM

## 2015-07-01 DIAGNOSIS — L03116 Cellulitis of left lower limb: Secondary | ICD-10-CM

## 2015-07-01 DIAGNOSIS — I482 Chronic atrial fibrillation, unspecified: Secondary | ICD-10-CM | POA: Diagnosis present

## 2015-07-01 DIAGNOSIS — L03115 Cellulitis of right lower limb: Secondary | ICD-10-CM | POA: Diagnosis present

## 2015-07-01 DIAGNOSIS — I5032 Chronic diastolic (congestive) heart failure: Secondary | ICD-10-CM | POA: Diagnosis present

## 2015-07-01 DIAGNOSIS — Z9981 Dependence on supplemental oxygen: Secondary | ICD-10-CM

## 2015-07-01 DIAGNOSIS — Z7901 Long term (current) use of anticoagulants: Secondary | ICD-10-CM

## 2015-07-01 DIAGNOSIS — Z9842 Cataract extraction status, left eye: Secondary | ICD-10-CM

## 2015-07-01 DIAGNOSIS — J9621 Acute and chronic respiratory failure with hypoxia: Secondary | ICD-10-CM | POA: Diagnosis present

## 2015-07-01 DIAGNOSIS — E1140 Type 2 diabetes mellitus with diabetic neuropathy, unspecified: Secondary | ICD-10-CM | POA: Diagnosis present

## 2015-07-01 DIAGNOSIS — Z79899 Other long term (current) drug therapy: Secondary | ICD-10-CM

## 2015-07-01 DIAGNOSIS — N183 Chronic kidney disease, stage 3 unspecified: Secondary | ICD-10-CM | POA: Diagnosis present

## 2015-07-01 DIAGNOSIS — D72829 Elevated white blood cell count, unspecified: Secondary | ICD-10-CM

## 2015-07-01 DIAGNOSIS — Z7982 Long term (current) use of aspirin: Secondary | ICD-10-CM

## 2015-07-01 DIAGNOSIS — L97519 Non-pressure chronic ulcer of other part of right foot with unspecified severity: Secondary | ICD-10-CM | POA: Diagnosis present

## 2015-07-01 DIAGNOSIS — N289 Disorder of kidney and ureter, unspecified: Secondary | ICD-10-CM

## 2015-07-01 DIAGNOSIS — J441 Chronic obstructive pulmonary disease with (acute) exacerbation: Secondary | ICD-10-CM

## 2015-07-01 DIAGNOSIS — Z87891 Personal history of nicotine dependence: Secondary | ICD-10-CM

## 2015-07-01 DIAGNOSIS — Z7952 Long term (current) use of systemic steroids: Secondary | ICD-10-CM

## 2015-07-01 DIAGNOSIS — E11621 Type 2 diabetes mellitus with foot ulcer: Secondary | ICD-10-CM | POA: Diagnosis present

## 2015-07-01 DIAGNOSIS — E872 Acidosis: Secondary | ICD-10-CM | POA: Diagnosis present

## 2015-07-01 DIAGNOSIS — E118 Type 2 diabetes mellitus with unspecified complications: Secondary | ICD-10-CM | POA: Diagnosis present

## 2015-07-01 DIAGNOSIS — N400 Enlarged prostate without lower urinary tract symptoms: Secondary | ICD-10-CM | POA: Diagnosis present

## 2015-07-01 DIAGNOSIS — I130 Hypertensive heart and chronic kidney disease with heart failure and stage 1 through stage 4 chronic kidney disease, or unspecified chronic kidney disease: Secondary | ICD-10-CM | POA: Diagnosis present

## 2015-07-01 DIAGNOSIS — R0602 Shortness of breath: Secondary | ICD-10-CM | POA: Diagnosis not present

## 2015-07-01 DIAGNOSIS — E1122 Type 2 diabetes mellitus with diabetic chronic kidney disease: Secondary | ICD-10-CM | POA: Diagnosis present

## 2015-07-01 DIAGNOSIS — R7989 Other specified abnormal findings of blood chemistry: Secondary | ICD-10-CM

## 2015-07-01 DIAGNOSIS — E1151 Type 2 diabetes mellitus with diabetic peripheral angiopathy without gangrene: Secondary | ICD-10-CM | POA: Diagnosis present

## 2015-07-01 DIAGNOSIS — Z955 Presence of coronary angioplasty implant and graft: Secondary | ICD-10-CM

## 2015-07-01 DIAGNOSIS — E785 Hyperlipidemia, unspecified: Secondary | ICD-10-CM | POA: Diagnosis present

## 2015-07-01 DIAGNOSIS — J9610 Chronic respiratory failure, unspecified whether with hypoxia or hypercapnia: Secondary | ICD-10-CM | POA: Diagnosis present

## 2015-07-01 DIAGNOSIS — A419 Sepsis, unspecified organism: Secondary | ICD-10-CM | POA: Diagnosis not present

## 2015-07-01 DIAGNOSIS — Z7984 Long term (current) use of oral hypoglycemic drugs: Secondary | ICD-10-CM

## 2015-07-01 DIAGNOSIS — R0989 Other specified symptoms and signs involving the circulatory and respiratory systems: Secondary | ICD-10-CM

## 2015-07-01 DIAGNOSIS — I2510 Atherosclerotic heart disease of native coronary artery without angina pectoris: Secondary | ICD-10-CM | POA: Diagnosis present

## 2015-07-01 LAB — CBC WITH DIFFERENTIAL/PLATELET
Basophils Absolute: 0 10*3/uL (ref 0.0–0.1)
Basophils Relative: 0 %
Eosinophils Absolute: 0 10*3/uL (ref 0.0–0.7)
Eosinophils Relative: 0 %
HEMATOCRIT: 45.8 % (ref 39.0–52.0)
HEMOGLOBIN: 15.2 g/dL (ref 13.0–17.0)
LYMPHS ABS: 0.7 10*3/uL (ref 0.7–4.0)
LYMPHS PCT: 3 %
MCH: 31.5 pg (ref 26.0–34.0)
MCHC: 33.2 g/dL (ref 30.0–36.0)
MCV: 95 fL (ref 78.0–100.0)
MONO ABS: 1.5 10*3/uL — AB (ref 0.1–1.0)
MONOS PCT: 6 %
NEUTROS ABS: 21.8 10*3/uL — AB (ref 1.7–7.7)
NEUTROS PCT: 91 %
Platelets: 221 10*3/uL (ref 150–400)
RBC: 4.82 MIL/uL (ref 4.22–5.81)
RDW: 14.3 % (ref 11.5–15.5)
WBC: 24.1 10*3/uL — ABNORMAL HIGH (ref 4.0–10.5)

## 2015-07-01 MED ORDER — ACETAMINOPHEN 650 MG RE SUPP
RECTAL | Status: AC
  Administered 2015-07-01: 650 mg
  Filled 2015-07-01: qty 1

## 2015-07-01 NOTE — ED Notes (Addendum)
Pt brought in by [facility] for c/o sob; when ems arrived pt was confused and rales present; pt told ems he felt sob all day; pt arrived on bipap machine; pt cbg 110

## 2015-07-01 NOTE — ED Provider Notes (Signed)
CSN: [id number]     Arrival date & time 07/01/15  2320 History  By signing my name below, I, Jasmyn B. Alexander, attest that this documentation has been prepared under the direction and in the presence of Dione Boozeavid Anapaula Severt, MD.  Electronically Signed: Gillis EndsJasmyn B. Lyn HollingsheadAlexander, ED Scribe. 07/01/2015. 11:50 PM.    Chief Complaint  Patient presents with  . Shortness of Breath   The history is provided by the patient and the EMS personnel. No language interpreter was used.   LEVEL 5 CAVEAT - SEVERITY OF ILLNESS HPI Comments: Meredeth IdeJames C Mell is a 75 y.o. male brought in by ambulance, with PMHx of COPD, CAD, HTN, DM, and CHF who presents to the Emergency Department complaining of gradual onset, constant, worsening shortness of breath x 1 day. Per EMS, they note that pt had rales present and he was also confused at the scene. He arrived at [facility] on bipap machine. His CBG was 110 per EMS. Pt has associated productive cough with white phlegm.   Past Medical History  Diagnosis Date  . COPD with emphysema (HCC)     Followed by Pulmonary  . Chronic atrial fibrillation (HCC)   . Right ventricular dysfunction   . CAD (coronary artery disease)     DES to RCA 2004, moderate residual LAD diisease  . Hyperlipemia   . Obesity   . Hypertension   . Drug allergy     Plavix causes severe rash  . Secondary pulmonary hypertension (HCC)     Moderate in setting of COPD  . Chronic diastolic CHF (congestive heart failure) (HCC)   . Peripheral arterial disease (HCC)   . Type 2 diabetes mellitus with diabetic neuropathy [facility](HCC)    Past Surgical History  Procedure Laterality Date  . Rotator cuff repair      x 3  . Knee surgery    . Cataract extraction Bilateral   . Coronary stent placement  2004   Family History  Problem Relation Age of Onset  . Heart disease Mother   . Stroke Mother   . Throat cancer Father   . Lung disease Neg Hx    Social History  Substance Use Topics  . Smoking status: Former Smoker -- 2.00  packs/day for 48 years    Types: Cigarettes    Start date: 02/14/1956    Quit date: 01/06/1998  . Smokeless tobacco: Never Used  . Alcohol Use: No     Comment: Remote history of use (beer)    Review of Systems  Unable to perform ROS: Severe respiratory distress   Allergies  Clopidogrel bisulfate  Home Medications   Prior to Admission medications   Medication Sig Start Date End Date Taking? Authorizing Provider  albuterol (PROVENTIL) (2.5 MG/3ML) 0.083% nebulizer solution Take 3 mLs (2.5 mg total) by nebulization every 6 (six) hours as needed for wheezing. 02/12/15   Roslynn AmbleJennings E Nestor, MD  albuterol (VENTOLIN HFA) 108 (90 Base) MCG/ACT inhaler Inhale 2 puffs into the lungs every 6 (six) hours as needed for wheezing. 02/12/15   Roslynn AmbleJennings E Nestor, MD  aspirin 81 MG tablet Take 81 mg by mouth daily.      Historical Provider, MD  calcium elemental as carbonate (PX ANTACID MAXIMUM STRENGTH) 400 MG tablet Chew 400 mg by mouth as needed.    Historical Provider, MD  DIGOX 125 MCG tablet TAKE ONE-HALF TABLET BY MOUTH DAILY 03/20/15   Jonelle SidleSamuel G McDowell, MD  docusate sodium (STOOL SOFTENER) 100 MG capsule Take 100 mg by  mouth 2 (two) times daily.    Historical Provider, MD  finasteride (PROSCAR) 5 MG tablet Take 5 mg by mouth daily. 04/12/14   Historical Provider, MD  furosemide (LASIX) 80 MG tablet 1 1/2 in AM 1 PM    Historical Provider, MD  furosemide (LASIX) 80 MG tablet TAKE 1 AND 1/2 TABLETS BY MOUTH EVERY MORNING AND TAKE ONE TABLET BY MOUTH EVERY EVENING. 05/15/15   Jonelle SidleSamuel G McDowell, MD  gabapentin (NEURONTIN) 600 MG tablet Take 2 tablets by mouth 3 (three) times daily. 11/01/14   Historical Provider, MD  lovastatin (MEVACOR) 40 MG tablet Take 40 mg by mouth at bedtime.    Historical Provider, MD  metFORMIN (GLUCOPHAGE) 500 MG tablet Take 500 mg by mouth 2 (two) times daily.     Historical Provider, MD  metoprolol (LOPRESSOR) 50 MG tablet Take 25 mg by mouth 2 (two) times daily.    Historical  Provider, MD  Multiple Vitamins-Minerals (CENTRUM PO) Take 1 tablet by mouth daily.      Historical Provider, MD  NITROSTAT 0.4 MG SL tablet Place 0.4 mg under the tongue every 5 (five) minutes as needed.     Historical Provider, MD  potassium chloride SA (K-DUR,KLOR-CON) 20 MEQ tablet TAKE TWO (2) TABLETS BY MOUTH TWICE DAILY. 04/03/15   Jonelle SidleSamuel G McDowell, MD  predniSONE (DELTASONE) 10 MG tablet Take 1 tablet (10 mg total) by mouth daily with breakfast. 01/23/15   Roslynn AmbleJennings E Nestor, MD  predniSONE (DELTASONE) 2.5 MG tablet Take 3 tablets (7.5 mg total) by mouth daily with breakfast. 05/10/15   Roslynn AmbleJennings E Nestor, MD  SYMBICORT 160-4.5 MCG/ACT inhaler INHALE TWO PUFFS BY MOUTH TWICE DAILY 08/09/14   Storm FriskPatrick E Wright, MD  tamsulosin (FLOMAX) 0.4 MG CAPS capsule Take 0.4 mg by mouth daily. 09/26/13   Historical Provider, MD  tiotropium (SPIRIVA) 18 MCG inhalation capsule Place 18 mcg into inhaler and inhale daily.    Historical Provider, MD  ULORIC 40 MG tablet Take 1 tablet by mouth daily. 04/30/15   Historical Provider, MD  warfarin (COUMADIN) 5 MG tablet Take 1 tablet daily except 1/2 tablet on Mondays and Thursdays 11/07/14   Jonelle SidleSamuel G McDowell, MD   SpO2 100% Physical Exam  Constitutional: He is oriented to person, place, and time. He appears well-developed and well-nourished.  On bi-pap.  HENT:  Head: Normocephalic and atraumatic.  Eyes: EOM are normal. Pupils are equal, round, and reactive to light.  Neck: Normal range of motion. Neck supple. No JVD present.  Cardiovascular: Normal rate, regular rhythm and normal heart sounds.   No murmur heard. Pulmonary/Chest: He is in respiratory distress. He has no wheezes. He has rales. He exhibits no tenderness.  Lungs have diminished airflow with scattered rhonchi  Abdominal: Soft. Bowel sounds are normal. He exhibits no distension and no mass. There is no tenderness.  Musculoskeletal: Normal range of motion.  1+ pitting edema with moderate venous stasis  changes. Ulcer present on the lateral aspect of the right foot lateral to 5th MTP joint.  Lymphadenopathy:    He has no cervical adenopathy.  Neurological: He is alert and oriented to person, place, and time. No cranial nerve deficit. He exhibits normal muscle tone. Coordination normal.  Skin: Skin is warm and dry. No rash noted.  Moderate discoloration of right foot and right lower leg.  Psychiatric: He has a normal mood and affect. His behavior is normal. Judgment and thought content normal.  Nursing note and vitals reviewed.   ED  Course  Procedures (including critical care time) DIAGNOSTIC STUDIES: Oxygen Saturation is 100% on bipap machine, normal by my interpretation.    COORDINATION OF CARE: 11:38 PM-Discussed treatment plan which includes CBC, CMP, Troponin, UA, Blood gas arterial, and CXR with pt at bedside and pt agreed to plan.   Labs Review Results for orders placed or performed during the hospital encounter of 07/01/15  Culture, blood (routine x 2)  Result Value Ref Range   Specimen Description BLOOD LEFT ARM    Special Requests BOTTLES DRAWN AEROBIC AND ANAEROBIC 4CC EACH    Culture PENDING    Report Status PENDING   Culture, blood (routine x 2)  Result Value Ref Range   Specimen Description BLOOD RIGHT ARM    Special Requests BOTTLES DRAWN AEROBIC AND ANAEROBIC 6CC EACH    Culture PENDING    Report Status PENDING   CBC with Differential  Result Value Ref Range   WBC 24.1 (H) 4.0 - 10.5 K/uL   RBC 4.82 4.22 - 5.81 MIL/uL   Hemoglobin 15.2 13.0 - 17.0 g/dL   HCT 84.1 32.4 - 40.1 %   MCV 95.0 78.0 - 100.0 fL   MCH 31.5 26.0 - 34.0 pg   MCHC 33.2 30.0 - 36.0 g/dL   RDW 02.7 25.3 - 66.4 %   Platelets 221 150 - 400 K/uL   Neutrophils Relative % 91 %   Neutro Abs 21.8 (H) 1.7 - 7.7 K/uL   Lymphocytes Relative 3 %   Lymphs Abs 0.7 0.7 - 4.0 K/uL   Monocytes Relative 6 %   Monocytes Absolute 1.5 (H) 0.1 - 1.0 K/uL   Eosinophils Relative 0 %   Eosinophils  Absolute 0.0 0.0 - 0.7 K/uL   Basophils Relative 0 %   Basophils Absolute 0.0 0.0 - 0.1 K/uL  Comprehensive metabolic panel  Result Value Ref Range   Sodium 138 135 - 145 mmol/L   Potassium 4.1 3.5 - 5.1 mmol/L   Chloride 99 (L) 101 - 111 mmol/L   CO2 28 22 - 32 mmol/L   Glucose, Bld 133 (H) 65 - 99 mg/dL   BUN 19 6 - 20 mg/dL   Creatinine, Ser 4.03 (H) 0.61 - 1.24 mg/dL   Calcium 8.4 (L) 8.9 - 10.3 mg/dL   Total Protein 6.6 6.5 - 8.1 g/dL   Albumin 3.8 3.5 - 5.0 g/dL   AST 24 15 - 41 U/L   ALT 19 17 - 63 U/L   Alkaline Phosphatase 36 (L) 38 - 126 U/L   Total Bilirubin 0.5 0.3 - 1.2 mg/dL   GFR calc non Af Amer 45 (L) >60 mL/min   GFR calc Af Amer 52 (L) >60 mL/min   Anion gap 11 5 - 15  Troponin I  Result Value Ref Range   Troponin I <0.03 <0.031 ng/mL  Brain natriuretic peptide  Result Value Ref Range   B Natriuretic Peptide 54.0 0.0 - 100.0 pg/mL  Protime-INR  Result Value Ref Range   Prothrombin Time 24.7 (H) 11.6 - 15.2 seconds   INR 2.26 (H) 0.00 - 1.49  Urinalysis, Routine w reflex microscopic  Result Value Ref Range   Color, Urine YELLOW YELLOW   APPearance CLEAR CLEAR   Specific Gravity, Urine 1.010 1.005 - 1.030   pH 5.5 5.0 - 8.0   Glucose, UA NEGATIVE NEGATIVE mg/dL   Hgb urine dipstick NEGATIVE NEGATIVE   Bilirubin Urine NEGATIVE NEGATIVE   Ketones, ur NEGATIVE NEGATIVE mg/dL   Protein, ur NEGATIVE  NEGATIVE mg/dL   Nitrite NEGATIVE NEGATIVE   Leukocytes, UA NEGATIVE NEGATIVE  I-Stat CG4 Lactic Acid, ED  Result Value Ref Range   Lactic Acid, Venous 4.76 (HH) 0.5 - 2.0 mmol/L   Imaging Review Dg Chest Port 1 View  07/02/2015  CLINICAL DATA:  Confusion, lightheadedness and dyspnea for 1 day. EXAM: PORTABLE CHEST 1 VIEW COMPARISON:  02/11/2015 FINDINGS: Unchanged mild cardiomegaly. The lungs are clear. The pulmonary vasculature is normal. No large effusions. IMPRESSION: No active disease. Electronically Signed   By: Ellery Plunk M.D.   On: 07/02/2015  00:43   I have personally reviewed and evaluated these images and lab results as part of my medical decision-making.   EKG Interpretation   Date/Time:  Sunday July 01 2015 23:28:25 EDT Ventricular Rate:  100 PR Interval:    QRS Duration: 94 QT Interval:  320 QTC Calculation: 413 R Axis:   80 Text Interpretation:  Atrial fibrillation Borderline repolarization  abnormality When compared with ECG of 02/11/2015, No significant change was  found Confirmed by [facility]  MD, Zi Newbury ([id number]) on 07/01/2015 11:47:04 PM      CRITICAL CARE Performed by: DGLOV,FIEPP Total critical care time: 55 minutes Critical care time was exclusive of separately billable procedures and treating other patients. Critical care was necessary to treat or prevent imminent or life-threatening deterioration. Critical care was time spent personally by me on the following activities: development of treatment plan with patient and/or surrogate as well as nursing, discussions with consultants, evaluation of patient's response to treatment, examination of patient, obtaining history from patient or surrogate, ordering and performing treatments and interventions, ordering and review of laboratory studies, ordering and review of radiographic studies, pulse oximetry and re-evaluation of patient's condition.  MDM   Final diagnoses:  Community acquired pneumonia  COPD exacerbation (HCC)  Elevated lactic acid level  Renal insufficiency  Leukocytosis    Acute respiratory distress with fever. Chest x-ray was obtained which was read by radiologist as no infiltrate but I'm suspicious that there is a right lower lobe infiltrate. Lactic acid level is come back elevated so he started on sepsis protocol and started on antibiotics for community-acquired pneumonia. Foot ulcer is present as well but that this does not appear to be the source of sepsis. Laboratory workup shows mild renal insufficiency which is unchanged from baseline. Marked  leukocytosis is noted but not significantly changed from last value on record. He was maintained on BiPAP and was oxygenating well with this. Case is discussed with Dr. Conley Rolls of [facility] who agrees to admit the patient.  I personally performed the services described in this documentation, which was scribed in my presence. The recorded information has been reviewed and is accurate.      Dione Booze, MD 07/02/15 [phone]

## 2015-07-02 ENCOUNTER — Inpatient Hospital Stay (HOSPITAL_COMMUNITY): Payer: Medicare Other

## 2015-07-02 ENCOUNTER — Encounter (HOSPITAL_COMMUNITY): Payer: Self-pay | Admitting: *Deleted

## 2015-07-02 ENCOUNTER — Emergency Department (HOSPITAL_COMMUNITY): Payer: Medicare Other

## 2015-07-02 DIAGNOSIS — Z79899 Other long term (current) drug therapy: Secondary | ICD-10-CM | POA: Diagnosis not present

## 2015-07-02 DIAGNOSIS — Z9981 Dependence on supplemental oxygen: Secondary | ICD-10-CM | POA: Diagnosis not present

## 2015-07-02 DIAGNOSIS — Z7982 Long term (current) use of aspirin: Secondary | ICD-10-CM | POA: Diagnosis not present

## 2015-07-02 DIAGNOSIS — J9610 Chronic respiratory failure, unspecified whether with hypoxia or hypercapnia: Secondary | ICD-10-CM

## 2015-07-02 DIAGNOSIS — I5032 Chronic diastolic (congestive) heart failure: Secondary | ICD-10-CM | POA: Diagnosis not present

## 2015-07-02 DIAGNOSIS — Z87891 Personal history of nicotine dependence: Secondary | ICD-10-CM | POA: Diagnosis not present

## 2015-07-02 DIAGNOSIS — I2510 Atherosclerotic heart disease of native coronary artery without angina pectoris: Secondary | ICD-10-CM | POA: Diagnosis present

## 2015-07-02 DIAGNOSIS — Z9841 Cataract extraction status, right eye: Secondary | ICD-10-CM | POA: Diagnosis not present

## 2015-07-02 DIAGNOSIS — J441 Chronic obstructive pulmonary disease with (acute) exacerbation: Secondary | ICD-10-CM | POA: Diagnosis present

## 2015-07-02 DIAGNOSIS — Z7984 Long term (current) use of oral hypoglycemic drugs: Secondary | ICD-10-CM | POA: Diagnosis not present

## 2015-07-02 DIAGNOSIS — I130 Hypertensive heart and chronic kidney disease with heart failure and stage 1 through stage 4 chronic kidney disease, or unspecified chronic kidney disease: Secondary | ICD-10-CM | POA: Diagnosis present

## 2015-07-02 DIAGNOSIS — A419 Sepsis, unspecified organism: Secondary | ICD-10-CM | POA: Diagnosis present

## 2015-07-02 DIAGNOSIS — L03115 Cellulitis of right lower limb: Secondary | ICD-10-CM | POA: Diagnosis present

## 2015-07-02 DIAGNOSIS — N183 Chronic kidney disease, stage 3 (moderate): Secondary | ICD-10-CM | POA: Diagnosis present

## 2015-07-02 DIAGNOSIS — N400 Enlarged prostate without lower urinary tract symptoms: Secondary | ICD-10-CM | POA: Diagnosis present

## 2015-07-02 DIAGNOSIS — J9621 Acute and chronic respiratory failure with hypoxia: Secondary | ICD-10-CM

## 2015-07-02 DIAGNOSIS — I482 Chronic atrial fibrillation: Secondary | ICD-10-CM

## 2015-07-02 DIAGNOSIS — R0602 Shortness of breath: Secondary | ICD-10-CM | POA: Diagnosis present

## 2015-07-02 DIAGNOSIS — J449 Chronic obstructive pulmonary disease, unspecified: Secondary | ICD-10-CM

## 2015-07-02 DIAGNOSIS — E1140 Type 2 diabetes mellitus with diabetic neuropathy, unspecified: Secondary | ICD-10-CM | POA: Diagnosis present

## 2015-07-02 DIAGNOSIS — Z7901 Long term (current) use of anticoagulants: Secondary | ICD-10-CM | POA: Diagnosis not present

## 2015-07-02 DIAGNOSIS — E872 Acidosis: Secondary | ICD-10-CM | POA: Diagnosis present

## 2015-07-02 DIAGNOSIS — E785 Hyperlipidemia, unspecified: Secondary | ICD-10-CM | POA: Diagnosis present

## 2015-07-02 DIAGNOSIS — Z7952 Long term (current) use of systemic steroids: Secondary | ICD-10-CM | POA: Diagnosis not present

## 2015-07-02 DIAGNOSIS — E1122 Type 2 diabetes mellitus with diabetic chronic kidney disease: Secondary | ICD-10-CM | POA: Diagnosis present

## 2015-07-02 DIAGNOSIS — E1151 Type 2 diabetes mellitus with diabetic peripheral angiopathy without gangrene: Secondary | ICD-10-CM | POA: Diagnosis present

## 2015-07-02 DIAGNOSIS — L97519 Non-pressure chronic ulcer of other part of right foot with unspecified severity: Secondary | ICD-10-CM | POA: Diagnosis present

## 2015-07-02 DIAGNOSIS — E11621 Type 2 diabetes mellitus with foot ulcer: Secondary | ICD-10-CM | POA: Diagnosis present

## 2015-07-02 DIAGNOSIS — L03116 Cellulitis of left lower limb: Secondary | ICD-10-CM

## 2015-07-02 DIAGNOSIS — Z9842 Cataract extraction status, left eye: Secondary | ICD-10-CM | POA: Diagnosis not present

## 2015-07-02 DIAGNOSIS — Z955 Presence of coronary angioplasty implant and graft: Secondary | ICD-10-CM | POA: Diagnosis not present

## 2015-07-02 LAB — COMPREHENSIVE METABOLIC PANEL
ALT: 13 U/L — ABNORMAL LOW (ref 17–63)
ALT: 19 U/L (ref 17–63)
ANION GAP: 8 (ref 5–15)
AST: 21 U/L (ref 15–41)
AST: 24 U/L (ref 15–41)
Albumin: 3.1 g/dL — ABNORMAL LOW (ref 3.5–5.0)
Albumin: 3.8 g/dL (ref 3.5–5.0)
Alkaline Phosphatase: 28 U/L — ABNORMAL LOW (ref 38–126)
Alkaline Phosphatase: 36 U/L — ABNORMAL LOW (ref 38–126)
Anion gap: 11 (ref 5–15)
BUN: 19 mg/dL (ref 6–20)
BUN: 19 mg/dL (ref 6–20)
CHLORIDE: 104 mmol/L (ref 101–111)
CHLORIDE: 99 mmol/L — AB (ref 101–111)
CO2: 26 mmol/L (ref 22–32)
CO2: 28 mmol/L (ref 22–32)
CREATININE: 1.46 mg/dL — AB (ref 0.61–1.24)
Calcium: 7.7 mg/dL — ABNORMAL LOW (ref 8.9–10.3)
Calcium: 8.4 mg/dL — ABNORMAL LOW (ref 8.9–10.3)
Creatinine, Ser: 1.44 mg/dL — ABNORMAL HIGH (ref 0.61–1.24)
GFR calc Af Amer: 52 mL/min — ABNORMAL LOW (ref >60)
GFR calc Af Amer: 53 mL/min — ABNORMAL LOW (ref >60)
GFR, EST NON AFRICAN AMERICAN: 45 mL/min — AB (ref >60)
GFR, EST NON AFRICAN AMERICAN: 45 mL/min — AB (ref >60)
GLUCOSE: 133 mg/dL — AB (ref 65–99)
Glucose, Bld: 182 mg/dL — ABNORMAL HIGH (ref 65–99)
POTASSIUM: 4.1 mmol/L (ref 3.5–5.1)
Potassium: 4.1 mmol/L (ref 3.5–5.1)
Sodium: 138 mmol/L (ref 135–145)
Sodium: 138 mmol/L (ref 135–145)
Total Bilirubin: 0.5 mg/dL (ref 0.3–1.2)
Total Bilirubin: 0.8 mg/dL (ref 0.3–1.2)
Total Protein: 5.5 g/dL — ABNORMAL LOW (ref 6.5–8.1)
Total Protein: 6.6 g/dL (ref 6.5–8.1)

## 2015-07-02 LAB — URINALYSIS, ROUTINE W REFLEX MICROSCOPIC
BILIRUBIN URINE: NEGATIVE
GLUCOSE, UA: NEGATIVE mg/dL
HGB URINE DIPSTICK: NEGATIVE
KETONES UR: NEGATIVE mg/dL
Leukocytes, UA: NEGATIVE
Nitrite: NEGATIVE
PROTEIN: NEGATIVE mg/dL
Specific Gravity, Urine: 1.01 (ref 1.005–1.030)
pH: 5.5 (ref 5.0–8.0)

## 2015-07-02 LAB — GLUCOSE, CAPILLARY
GLUCOSE-CAPILLARY: 121 mg/dL — AB (ref 65–99)
GLUCOSE-CAPILLARY: 156 mg/dL — AB (ref 65–99)
GLUCOSE-CAPILLARY: 140 mg/dL — AB (ref 65–99)
GLUCOSE-CAPILLARY: 134 mg/dL — AB (ref 65–99)
Glucose-Capillary: 128 mg/dL — ABNORMAL HIGH (ref 65–99)

## 2015-07-02 LAB — LACTIC ACID, PLASMA
LACTIC ACID, VENOUS: 2.7 mmol/L — AB (ref 0.5–2.0)
Lactic Acid, Venous: 3.5 mmol/L (ref 0.5–2.0)

## 2015-07-02 LAB — DIGOXIN LEVEL: Digoxin Level: 0.3 ng/mL — ABNORMAL LOW (ref 0.8–2.0)

## 2015-07-02 LAB — CBC
HEMATOCRIT: 39.8 % (ref 39.0–52.0)
HEMOGLOBIN: 12.8 g/dL — AB (ref 13.0–17.0)
MCH: 30.8 pg (ref 26.0–34.0)
MCHC: 32.2 g/dL (ref 30.0–36.0)
MCV: 95.9 fL (ref 78.0–100.0)
Platelets: 202 10*3/uL (ref 150–400)
RBC: 4.15 MIL/uL — AB (ref 4.22–5.81)
RDW: 14.3 % (ref 11.5–15.5)
WBC: 25.6 10*3/uL — AB (ref 4.0–10.5)

## 2015-07-02 LAB — TROPONIN I

## 2015-07-02 LAB — PROTIME-INR
INR: 2.26 — AB (ref 0.00–1.49)
INR: 2.55 — AB (ref 0.00–1.49)
Prothrombin Time: 24.7 seconds — ABNORMAL HIGH (ref 11.6–15.2)
Prothrombin Time: 27.1 seconds — ABNORMAL HIGH (ref 11.6–15.2)

## 2015-07-02 LAB — TSH: TSH: 1.328 u[IU]/mL (ref 0.350–4.500)

## 2015-07-02 LAB — I-STAT CG4 LACTIC ACID, ED: Lactic Acid, Venous: 4.76 mmol/L (ref 0.5–2.0)

## 2015-07-02 LAB — BRAIN NATRIURETIC PEPTIDE: B Natriuretic Peptide: 54 pg/mL (ref 0.0–100.0)

## 2015-07-02 LAB — MRSA PCR SCREENING: MRSA BY PCR: NEGATIVE

## 2015-07-02 MED ORDER — ALBUTEROL SULFATE (2.5 MG/3ML) 0.083% IN NEBU
2.5000 mg | INHALATION_SOLUTION | Freq: Four times a day (QID) | RESPIRATORY_TRACT | Status: DC | PRN
  Administered 2015-07-04: 2.5 mg via RESPIRATORY_TRACT
  Filled 2015-07-02: qty 3

## 2015-07-02 MED ORDER — DIGOXIN 125 MCG PO TABS
62.5000 ug | ORAL_TABLET | Freq: Every day | ORAL | Status: DC
  Administered 2015-07-02 – 2015-07-04 (×3): 62.5 ug via ORAL
  Filled 2015-07-02 (×3): qty 1

## 2015-07-02 MED ORDER — SODIUM CHLORIDE 0.9% FLUSH
3.0000 mL | Freq: Two times a day (BID) | INTRAVENOUS | Status: DC
  Administered 2015-07-02 – 2015-07-03 (×5): 3 mL via INTRAVENOUS

## 2015-07-02 MED ORDER — PRAVASTATIN SODIUM 40 MG PO TABS
40.0000 mg | ORAL_TABLET | Freq: Every day | ORAL | Status: DC
  Administered 2015-07-02 – 2015-07-03 (×2): 40 mg via ORAL
  Filled 2015-07-02 (×2): qty 1

## 2015-07-02 MED ORDER — ASPIRIN EC 81 MG PO TBEC
81.0000 mg | DELAYED_RELEASE_TABLET | Freq: Every day | ORAL | Status: DC
  Administered 2015-07-02 – 2015-07-04 (×3): 81 mg via ORAL
  Filled 2015-07-02 (×3): qty 1

## 2015-07-02 MED ORDER — POTASSIUM CHLORIDE CRYS ER 10 MEQ PO TBCR
10.0000 meq | EXTENDED_RELEASE_TABLET | Freq: Every day | ORAL | Status: DC
  Administered 2015-07-02 – 2015-07-04 (×3): 10 meq via ORAL
  Filled 2015-07-02 (×3): qty 1

## 2015-07-02 MED ORDER — FINASTERIDE 5 MG PO TABS
5.0000 mg | ORAL_TABLET | Freq: Every day | ORAL | Status: DC
  Administered 2015-07-02 – 2015-07-04 (×3): 5 mg via ORAL
  Filled 2015-07-02 (×5): qty 1

## 2015-07-02 MED ORDER — SODIUM CHLORIDE 0.9 % IV BOLUS (SEPSIS)
1000.0000 mL | Freq: Once | INTRAVENOUS | Status: AC
  Administered 2015-07-02: 1000 mL via INTRAVENOUS

## 2015-07-02 MED ORDER — HYDROCORTISONE NA SUCCINATE PF 100 MG IJ SOLR
50.0000 mg | Freq: Three times a day (TID) | INTRAMUSCULAR | Status: AC
  Administered 2015-07-02 (×3): 50 mg via INTRAVENOUS
  Filled 2015-07-02 (×3): qty 2

## 2015-07-02 MED ORDER — GABAPENTIN 300 MG PO CAPS
600.0000 mg | ORAL_CAPSULE | Freq: Three times a day (TID) | ORAL | Status: DC
  Administered 2015-07-02 – 2015-07-04 (×7): 600 mg via ORAL
  Filled 2015-07-02 (×7): qty 2

## 2015-07-02 MED ORDER — GABAPENTIN 600 MG PO TABS
1200.0000 mg | ORAL_TABLET | Freq: Three times a day (TID) | ORAL | Status: DC
  Filled 2015-07-02: qty 2

## 2015-07-02 MED ORDER — INSULIN ASPART 100 UNIT/ML ~~LOC~~ SOLN
0.0000 [IU] | Freq: Three times a day (TID) | SUBCUTANEOUS | Status: DC
  Administered 2015-07-02 (×2): 2 [IU] via SUBCUTANEOUS

## 2015-07-02 MED ORDER — FEBUXOSTAT 40 MG PO TABS
40.0000 mg | ORAL_TABLET | Freq: Every day | ORAL | Status: DC
  Administered 2015-07-02 – 2015-07-04 (×3): 40 mg via ORAL
  Filled 2015-07-02 (×5): qty 1

## 2015-07-02 MED ORDER — INSULIN ASPART 100 UNIT/ML ~~LOC~~ SOLN
0.0000 [IU] | SUBCUTANEOUS | Status: DC
  Administered 2015-07-02: 3 [IU] via SUBCUTANEOUS

## 2015-07-02 MED ORDER — POTASSIUM CHLORIDE IN NACL 20-0.9 MEQ/L-% IV SOLN
INTRAVENOUS | Status: DC
  Administered 2015-07-02 – 2015-07-03 (×2): via INTRAVENOUS

## 2015-07-02 MED ORDER — DOCUSATE SODIUM 100 MG PO CAPS
100.0000 mg | ORAL_CAPSULE | Freq: Two times a day (BID) | ORAL | Status: DC
Start: 2015-07-02 — End: 2015-07-04
  Administered 2015-07-02 – 2015-07-04 (×5): 100 mg via ORAL
  Filled 2015-07-02 (×5): qty 1

## 2015-07-02 MED ORDER — PREDNISONE 5 MG PO TABS
7.5000 mg | ORAL_TABLET | Freq: Every day | ORAL | Status: DC
  Filled 2015-07-02: qty 1

## 2015-07-02 MED ORDER — DEXTROSE 5 % IV SOLN
500.0000 mg | Freq: Once | INTRAVENOUS | Status: AC
  Administered 2015-07-02: 500 mg via INTRAVENOUS
  Filled 2015-07-02: qty 500

## 2015-07-02 MED ORDER — TIOTROPIUM BROMIDE MONOHYDRATE 18 MCG IN CAPS
18.0000 ug | ORAL_CAPSULE | Freq: Every day | RESPIRATORY_TRACT | Status: DC
  Administered 2015-07-02 – 2015-07-04 (×3): 18 ug via RESPIRATORY_TRACT
  Filled 2015-07-02: qty 5

## 2015-07-02 MED ORDER — INSULIN ASPART 100 UNIT/ML ~~LOC~~ SOLN
0.0000 [IU] | Freq: Every day | SUBCUTANEOUS | Status: DC
Start: 2015-07-02 — End: 2015-07-04

## 2015-07-02 MED ORDER — SODIUM CHLORIDE 0.9 % IV BOLUS (SEPSIS): 1000.0000 mL | Freq: Once | INTRAVENOUS | Status: DC

## 2015-07-02 MED ORDER — TAMSULOSIN HCL 0.4 MG PO CAPS
0.4000 mg | ORAL_CAPSULE | Freq: Every day | ORAL | Status: DC
  Administered 2015-07-02 – 2015-07-04 (×3): 0.4 mg via ORAL
  Filled 2015-07-02 (×3): qty 1

## 2015-07-02 MED ORDER — DEXTROSE 5 % IV SOLN
1.0000 g | Freq: Once | INTRAVENOUS | Status: AC
  Administered 2015-07-02: 1 g via INTRAVENOUS
  Filled 2015-07-02: qty 10

## 2015-07-02 MED ORDER — PREDNISONE 5 MG PO TABS
7.5000 mg | ORAL_TABLET | Freq: Every day | ORAL | Status: DC
  Administered 2015-07-02 – 2015-07-04 (×3): 7.5 mg via ORAL
  Filled 2015-07-02 (×3): qty 1.5
  Filled 2015-07-02: qty 2

## 2015-07-02 MED ORDER — MOMETASONE FURO-FORMOTEROL FUM 200-5 MCG/ACT IN AERO
2.0000 | INHALATION_SPRAY | Freq: Two times a day (BID) | RESPIRATORY_TRACT | Status: DC
  Administered 2015-07-02 – 2015-07-04 (×5): 2 via RESPIRATORY_TRACT
  Filled 2015-07-02: qty 8.8

## 2015-07-02 MED ORDER — DEXTROSE 5 % IV SOLN
2.0000 g | INTRAVENOUS | Status: DC
  Administered 2015-07-03 – 2015-07-04 (×2): 2 g via INTRAVENOUS
  Filled 2015-07-02 (×3): qty 2

## 2015-07-02 MED ORDER — ALBUTEROL SULFATE HFA 108 (90 BASE) MCG/ACT IN AERS: 2.0000 | INHALATION_SPRAY | Freq: Four times a day (QID) | RESPIRATORY_TRACT | Status: DC | PRN

## 2015-07-02 MED ORDER — DEXTROSE 5 % IV SOLN
500.0000 mg | INTRAVENOUS | Status: DC
  Administered 2015-07-03 – 2015-07-04 (×2): 500 mg via INTRAVENOUS
  Filled 2015-07-02 (×2): qty 500

## 2015-07-02 MED ORDER — DEXTROSE-NACL 5-0.9 % IV SOLN
INTRAVENOUS | Status: DC
  Administered 2015-07-02: 04:00:00 via INTRAVENOUS

## 2015-07-02 NOTE — H&P (Signed)
History and Physical    DOIS [name]:[id number] DOB: 1940-07-16 DOA: 07/01/2015  Referring MD/NP/PA: Dione Booze, MD PCP: Juliette Alcide, MD Outpatient Specialists:   Cardiology; Jonelle Sidle, MD  Pulmonology Disease; Storm Frisk, MD  Dermatology; Marcelino Duster, MD  Patient coming from: home Chief Complaint: Shortness of breath  HPI: [name] is a 75 y.o. male with medical history significant of COPD, chronic a fib on Coumadin, CAD, HLD, HTN, chronic diastolic CHF, BPH, and DM type 2, presents via ambulance with complaints of rapid onset, worsening SOB onset this night after dinner. He reports having been in his usual state of health until this night around dinner time when he developed a productive cough. He reports that he normally ambulates well with walker, but needed assistance tonight after his SOB quickly worsened.  He has had a fever, and elevated WBC, though he takes prednisone daily. When EMS arrived, he was found to be confused and was placed on BiPAP. He denies CP.   ED Course: Since arriving in the ED, his SOB has improved with BiPAP. Bloodwork showed an elevated creatinine. BNP and troponins were negative. Lactic acid returned at 4.76 ad WBC was elevated 24.1. He was started on IV Rocephin and Zithromax, and blood cultures were collected and sent. CXR showed no active diseases. Hospitalist was asked to admit for further management of sepsis, and acute on chronic respiratory failure.   Review of Systems: As per HPI otherwise 10 point review of systems negative.   Past Medical History  Diagnosis Date  . COPD with emphysema (HCC)     Followed by Pulmonary  . Chronic atrial fibrillation (HCC)   . Right ventricular dysfunction   . CAD (coronary artery disease)     DES to RCA 2004, moderate residual LAD diisease  . Hyperlipemia   . Obesity   . Hypertension   . Drug allergy     Plavix causes severe rash  . Secondary pulmonary hypertension (HCC)       Moderate in setting of COPD  . Chronic diastolic CHF (congestive heart failure) (HCC)   . Peripheral arterial disease (HCC)   . Type 2 diabetes mellitus with diabetic neuropathy [facility])     Past Surgical History  Procedure Laterality Date  . Rotator cuff repair      x 3  . Knee surgery    . Cataract extraction Bilateral   . Coronary stent placement  2004     reports that he quit smoking about 17 years ago. His smoking use included Cigarettes. He started smoking about 59 years ago. He has a 96 pack-year smoking history. He has never used smokeless tobacco. He reports that he does not drink alcohol or use illicit drugs.  Allergies  Allergen Reactions  . Clopidogrel Bisulfate     REACTION: rash    Family History  Problem Relation Age of Onset  . Heart disease Mother   . Stroke Mother   . Throat cancer Father   . Lung disease Neg Hx    Prior to Admission medications   Medication Sig Start Date End Date Taking? Authorizing Provider  albuterol (PROVENTIL) (2.5 MG/3ML) 0.083% nebulizer solution Take 3 mLs (2.5 mg total) by nebulization every 6 (six) hours as needed for wheezing. 02/12/15   Roslynn Amble, MD  albuterol (VENTOLIN HFA) 108 (90 Base) MCG/ACT inhaler Inhale 2 puffs into the lungs every 6 (six) hours as needed for wheezing. 02/12/15   Roslynn Amble, MD  aspirin 81 MG tablet Take 81 mg by mouth daily.      Historical Provider, MD  calcium elemental as carbonate (PX ANTACID MAXIMUM STRENGTH) 400 MG tablet Chew 400 mg by mouth as needed.    Historical Provider, MD  DIGOX 125 MCG tablet TAKE ONE-HALF TABLET BY MOUTH DAILY 03/20/15   Jonelle SidleSamuel G McDowell, MD  docusate sodium (STOOL SOFTENER) 100 MG capsule Take 100 mg by mouth 2 (two) times daily.    Historical Provider, MD  finasteride (PROSCAR) 5 MG tablet Take 5 mg by mouth daily. 04/12/14   Historical Provider, MD  furosemide (LASIX) 80 MG tablet 1 1/2 in AM 1 PM    Historical Provider, MD  furosemide (LASIX) 80 MG tablet  TAKE 1 AND 1/2 TABLETS BY MOUTH EVERY MORNING AND TAKE ONE TABLET BY MOUTH EVERY EVENING. 05/15/15   Jonelle SidleSamuel G McDowell, MD  gabapentin (NEURONTIN) 600 MG tablet Take 2 tablets by mouth 3 (three) times daily. 11/01/14   Historical Provider, MD  lovastatin (MEVACOR) 40 MG tablet Take 40 mg by mouth at bedtime.    Historical Provider, MD  metFORMIN (GLUCOPHAGE) 500 MG tablet Take 500 mg by mouth 2 (two) times daily.     Historical Provider, MD  metoprolol (LOPRESSOR) 50 MG tablet Take 25 mg by mouth 2 (two) times daily.    Historical Provider, MD  Multiple Vitamins-Minerals (CENTRUM PO) Take 1 tablet by mouth daily.      Historical Provider, MD  NITROSTAT 0.4 MG SL tablet Place 0.4 mg under the tongue every 5 (five) minutes as needed.     Historical Provider, MD  potassium chloride SA (K-DUR,KLOR-CON) 20 MEQ tablet TAKE TWO (2) TABLETS BY MOUTH TWICE DAILY. 04/03/15   Jonelle SidleSamuel G McDowell, MD  predniSONE (DELTASONE) 10 MG tablet Take 1 tablet (10 mg total) by mouth daily with breakfast. 01/23/15   Roslynn AmbleJennings E Nestor, MD  predniSONE (DELTASONE) 2.5 MG tablet Take 3 tablets (7.5 mg total) by mouth daily with breakfast. 05/10/15   Roslynn AmbleJennings E Nestor, MD  SYMBICORT 160-4.5 MCG/ACT inhaler INHALE TWO PUFFS BY MOUTH TWICE DAILY 08/09/14   Storm FriskPatrick E Wright, MD  tamsulosin (FLOMAX) 0.4 MG CAPS capsule Take 0.4 mg by mouth daily. 09/26/13   Historical Provider, MD  tiotropium (SPIRIVA) 18 MCG inhalation capsule Place 18 mcg into inhaler and inhale daily.    Historical Provider, MD  ULORIC 40 MG tablet Take 1 tablet by mouth daily. 04/30/15   Historical Provider, MD  warfarin (COUMADIN) 5 MG tablet Take 1 tablet daily except 1/2 tablet on Mondays and Thursdays 11/07/14   Jonelle SidleSamuel G McDowell, MD   Physical Exam: Filed Vitals:   07/02/15 0000 07/02/15 0015 07/02/15 0030 07/02/15 0048  BP: 133/90  95/62   Pulse:  102 84   Temp:      TempSrc:      Resp: 28 28 23    Weight:    87.091 kg (192 lb)  SpO2:  100% 98%     Constitutional: NAD, calm, comfortable Filed Vitals:   07/02/15 0000 07/02/15 0015 07/02/15 0030 07/02/15 0048  BP: 133/90  95/62   Pulse:  102 84   Temp:      TempSrc:      Resp: 28 28 23    Weight:    87.091 kg (192 lb)  SpO2:  100% 98%    Eyes: PERRL, lids and conjunctivae normal ENMT: Mucous membranes are moist. Posterior pharynx clear of any exudate or lesions.Normal dentition.  Neck: normal, supple,  no masses, no thyromegaly Respiratory: clear to auscultation bilaterally, no wheezing, no crackles. Normal respiratory effort. No accessory muscle use.  Cardiovascular: Regular rate and rhythm, no murmurs / rubs / gallops. No extremity edema. 2+ pedal pulses. No carotid bruits.  Abdomen: no tenderness, no masses palpated. No hepatosplenomegaly. Bowel sounds positive.  Musculoskeletal: no clubbing / cyanosis. No joint deformity upper and lower extremities. Good ROM, no contractures. Normal muscle tone.  Skin: no rashes, lesions, ulcers. No induration  Neurologic: CN 2-12 grossly intact. Sensation intact, DTR normal. Strength 5/5 in all 4.  Psychiatric: Normal judgment and insight. Alert and oriented x 3. Normal mood.   Labs on Admission: I have personally reviewed following labs and imaging studies  CBC:  Recent Labs Lab 07/01/15 2338  WBC 24.1*  NEUTROABS 21.8*  HGB 15.2  HCT 45.8  MCV 95.0  PLT 221   Basic Metabolic Panel:  Recent Labs Lab 07/01/15 2338  NA 138  K 4.1  CL 99*  CO2 28  GLUCOSE 133*  BUN 19  CREATININE 1.46*  CALCIUM 8.4*   GFR: Estimated Creatinine Clearance: 45.5 mL/min (by C-G formula based on Cr of 1.46). Liver Function Tests:  Recent Labs Lab 07/01/15 2338  AST 24  ALT 19  ALKPHOS 36*  BILITOT 0.5  PROT 6.6  ALBUMIN 3.8   Cardiac Enzymes:  Recent Labs Lab 07/01/15 2338  TROPONINI <0.03   Radiological Exams on Admission: Dg Chest Port 1 View  07/02/2015  CLINICAL DATA:  Confusion, lightheadedness and dyspnea for 1 day.  EXAM: PORTABLE CHEST 1 VIEW COMPARISON:  02/11/2015 FINDINGS: Unchanged mild cardiomegaly. The lungs are clear. The pulmonary vasculature is normal. No large effusions. IMPRESSION: No active disease. Electronically Signed   By: Ellery Plunkaniel R Mitchell M.D.   On: 07/02/2015 00:43    EKG: Independently reviewed.   Assessment/Plan Principal Problem:   Sepsis (HCC) Active Problems:   Chronic atrial fibrillation (HCC)   Warfarin anticoagulation   Hyperlipemia   Hypertension   Chronic respiratory failure (HCC)   Blisters of multiple sites   Chronic diastolic CHF (congestive heart failure) (HCC)   Diabetes (HCC)  1. Sepsis. Etiology possibly pulmonary, though he also has a diabetic wound on his left foot which could be the source.  His CXR was negative.   Lactic acid 4.76. WBC 24, though he takes prednisone every day. Will continue with IV Rocephin and Zithromax, stress dose steroids, gentle fluids. Follow up blood cultures.  2. Acute on chronic respiratory failure. He is steroid dependent. Sats improved with BiPAP. Will wean supplemental O2 back to baseline. Unable to obtain ABG.   3. ? PNA. CXR shows no acute abnormalities. Patient is febrile and short of breath, though he reports that his symptoms onset rapidly. Will continue antibiotics. 4. LLE cellulitis. Patient has a wound on his left lower extremity. Will cover with Rocephin at 2 grams per day.  5. Adrenal insufficieny. IV Solucortef. Continue prednisone.   6. COPD.  Continue steroids, antibiotics, and supplemental O2. 7. Chronic diastolic CHF. Appears compensated at this point. BNP and troponin negative. Will be conservative with IV fluids. 8. Chronic Afib, on Coumadin. Will continue anticoagulation and Digoxin. Will check Digoxin level.  9. DM type 2. Will hold metformin. Start on SSI. 10. HLD. Continue statins.  11. HTN. Will hold BB due to hypotension. Anticipate improvement with solucortef.  12. BPH. Stable 13. CAD. Stable.  DVT  prophylaxis: Coumadin Code Status: Full Family Communication: Discussed with patient, wife, and step daughter  at bedside.  Disposition Plan: Admit to ICU, discharge home once improved Consults called: none Admission status: Admit as inpatient  Houston Siren, MD FACP [facility]  If 7PM-7AM, please contact night-coverage [email or web address] Password [email or web address] 07/02/2015, 1:09 AM   By signing my name below, I, Adron Bene, attest that this documentation has been prepared under the direction and in the presence of Houston Siren, MD. Electronically Signed: Adron Bene, Scribe 07/02/2015 1:10am

## 2015-07-02 NOTE — ED Notes (Cosign Needed)
Dr. Conley RollsLe in to room with pt and advised this RN to stop the fluid boluses on this pt. Verbal orders given to given NS at 100/ml/hr.

## 2015-07-02 NOTE — Progress Notes (Signed)
Pharmacy Antibiotic Note  [name] is a 75 y.o. male admitted on 07/01/2015 with pneumonia.  Pharmacy has been consulted for azithromycin dosing. He is also receiving rocephin 2gm IV q24 hours to cover cellulitis as well  Plan: Cont azithromycin 500 mg IV q24 hours F/u cultures and clinical course  Height: 5\' 9"  (175.3 cm) Weight: 198 lb 6.6 oz (90 kg) IBW/kg (Calculated) : 70.7  Temp (24hrs), Avg:99.7 F (37.6 C), Min:97 F (36.1 C), Max:102.3 F (39.1 C)   Recent Labs Lab 07/01/15 2338 07/01/15 2345 07/02/15 0325 07/02/15 0617  WBC 24.1*  --   --  25.6*  CREATININE 1.46*  --   --  1.44*  LATICACIDVEN  --  4.76* 3.5* 2.7*    Estimated Creatinine Clearance: 47.6 mL/min (by C-G formula based on Cr of 1.44).    Allergies  Allergen Reactions  . Clopidogrel Bisulfate     REACTION: rash    Antimicrobials this admission: rocephin 6/26 >>  azithromycin 6/26 >>    Thank you for allowing pharmacy to be a part of this patient's care.  Talbert CageSeay, Shalon Councilman Poteet 07/02/2015 8:00 AM

## 2015-07-02 NOTE — Progress Notes (Addendum)
Patient is a 75 year old man with a history of steroid and oxygen dependent COPD, chronic atrial fibrillation on Coumadin, chronic diastolic heart failure, CAD, and DM-2, who presented to the ED with confusion and shortness of breath. He was placed on BiPAP by the EMS. In the ED, he was febrile with a temperature 102.3. Oxygenating well on BiPAP. Blood pressure was low normal. Lactic acid was elevated at 4.76. White blood cell count was 24.1. Creatinine was 1.46. INR was 2.26. Chest x-ray revealed no acute disease. Urinalysis was negative. Patient was admitted for further sepsis, possibly secondary to pneumonia that had not fluffed out yet and/or cellulitis of the right foot.  Patient was seen and examined. His chart, vital signs, laboratory studies were reviewed. Agree with current management, with additions below.  -We'll discontinue BiPAP as patient appears improved clinically. ABG was attempted, but was not successful. -Start carbohydrate modified/heart healthy diet. Discontinue dextrose and IV fluids. -We'll order a chest x-ray for follow-up and an x-ray of the right foot to assess for a deeper infection. Patient has cellulitis of the right lateral foot around the fifth metatarsal, but no open drainage of the ulcer.

## 2015-07-02 NOTE — Progress Notes (Signed)
Fio2 decreased to 50%

## 2015-07-02 NOTE — Progress Notes (Signed)
MD requested pt to be off BIPAP. Pt tolerating well on 2L [place]. No distress noted. VS stable at this time.

## 2015-07-03 DIAGNOSIS — Z7901 Long term (current) use of anticoagulants: Secondary | ICD-10-CM

## 2015-07-03 DIAGNOSIS — N183 Chronic kidney disease, stage 3 unspecified: Secondary | ICD-10-CM | POA: Diagnosis present

## 2015-07-03 DIAGNOSIS — E118 Type 2 diabetes mellitus with unspecified complications: Secondary | ICD-10-CM

## 2015-07-03 DIAGNOSIS — J449 Chronic obstructive pulmonary disease, unspecified: Secondary | ICD-10-CM

## 2015-07-03 LAB — GLUCOSE, CAPILLARY
GLUCOSE-CAPILLARY: 97 mg/dL (ref 65–99)
GLUCOSE-CAPILLARY: 95 mg/dL (ref 65–99)
GLUCOSE-CAPILLARY: 117 mg/dL — AB (ref 65–99)
Glucose-Capillary: 99 mg/dL (ref 65–99)

## 2015-07-03 LAB — BASIC METABOLIC PANEL
ANION GAP: 7 (ref 5–15)
BUN: 29 mg/dL — AB (ref 6–20)
CHLORIDE: 106 mmol/L (ref 101–111)
CO2: 26 mmol/L (ref 22–32)
Calcium: 7.6 mg/dL — ABNORMAL LOW (ref 8.9–10.3)
Creatinine, Ser: 1.52 mg/dL — ABNORMAL HIGH (ref 0.61–1.24)
GFR calc Af Amer: 49 mL/min — ABNORMAL LOW (ref >60)
GFR, EST NON AFRICAN AMERICAN: 42 mL/min — AB (ref >60)
GLUCOSE: 143 mg/dL — AB (ref 65–99)
POTASSIUM: 3.5 mmol/L (ref 3.5–5.1)
Sodium: 139 mmol/L (ref 135–145)

## 2015-07-03 LAB — CBC
HCT: 36.5 % — ABNORMAL LOW (ref 39.0–52.0)
HEMOGLOBIN: 11.8 g/dL — AB (ref 13.0–17.0)
MCH: 30.5 pg (ref 26.0–34.0)
MCHC: 32.3 g/dL (ref 30.0–36.0)
MCV: 94.3 fL (ref 78.0–100.0)
PLATELETS: 194 10*3/uL (ref 150–400)
RBC: 3.87 MIL/uL — AB (ref 4.22–5.81)
RDW: 14.3 % (ref 11.5–15.5)
WBC: 16.2 10*3/uL — AB (ref 4.0–10.5)

## 2015-07-03 LAB — URINE CULTURE: Culture: 2000 — AB

## 2015-07-03 LAB — PROTIME-INR
INR: 2.56 — ABNORMAL HIGH (ref 0.00–1.49)
Prothrombin Time: 27.1 seconds — ABNORMAL HIGH (ref 11.6–15.2)

## 2015-07-03 MED ORDER — FUROSEMIDE 20 MG PO TABS
10.0000 mg | ORAL_TABLET | Freq: Two times a day (BID) | ORAL | Status: DC
  Administered 2015-07-03 – 2015-07-04 (×3): 10 mg via ORAL
  Filled 2015-07-03 (×3): qty 1

## 2015-07-03 MED ORDER — METOPROLOL TARTRATE 12.5 MG HALF TABLET: 6.2500 mg | ORAL_TABLET | Freq: Two times a day (BID) | ORAL | Status: DC

## 2015-07-03 MED ORDER — WARFARIN SODIUM 2.5 MG PO TABS
2.5000 mg | ORAL_TABLET | Freq: Once | ORAL | Status: AC
  Administered 2015-07-03: 2.5 mg via ORAL
  Filled 2015-07-03: qty 1

## 2015-07-03 MED ORDER — WARFARIN - PHARMACIST DOSING INPATIENT
Status: DC
  Administered 2015-07-03: 16:00:00

## 2015-07-03 MED ORDER — METOPROLOL SUCCINATE ER 25 MG PO TB24
12.5000 mg | ORAL_TABLET | Freq: Every day | ORAL | Status: DC
  Administered 2015-07-03 – 2015-07-04 (×2): 12.5 mg via ORAL
  Filled 2015-07-03 (×2): qty 1

## 2015-07-03 NOTE — Progress Notes (Signed)
ANTICOAGULATION CONSULT NOTE - Initial Consult  Pharmacy Consult for Coumadin (chronic Rx PTA) Indication: atrial fibrillation  Allergies  Allergen Reactions  . Clopidogrel Bisulfate     REACTION: rash   Patient Measurements: Height: 5\' 9"  (175.3 cm) Weight: 202 lb 9.6 oz (91.9 kg) IBW/kg (Calculated) : 70.7  Vital Signs: Temp: 96.9 F (36.1 C) (06/27 0720) Temp Source: Oral (06/27 0720) BP: 106/62 mmHg (06/27 0900) Pulse Rate: 76 (06/27 1000)  Labs:  Recent Labs  07/01/15 2338 07/01/15 2354 07/02/15 0617 07/03/15 0402  HGB 15.2  --  12.8* 11.8*  HCT 45.8  --  39.8 36.5*  PLT 221  --  202 194  LABPROT  --  24.7* 27.1* 27.1*  INR  --  2.26* 2.55* 2.56*  CREATININE 1.46*  --  1.44* 1.52*  TROPONINI <0.03  --   --   --    Estimated Creatinine Clearance: 45.6 mL/min (by C-G formula based on Cr of 1.52).  Medical History: Past Medical History  Diagnosis Date  . COPD with emphysema (HCC)     Followed by Pulmonary  . Chronic atrial fibrillation (HCC)   . Right ventricular dysfunction   . CAD (coronary artery disease)     DES to RCA 2004, moderate residual LAD diisease  . Hyperlipemia   . Obesity   . Hypertension   . Drug allergy     Plavix causes severe rash  . Secondary pulmonary hypertension (HCC)     Moderate in setting of COPD  . Chronic diastolic CHF (congestive heart failure) (HCC)   . Peripheral arterial disease (HCC)   . Type 2 diabetes mellitus with diabetic neuropathy (HCC)    Medications:  Prescriptions prior to admission  Medication Sig Dispense Refill Last Dose  . albuterol (PROVENTIL) (2.5 MG/3ML) 0.083% nebulizer solution Take 3 mLs (2.5 mg total) by nebulization every 6 (six) hours as needed for wheezing. 75 mL 11 Past Month at Unknown time  . albuterol (VENTOLIN HFA) 108 (90 Base) MCG/ACT inhaler Inhale 2 puffs into the lungs every 6 (six) hours as needed for wheezing. 1 Inhaler 6 unknown  . aspirin 81 MG tablet Take 81 mg by mouth daily.      07/01/2015 at Unknown time  . calcium elemental as carbonate (PX ANTACID MAXIMUM STRENGTH) 400 MG tablet Chew 400 mg by mouth as needed.   unknown  . DIGOX 125 MCG tablet TAKE ONE-HALF TABLET BY MOUTH DAILY 45 tablet 3 07/02/2015 at Unknown time  . docusate sodium (STOOL SOFTENER) 100 MG capsule Take 100 mg by mouth 2 (two) times daily.   07/01/2015 at Unknown time  . finasteride (PROSCAR) 5 MG tablet Take 5 mg by mouth daily.  3 07/01/2015 at Unknown time  . furosemide (LASIX) 80 MG tablet TAKE 1 AND 1/2 TABLETS BY MOUTH EVERY MORNING AND TAKE ONE TABLET BY MOUTH EVERY EVENING. 75 tablet 3 07/01/2015 at Unknown time  . gabapentin (NEURONTIN) 600 MG tablet Take 2 tablets by mouth 3 (three) times daily.  6 07/01/2015 at Unknown time  . lovastatin (MEVACOR) 40 MG tablet Take 40 mg by mouth at bedtime.   07/01/2015 at Unknown time  . metFORMIN (GLUCOPHAGE) 500 MG tablet Take 500 mg by mouth 2 (two) times daily.    07/01/2015 at Unknown time  . metoprolol (LOPRESSOR) 50 MG tablet Take 25 mg by mouth 2 (two) times daily.   07/01/2015 at 1800  . Multiple Vitamins-Minerals (CENTRUM PO) Take 1 tablet by mouth daily.  07/01/2015 at Unknown time  . NITROSTAT 0.4 MG SL tablet Place 0.4 mg under the tongue every 5 (five) minutes as needed.    unknown  . potassium chloride SA (K-DUR,KLOR-CON) 20 MEQ tablet TAKE TWO (2) TABLETS BY MOUTH TWICE DAILY. 120 tablet 6 07/01/2015 at Unknown time  . predniSONE (DELTASONE) 2.5 MG tablet Take 3 tablets (7.5 mg total) by mouth daily with breakfast. 100 tablet 4 07/01/2015 at Unknown time  . SYMBICORT 160-4.5 MCG/ACT inhaler INHALE TWO PUFFS BY MOUTH TWICE DAILY 10.2 g 2 07/01/2015 at Unknown time  . tamsulosin (FLOMAX) 0.4 MG CAPS capsule Take 0.4 mg by mouth daily.   07/01/2015 at Unknown time  . tiotropium (SPIRIVA) 18 MCG inhalation capsule Place 18 mcg into inhaler and inhale daily.   07/01/2015 at Unknown time  . ULORIC 40 MG tablet Take 1 tablet by mouth daily.  6 07/01/2015 at  1800  . warfarin (COUMADIN) 5 MG tablet Take 1 tablet daily except 1/2 tablet on Mondays and Thursdays (Patient taking differently: Take 1 tablet daily except 1/2 tablet on Tuesday, Friday) 45 tablet 4 07/01/2015 at Unknown time   Assessment: 75yo male on chronic Coumadin PTA.  Home dose listed above.  INR is therapeutic and pt has not had Coumadin charted as given since 6/25 when admitted, despite this INR has remained therapeutic.  Most likely due to interaction with Zithromax.    Goal of Therapy:  INR 2-3 Monitor platelets by anticoagulation protocol: Yes   Plan:  Coumadin 2.5mg  PO today x 1 INR daily  Lita Flynn A 07/03/2015,10:37 AM

## 2015-07-03 NOTE — Progress Notes (Addendum)
PROGRESS NOTE    [name]  NGE:[id number] DOB: 06/29/1940 DOA: 07/01/2015 PCP: Juliette AlcideBURDINE,STEVEN E, MD    Brief Narrative:  Patient is a 75 year old man with a history of steroid and oxygen dependent COPD, chronic atrial fibrillation on Coumadin, chronic diastolic heart failure, CAD, and DM-2, who presented to the ED with confusion and shortness of breath. He was placed on BiPAP by the EMS. In the ED, he was febrile with a temperature 102.3. Oxygenating well on BiPAP. Blood pressure was low normal. Lactic acid was elevated at 4.76. White blood cell count was 24.1. Creatinine was 1.46. INR was 2.26. Chest x-ray revealed no acute disease. Urinalysis was negative. Patient was admitted for  sepsis, possibly secondary to pneumonia that had not fluffed out yet and/or cellulitis of the right foot.   Assessment & Plan:   Principal Problem:   Sepsis (HCC) Active Problems:   Acute on chronic respiratory failure (HCC)   Cellulitis of foot, right   Steroid-dependent chronic obstructive pulmonary disease (HCC)   Chronic atrial fibrillation (HCC)   Warfarin anticoagulation   Hyperlipemia   Chronic diastolic CHF (congestive heart failure) (HCC)   Diabetes mellitus with complication (HCC)   Stage III chronic kidney disease   1. Sepsis, possibly secondary to right foot cellulitis. Patient was hypotensive and febrile on admission. He had lactic acidosis. He was started on vigorous IV fluids. Stress dose IV Solu-Cortef was given 24 hours. Eventually, Neo-Synephrine had to be started, but it was titrated off after approximately 12 hours. Blood cultures and a urine culture ordered. Antibiotic therapy was started with azithromycin and Rocephin to cover possible pneumonia and the cellulitis. -Patient's sepsis parameters have improved, but his blood pressure is still somewhat soft. Will continue gentle IV fluids. -Urine culture and blood cultures are still in process. His white blood cell count is trending  down. His fever curve is trending down. -Follow-up chest x-ray revealed no fluffed out pneumonia. X-ray of his foot revealed no radiographic evidence of osteomyelitis.  Right foot ulcer with cellulitis. As above, patient was started on Rocephin 2 g IV daily. The ulcer does not appear to be draining and is healing. The erythema compared to yesterday has subsided.  Acute on chronic hypoxic respiratory failure secondary to steroid-dependent COPD. Chest x-ray results noted. Patient had to be treated with BiPAP during the first 24 hours. ABG was unobtainable. He was restarted on his chronic inhalers and oxygen. Solu-Cortef was given for stress purposes and blood pressure support. Prednisone has been restarted. As needed albuterol nebulizer ordered.  -He is improving on oxygen only.  Chronic atrial fibrillation. Patient's INR was therapeutic on admission. He is treated with Coumadin for anticoagulation and digoxin and metoprolol for rate control. Metoprolol was held on admission due to hypotension. His heart rate has been intermittently elevated, likely from sepsis, but improving. He was restarted on digoxin. -We'll consider small dosing of metoprolol if his heart rate maintains above 120 and his blood pressure can supported.  Chronic diastolic congestive heart failure. Patient is treated chronically with Lasix. It was withheld due to sepsis and hypotension. He does have some pedal edema, but he states that it is no worst and usual. -As his blood pressure continues to improve, will titrate down IV fluids and restart smaller dosing of Lasix as his blood pressure can tolerate it.  Diabetes mellitus with PAD, neuropathy. Patient is treated chronically with metformin. It was held on admission. He is also treated with gabapentin, which was restarted. Sliding scale  NovoLog was started. His CBGs have been reasonable. We'll continue to monitor.  Stage III chronic kidney disease. Patient's creatinine 4  months ago was 1.68 and was 1.46 on admission. There was no documented history of CK D, but he likely has it. As above, IV fluids started. His urine output appears to be nonoliguric. -We'll continue to monitor.   DVT prophylaxis: Coumadin Code Status: Full code Family Communication: Discussed with wife on 07/02/15 Disposition Plan: Discharge to home, likely in the next 24 hours.   Consultants:   None  Procedures:  None  Antimicrobials:  Azithromycin 07/02/15>>  Rocephin 07/02/15>>   Subjective: Patient says that he feels much better. He denies chest pain or shortness of breath. He denies foot pain.  Objective: Filed Vitals:   07/03/15 0720 07/03/15 0800 07/03/15 0817 07/03/15 0818  BP:  106/68    Pulse:  118    Temp: 96.9 F (36.1 C)     TempSrc: Oral     Resp:  24    Height:      Weight:      SpO2:  95% 99% 100%    Intake/Output Summary (Last 24 hours) at 07/03/15 0913 Last data filed at 07/03/15 0818  Gross per 24 hour  Intake 3282.5 ml  Output   1125 ml  Net 2157.5 ml   Filed Weights   07/02/15 0048 07/02/15 0329 07/03/15 0430  Weight: 87.091 kg (192 lb) 90 kg (198 lb 6.6 oz) 91.9 kg (202 lb 9.6 oz)    Examination:  General exam: Appears calm and comfortable  Respiratory system: A few crackles heard in the bases. Respiratory effort normal. Cardiovascular system: Irregular, irregular. Trace to 1+ equal edema bilaterally. Gastrointestinal system: Abdomen is mildly obese nondistended, soft and nontender. No organomegaly or masses felt. Normal bowel sounds heard. Central nervous system: Alert and oriented. No focal neurological deficits. Extremities: Symmetric 5 x 5 power. Skin: Right foot; lateral approximating the fifth metatarsal, there is an ulcerated lesion with some scab over it, but with mild surrounding erythema; no fluctuance; no drainage. Psychiatry: Judgement and insight appear normal. Mood & affect appropriate.     Data Reviewed: I have  personally reviewed following labs and imaging studies  CBC:  Recent Labs Lab 07/01/15 2338 07/02/15 0617 07/03/15 0402  WBC 24.1* 25.6* 16.2*  NEUTROABS 21.8*  --   --   HGB 15.2 12.8* 11.8*  HCT 45.8 39.8 36.5*  MCV 95.0 95.9 94.3  PLT 221 202 194   Basic Metabolic Panel:  Recent Labs Lab 07/01/15 2338 07/02/15 0617 07/03/15 0402  NA 138 138 139  K 4.1 4.1 3.5  CL 99* 104 106  CO2 28 26 26   GLUCOSE 133* 182* 143*  BUN 19 19 29*  CREATININE 1.46* 1.44* 1.52*  CALCIUM 8.4* 7.7* 7.6*   GFR: Estimated Creatinine Clearance: 45.6 mL/min (by C-G formula based on Cr of 1.52). Liver Function Tests:  Recent Labs Lab 07/01/15 2338 07/02/15 0617  AST 24 21  ALT 19 13*  ALKPHOS 36* 28*  BILITOT 0.5 0.8  PROT 6.6 5.5*  ALBUMIN 3.8 3.1*   No results for input(s): LIPASE, AMYLASE in the last 168 hours. No results for input(s): AMMONIA in the last 168 hours. Coagulation Profile:  Recent Labs Lab 07/01/15 2354 07/02/15 0617 07/03/15 0402  INR 2.26* 2.55* 2.56*   Cardiac Enzymes:  Recent Labs Lab 07/01/15 2338  TROPONINI <0.03   BNP (last 3 results) No results for input(s): PROBNP in the last 8760  hours. HbA1C: No results for input(s): HGBA1C in the last 72 hours. CBG:  Recent Labs Lab 07/02/15 0732 07/02/15 1109 07/02/15 1625 07/02/15 2119 07/03/15 0717  GLUCAP 156* 140* 134* 128* 97   Lipid Profile: No results for input(s): CHOL, HDL, LDLCALC, TRIG, CHOLHDL, LDLDIRECT in the last 72 hours. Thyroid Function Tests:  Recent Labs  07/02/15 0325  TSH 1.328   Anemia Panel: No results for input(s): VITAMINB12, FOLATE, FERRITIN, TIBC, IRON, RETICCTPCT in the last 72 hours. Sepsis Labs:  Recent Labs Lab 07/01/15 2345 07/02/15 0325 07/02/15 0617  LATICACIDVEN 4.76* 3.5* 2.7*    Recent Results (from the past 240 hour(s))  Culture, blood (routine x 2)     Status: None (Preliminary result)   Collection Time: 07/02/15 12:00 AM  Result Value  Ref Range Status   Specimen Description BLOOD LEFT ARM  Final   Special Requests BOTTLES DRAWN AEROBIC AND ANAEROBIC 4CC EACH  Final   Culture NO GROWTH < 12 HOURS  Final   Report Status PENDING  Incomplete  Culture, blood (routine x 2)     Status: None (Preliminary result)   Collection Time: 07/02/15 12:15 AM  Result Value Ref Range Status   Specimen Description BLOOD RIGHT ARM  Final   Special Requests BOTTLES DRAWN AEROBIC AND ANAEROBIC 6CC EACH  Final   Culture NO GROWTH < 12 HOURS  Final   Report Status PENDING  Incomplete  MRSA PCR Screening     Status: None   Collection Time: 07/02/15  3:14 AM  Result Value Ref Range Status   MRSA by PCR NEGATIVE NEGATIVE Final    Comment:        The GeneXpert MRSA Assay (FDA approved for NASAL specimens only), is one component of a comprehensive MRSA colonization surveillance program. It is not intended to diagnose MRSA infection nor to guide or monitor treatment for MRSA infections.          Radiology Studies: Dg Chest Port 1 View  07/02/2015  CLINICAL DATA:  Chest congestion. EXAM: PORTABLE CHEST 1 VIEW COMPARISON:  Radiograph of July 02, 2015. FINDINGS: Stable cardiomegaly. No pneumothorax or pleural effusion is noted. Both lungs are clear. The visualized skeletal structures are unremarkable. IMPRESSION: No acute cardiopulmonary abnormality seen. Electronically Signed   By: Lupita Raider, M.D.   On: 07/02/2015 10:00   Dg Chest Port 1 View  07/02/2015  CLINICAL DATA:  Confusion, lightheadedness and dyspnea for 1 day. EXAM: PORTABLE CHEST 1 VIEW COMPARISON:  02/11/2015 FINDINGS: Unchanged mild cardiomegaly. The lungs are clear. The pulmonary vasculature is normal. No large effusions. IMPRESSION: No active disease. Electronically Signed   By: Ellery Plunk M.D.   On: 07/02/2015 00:43   Dg Foot Complete Right  07/02/2015  CLINICAL DATA:  Right foot wound. EXAM: RIGHT FOOT COMPLETE - 3+ VIEW COMPARISON:  None. FINDINGS: There is  no evidence of fracture or dislocation. There is no evidence of arthropathy or other focal bone abnormality. Vascular calcifications are noted. Soft tissue ulcer is seen in soft tissues lateral to the fifth metatarsophalangeal joint. IMPRESSION: Ulceration is seen involving the soft tissues lateral to fifth metatarsophalangeal joint. No definite bony abnormality is noted. Electronically Signed   By: Lupita Raider, M.D.   On: 07/02/2015 10:05        Scheduled Meds: . aspirin EC  81 mg Oral Daily  . azithromycin  500 mg Intravenous Q24H  . cefTRIAXone (ROCEPHIN)  IV  2 g Intravenous Q24H  . digoxin  62.5 mcg Oral Daily  . docusate sodium  100 mg Oral BID  . febuxostat  40 mg Oral Daily  . finasteride  5 mg Oral Daily  . gabapentin  600 mg Oral TID  . insulin aspart  0-15 Units Subcutaneous TID WC  . insulin aspart  0-5 Units Subcutaneous QHS  . mometasone-formoterol  2 puff Inhalation BID  . potassium chloride SA  10 mEq Oral Daily  . pravastatin  40 mg Oral q1800  . predniSONE  7.5 mg Oral Q breakfast  . sodium chloride flush  3 mL Intravenous Q12H  . tamsulosin  0.4 mg Oral Daily  . tiotropium  18 mcg Inhalation Daily   Continuous Infusions: . 0.9 % NaCl with KCl 20 mEq / L 75 mL/hr at 07/03/15 0701     LOS: 1 day    Time spent: 35 minutes    Elliot Cousin, MD [facility] Pager [phone]  If 7PM-7AM, please contact night-coverage [email or web address] Password [email or web address] 07/03/2015, 9:13 AM

## 2015-07-03 NOTE — Progress Notes (Signed)
Called report to RN on [phone]. Will be taking up by wheelchair.

## 2015-07-03 NOTE — Evaluation (Signed)
Physical Therapy Evaluation Patient Details Name: [name] MRN: [id number] DOB: 1940-07-23 Today's Date: 07/03/2015   History of Present Illness  75 yo M admitted with rapid onset of SOB, and confusion Dx: Sepsis, and acute on chronic respiratory failure, R LE cellulitis with diabetic wound on the lateral aspect of his R foot by 5th met head. PMH: COPD, chronic Afib, CAD, HTN, chronic diastolic CHF, BPH, DM2 with neuropathy, Obesity, secondary pulmonary HTn, PAD, rotator cuff repair, knee surgery, coronary stent placement  Clinical Impression  Pt received in bed, wife present, and pt is agreeable to PT evaluation.  Pt stated that he normally ambulates with a rollator, and he is independent with dressing, but wife assists with lower body bathing.  Due to pt's foot wound, PT initiated conversation regarding appropriate footwear.  Pt states that he wears sandals because he cannot find a pair of shoes that allow enough room in the toe box.  He has been fitted for diabetic shoes in the past, but refuses to wear them, saying that they don't fit well.  Pt is also supposed to wear B AFO's due to B foot drop, but he does not wear them.  Pt became very defensive during this conversation.  PT also emphasized importance of skin checks, especially of his feet and lower extremities.    During evaluation, pt was able to ambulate 34f with RW and Min guard.  He demonstrates steppage gait due to B foot drop.  Pt is at high risk for future wounds on his feet due to combination of DM, peripheral neuropathy, PVD, as well as inappropriate footwear with not wearing his AFO's.  Pt is recommended for continued HHPT upon d/c.      Follow Up Recommendations Home health PT    Equipment Recommendations  None recommended by PT    Recommendations for Other Services       Precautions / Restrictions Precautions Precautions: Fall Required Braces or Orthoses: Other Brace/Splint (Pt states he has B AFO's at home, but  does not wear them) Restrictions Weight Bearing Restrictions: No      Mobility  Bed Mobility Overal bed mobility: Needs Assistance Bed Mobility: Supine to Sit     Supine to sit: Min guard;HOB elevated        Transfers Overall transfer level: Needs assistance Equipment used: Rolling walker (2 wheeled) Transfers: Sit to/from Stand Sit to Stand: Min guard            Ambulation/Gait Ambulation/Gait assistance: Min guard Ambulation Distance (Feet): 300 Feet Assistive device: Rolling walker (2 wheeled) Gait Pattern/deviations: Steppage;Step-through pattern        Stairs            Wheelchair Mobility    Modified Rankin (Stroke Patients Only)       Balance Overall balance assessment: Needs assistance Sitting-balance support: No upper extremity supported       Standing balance support: Bilateral upper extremity supported Standing balance-Leahy Scale: Fair                               Pertinent Vitals/Pain Pain Assessment: No/denies pain    Home Living   Living Arrangements: Spouse/significant other   Type of Home: House Home Access: Ramped entrance     Home Layout: One level Home Equipment: Walker - 4 wheels;Shower seat;Bedside commode      Prior Function     Gait / Transfers Assistance Needed: Pt ambulates with  a Rollator at home.    ADL's / Homemaking Assistance Needed: Pt is able to dress himself, but requires assistance from wife for bathing lower extremities.          Hand Dominance   Dominant Hand: Right    Extremity/Trunk Assessment   Upper Extremity Assessment: Generalized weakness           Lower Extremity Assessment: Generalized weakness;RLE deficits/detail;LLE deficits/detail RLE Deficits / Details: DF: 0/5 LLE Deficits / Details: DF: 0/5     Communication   Communication: No difficulties  Cognition Arousal/Alertness: Awake/alert Behavior During Therapy: WFL for tasks assessed/performed Overall  Cognitive Status: Within Functional Limits for tasks assessed                      General Comments      Exercises        Assessment/Plan    PT Assessment Patient needs continued PT services  PT Diagnosis Difficulty walking;Abnormality of gait;Generalized weakness   PT Problem List Decreased strength;Decreased activity tolerance;Decreased balance;Decreased mobility;Decreased coordination;Decreased knowledge of use of DME;Decreased safety awareness;Decreased knowledge of precautions;Cardiopulmonary status limiting activity  PT Treatment Interventions DME instruction;Gait training;Functional mobility training;Therapeutic activities;Therapeutic exercise;Balance training;Neuromuscular re-education;Patient/family education   PT Goals (Current goals can be found in the Care Plan section) Acute Rehab PT Goals Patient Stated Goal: Pt expressed he wants to go home.  PT Goal Formulation: With patient Time For Goal Achievement: 07/10/15 Potential to Achieve Goals: Good    Frequency Min 3X/week   Barriers to discharge        Co-evaluation               End of Session Equipment Utilized During Treatment: Gait belt Activity Tolerance: Patient tolerated treatment well Patient left: in chair Nurse Communication: Mobility status    Functional Assessment Tool Used: [employer] "6-clicks"  Functional Limitation: Mobility: Walking and moving around Mobility: Walking and Moving Around Current Status [phone]): At least 40 percent but less than 60 percent impaired, limited or restricted Mobility: Walking and Moving Around Goal Status [phone]): At least 20 percent but less than 40 percent impaired, limited or restricted    Time: 0981-1914 PT Time Calculation (min) (ACUTE ONLY): 32 min   Charges:   PT Evaluation $PT Eval Low Complexity: 1 Procedure PT Treatments $Gait Training: 8-22 mins   PT G Codes:   PT G-Codes **NOT FOR INPATIENT CLASS** Functional  Assessment Tool Used: [employer] "6-clicks"  Functional Limitation: Mobility: Walking and moving around Mobility: Walking and Moving Around Current Status [phone]): At least 40 percent but less than 60 percent impaired, limited or restricted Mobility: Walking and Moving Around Goal Status [phone]): At least 20 percent but less than 40 percent impaired, limited or restricted    Beth Ronell Duffus, PT, DPT X: [phone]

## 2015-07-03 NOTE — Consult Note (Signed)
   [facility]   07/03/2015  Meredeth IdeJames C Berkery 11/26/1940 [id number]  Spoke with patient and wife, Nelva Bushorma at bedside. [facility]ed [facility]. Patient declined services at this time, but would appreciate brochure for future reference. Of note, [facility] program services would not interfere or replace any discharge services arranged by inpatient case management or social work. For questions contact:  Alben SpittleMary E. Albertha GheeNiemczura, RN, BSN, [facility]   Hospital Liaison [facility] [phone] Business Cell  [phone]) Toll Free Office

## 2015-07-03 NOTE — Care Management Note (Signed)
Case Management Note  Patient Details  Name: Meredeth IdeJames C Granholm MRN: [id number] Date of Birth: 01/07/1940  Subjective/Objective:                  Pt is from home, lives with his wife and [facility]. PT has seen pt and recommended HH PT. Pt is agreeable to having PT services added onto [facility] Alroy BailiffLinda Lothian, of [facility], made aware of admission and will obtain pt info from chart. Pt is aware HH has 48 hours to resume services. Pt states he has rollator and has no DME needs. Pt has PCP, transportation and no difficulty affording his medications. Pt's wife at bedside.  Action/Plan: Pt discharging home with resumption of HH services on 07/04/2015  Expected Discharge Date:     07/04/2015             Expected Discharge Plan:  Home w Home Health Services  In-House Referral:  NA  Discharge planning Services  CM Consult  Post Acute Care Choice:  Home Health, Resumption of Svcs/PTA Provider Choice offered to:  Patient  DME Arranged:    DME Agency:     HH Arranged:  RN, PT HH Agency:  [facility]  Status of Service:  In process, will continue to follow  If discussed at Long Length of Stay Meetings, dates discussed:    Additional Comments:  Malcolm MetroChildress, Myia Bergh Demske, RN 07/03/2015, 2:11 PM

## 2015-07-04 DIAGNOSIS — L03115 Cellulitis of right lower limb: Secondary | ICD-10-CM

## 2015-07-04 DIAGNOSIS — A419 Sepsis, unspecified organism: Principal | ICD-10-CM

## 2015-07-04 LAB — PROTIME-INR
INR: 2.03 — ABNORMAL HIGH (ref 0.00–1.49)
Prothrombin Time: 22.8 seconds — ABNORMAL HIGH (ref 11.6–15.2)

## 2015-07-04 LAB — CBC
HCT: 37.4 % — ABNORMAL LOW (ref 39.0–52.0)
Hemoglobin: 12.2 g/dL — ABNORMAL LOW (ref 13.0–17.0)
MCH: 31 pg (ref 26.0–34.0)
MCHC: 32.6 g/dL (ref 30.0–36.0)
MCV: 94.9 fL (ref 78.0–100.0)
Platelets: 210 10*3/uL (ref 150–400)
RBC: 3.94 MIL/uL — ABNORMAL LOW (ref 4.22–5.81)
RDW: 14.4 % (ref 11.5–15.5)
WBC: 13.1 10*3/uL — ABNORMAL HIGH (ref 4.0–10.5)

## 2015-07-04 LAB — BASIC METABOLIC PANEL
Anion gap: 6 (ref 5–15)
BUN: 23 mg/dL — ABNORMAL HIGH (ref 6–20)
CO2: 27 mmol/L (ref 22–32)
Calcium: 8 mg/dL — ABNORMAL LOW (ref 8.9–10.3)
Chloride: 108 mmol/L (ref 101–111)
Creatinine, Ser: 1.33 mg/dL — ABNORMAL HIGH (ref 0.61–1.24)
GFR calc Af Amer: 58 mL/min — ABNORMAL LOW (ref >60)
GFR calc non Af Amer: 50 mL/min — ABNORMAL LOW (ref >60)
Glucose, Bld: 87 mg/dL (ref 65–99)
Potassium: 4 mmol/L (ref 3.5–5.1)
Sodium: 141 mmol/L (ref 135–145)

## 2015-07-04 LAB — GLUCOSE, CAPILLARY
GLUCOSE-CAPILLARY: 96 mg/dL (ref 65–99)
Glucose-Capillary: 86 mg/dL (ref 65–99)

## 2015-07-04 MED ORDER — CEPHALEXIN 500 MG PO CAPS: 500.0000 mg | ORAL_CAPSULE | Freq: Three times a day (TID) | ORAL | Status: DC

## 2015-07-04 MED ORDER — METOPROLOL SUCCINATE ER 25 MG PO TB24: 12.5000 mg | ORAL_TABLET | Freq: Every day | ORAL | Status: DC

## 2015-07-04 MED ORDER — WARFARIN SODIUM 5 MG PO TABS: 5.0000 mg | ORAL_TABLET | Freq: Once | ORAL | Status: DC

## 2015-07-04 MED ORDER — AZITHROMYCIN 250 MG PO TABS: 500.0000 mg | ORAL_TABLET | Freq: Every day | ORAL | Status: DC

## 2015-07-04 NOTE — Care Management Note (Signed)
Case Management Note  Patient Details  Name: [name] MRN: [id number] Date of Birth: 12/02/1940  Expected Discharge Date:     07/04/2015             Expected Discharge Plan:  Home w Home Health Services  In-House Referral:  NA  Discharge planning Services  CM Consult  Post Acute Care Choice:  Home Health, Resumption of Svcs/PTA Provider Choice offered to:  Patient  DME Arranged:    DME Agency:     HH Arranged:  RN, PT HH Agency:  [facility]  Status of Service:  Completed, signed off  If discussed at Long Length of Stay Meetings, dates discussed:    Additional Comments: [facility]. Pt aware HH has 48 hours to resume services.   [name], [name], RN 07/04/2015, 12:16 PM

## 2015-07-04 NOTE — Progress Notes (Signed)
ANTICOAGULATION CONSULT NOTE   Pharmacy Consult for Coumadin (chronic Rx PTA) Indication: atrial fibrillation  Allergies  Allergen Reactions  . Clopidogrel Bisulfate     REACTION: rash   Patient Measurements: Height: 5\' 9"  (175.3 cm) Weight: 202 lb 9.6 oz (91.9 kg) IBW/kg (Calculated) : 70.7  Vital Signs: Temp: 98.4 F (36.9 C) (06/28 0621) Temp Source: Oral (06/28 0621) BP: 136/77 mmHg (06/28 0621) Pulse Rate: 109 (06/28 0621)  Labs:  Recent Labs  07/01/15 2338  07/02/15 0617 07/03/15 0402 07/04/15 0602  HGB 15.2  --  12.8* 11.8* 12.2*  HCT 45.8  --  39.8 36.5* 37.4*  PLT 221  --  202 194 210  LABPROT  --   < > 27.1* 27.1* 22.8*  INR  --   < > 2.55* 2.56* 2.03*  CREATININE 1.46*  --  1.44* 1.52* 1.33*  TROPONINI <0.03  --   --   --   --   < > = values in this interval not displayed. Estimated Creatinine Clearance: 52.1 mL/min (by C-G formula based on Cr of 1.33).  Medical History: Past Medical History  Diagnosis Date  . COPD with emphysema (HCC)     Followed by Pulmonary  . Chronic atrial fibrillation (HCC)   . Right ventricular dysfunction   . CAD (coronary artery disease)     DES to RCA 2004, moderate residual LAD diisease  . Hyperlipemia   . Obesity   . Hypertension   . Drug allergy     Plavix causes severe rash  . Secondary pulmonary hypertension (HCC)     Moderate in setting of COPD  . Chronic diastolic CHF (congestive heart failure) (HCC)   . Peripheral arterial disease (HCC)   . Type 2 diabetes mellitus with diabetic neuropathy (HCC)    Medications:  Prescriptions prior to admission  Medication Sig Dispense Refill Last Dose  . albuterol (PROVENTIL) (2.5 MG/3ML) 0.083% nebulizer solution Take 3 mLs (2.5 mg total) by nebulization every 6 (six) hours as needed for wheezing. 75 mL 11 Past Month at Unknown time  . albuterol (VENTOLIN HFA) 108 (90 Base) MCG/ACT inhaler Inhale 2 puffs into the lungs every 6 (six) hours as needed for wheezing. 1 Inhaler  6 unknown  . aspirin 81 MG tablet Take 81 mg by mouth daily.     07/01/2015 at Unknown time  . calcium elemental as carbonate (PX ANTACID MAXIMUM STRENGTH) 400 MG tablet Chew 400 mg by mouth as needed.   unknown  . DIGOX 125 MCG tablet TAKE ONE-HALF TABLET BY MOUTH DAILY 45 tablet 3 07/02/2015 at Unknown time  . docusate sodium (STOOL SOFTENER) 100 MG capsule Take 100 mg by mouth 2 (two) times daily.   07/01/2015 at Unknown time  . finasteride (PROSCAR) 5 MG tablet Take 5 mg by mouth daily.  3 07/01/2015 at Unknown time  . furosemide (LASIX) 80 MG tablet TAKE 1 AND 1/2 TABLETS BY MOUTH EVERY MORNING AND TAKE ONE TABLET BY MOUTH EVERY EVENING. 75 tablet 3 07/01/2015 at Unknown time  . gabapentin (NEURONTIN) 600 MG tablet Take 2 tablets by mouth 3 (three) times daily.  6 07/01/2015 at Unknown time  . lovastatin (MEVACOR) 40 MG tablet Take 40 mg by mouth at bedtime.   07/01/2015 at Unknown time  . metFORMIN (GLUCOPHAGE) 500 MG tablet Take 500 mg by mouth 2 (two) times daily.    07/01/2015 at Unknown time  . metoprolol (LOPRESSOR) 50 MG tablet Take 25 mg by mouth 2 (two) times daily.  07/01/2015 at 1800  . Multiple Vitamins-Minerals (CENTRUM PO) Take 1 tablet by mouth daily.     07/01/2015 at Unknown time  . NITROSTAT 0.4 MG SL tablet Place 0.4 mg under the tongue every 5 (five) minutes as needed.    unknown  . potassium chloride SA (K-DUR,KLOR-CON) 20 MEQ tablet TAKE TWO (2) TABLETS BY MOUTH TWICE DAILY. 120 tablet 6 07/01/2015 at Unknown time  . predniSONE (DELTASONE) 2.5 MG tablet Take 3 tablets (7.5 mg total) by mouth daily with breakfast. 100 tablet 4 07/01/2015 at Unknown time  . SYMBICORT 160-4.5 MCG/ACT inhaler INHALE TWO PUFFS BY MOUTH TWICE DAILY 10.2 g 2 07/01/2015 at Unknown time  . tamsulosin (FLOMAX) 0.4 MG CAPS capsule Take 0.4 mg by mouth daily.   07/01/2015 at Unknown time  . tiotropium (SPIRIVA) 18 MCG inhalation capsule Place 18 mcg into inhaler and inhale daily.   07/01/2015 at Unknown time  .  ULORIC 40 MG tablet Take 1 tablet by mouth daily.  6 07/01/2015 at 1800  . warfarin (COUMADIN) 5 MG tablet Take 1 tablet daily except 1/2 tablet on Mondays and Thursdays (Patient taking differently: Take 1 tablet daily except 1/2 tablet on Tuesday, Friday) 45 tablet 4 07/01/2015 at Unknown time   Assessment: 75yo male on chronic Coumadin PTA.  Home dose listed above.  INR is therapeutic.   Pt did not take Coumadin 6/25 or 6/26.  Despite this INR has remained therapeutic.  Most likely due to interaction with Zithromax.    Goal of Therapy:  INR 2-3 Monitor platelets by anticoagulation protocol: Yes   Plan:  Coumadin 5mg  PO today x 1 (home dose) INR daily  Margo AyeHall, Gean Larose A 07/04/2015,8:36 AM

## 2015-07-04 NOTE — Care Management Important Message (Signed)
Important Message  Patient Details  Name: [name] MRN: [id number] Date of Birth: 03/23/1940   Medicare Important Message Given:  Yes    Malcolm MetroChildress, Tynan Boesel Demske, RN 07/04/2015, 12:16 PM

## 2015-07-04 NOTE — Discharge Summary (Signed)
Physician Discharge Summary  [name] [id number] DOB: 08/09/1940 DOA: 07/01/2015  PCP: Juliette AlcideBURDINE,STEVEN E, MD  Admit date: 07/01/2015 Discharge date: 07/04/2015  Time spent: 45 minutes  Recommendations for Outpatient Follow-up:  -Will be discharged home today. -Advised to follow-up with primary care provider in 2 weeks.   Discharge Diagnoses:  Principal Problem:   Sepsis (HCC) Active Problems:   Chronic atrial fibrillation (HCC)   Warfarin anticoagulation   Hyperlipemia   Acute on chronic respiratory failure (HCC)   Chronic diastolic CHF (congestive heart failure) (HCC)   Diabetes mellitus with complication (HCC)   Steroid-dependent chronic obstructive pulmonary disease (HCC)   Cellulitis of foot, right   Stage III chronic kidney disease   Discharge Condition: Stable and improved  Filed Weights   07/02/15 0048 07/02/15 0329 07/03/15 0430  Weight: 87.091 kg (192 lb) 90 kg (198 lb 6.6 oz) 91.9 kg (202 lb 9.6 oz)    History of present illness:  As per Dr. Conley RollsLe on 6/26: [name] is a 75 y.o. male with medical history significant of COPD, chronic a fib on Coumadin, CAD, HLD, HTN, chronic diastolic CHF, BPH, and DM type 2, presents via ambulance with complaints of rapid onset, worsening SOB onset this night after dinner. He reports having been in his usual state of health until this night around dinner time when he developed a productive cough. He reports that he normally ambulates well with walker, but needed assistance tonight after his SOB quickly worsened. He has had a fever, and elevated WBC, though he takes prednisone daily. When EMS arrived, he was found to be confused and was placed on BiPAP. He denies CP.   ED Course: Since arriving in the ED, his SOB has improved with BiPAP. Bloodwork showed an elevated creatinine. BNP and troponins were negative. Lactic acid returned at 4.76 ad WBC was elevated 24.1. He was started on IV Rocephin and Zithromax, and blood  cultures were collected and sent. CXR showed no active diseases. Hospitalist was asked to admit for further management of sepsis, and acute on chronic respiratory failure.   Hospital Course:   Sepsis, possibly secondary to right foot cellulitis -Patient was hypotensive and febrile on admission with lactic acidosis, did require pressors transiently, it was initially thought he might have a pneumonia that hadn't yet fluffed out on chest x-ray, however repeat chest x-ray does not show evidence for pneumonia.  -X-ray of his foot did not reveal radiographic evidence of osteomyelitis, on discharge 4 more days of Keflex will be prescribed. He has been responding well to Rocephin while in the hospital.  Acute on chronic hypoxemic respiratory failure secondary to steroid-dependent COPD -Did require BiPAP transiently, ABG was unobtainable, he uses oxygen at night and is on his home regimen.  Chronic atrial fibrillation  -continue Coumadin for anticoagulation as well as digoxin and metoprolol for rate control.  Chronic diastolic CHF -Compensated  Stage III chronic kidney disease -Creatinine has been at baseline of between 1.4-1.7  Type 2 diabetes -Fair control, continue outpatient management  Procedures:  None   Consultations:  None  Discharge Instructions  Discharge Instructions    Diet - low sodium heart healthy    Complete by:  As directed      Increase activity slowly    Complete by:  As directed             Medication List    STOP taking these medications        metoprolol 50  MG tablet  Commonly known as:  LOPRESSOR      TAKE these medications        albuterol (2.5 MG/3ML) 0.083% nebulizer solution  Commonly known as:  PROVENTIL  Take 3 mLs (2.5 mg total) by nebulization every 6 (six) hours as needed for wheezing.     albuterol 108 (90 Base) MCG/ACT inhaler  Commonly known as:  VENTOLIN HFA  Inhale 2 puffs into the lungs every 6 (six) hours as needed for wheezing.       aspirin 81 MG tablet  Take 81 mg by mouth daily.     CENTRUM PO  Take 1 tablet by mouth daily.     cephALEXin 500 MG capsule  Commonly known as:  KEFLEX  Take 1 capsule (500 mg total) by mouth 3 (three) times daily.     DIGOX 0.125 MG tablet  Generic drug:  digoxin  TAKE ONE-HALF TABLET BY MOUTH DAILY     finasteride 5 MG tablet  Commonly known as:  PROSCAR  Take 5 mg by mouth daily.     furosemide 80 MG tablet  Commonly known as:  LASIX  TAKE 1 AND 1/2 TABLETS BY MOUTH EVERY MORNING AND TAKE ONE TABLET BY MOUTH EVERY EVENING.     gabapentin 600 MG tablet  Commonly known as:  NEURONTIN  Take 2 tablets by mouth 3 (three) times daily.     lovastatin 40 MG tablet  Commonly known as:  MEVACOR  Take 40 mg by mouth at bedtime.     metFORMIN 500 MG tablet  Commonly known as:  GLUCOPHAGE  Take 500 mg by mouth 2 (two) times daily.     metoprolol succinate 25 MG 24 hr tablet  Commonly known as:  TOPROL-XL  Take 0.5 tablets (12.5 mg total) by mouth daily.     NITROSTAT 0.4 MG SL tablet  Generic drug:  nitroGLYCERIN  Place 0.4 mg under the tongue every 5 (five) minutes as needed.     potassium chloride SA 20 MEQ tablet  Commonly known as:  K-DUR,KLOR-CON  TAKE TWO (2) TABLETS BY MOUTH TWICE DAILY.     predniSONE 2.5 MG tablet  Commonly known as:  DELTASONE  Take 3 tablets (7.5 mg total) by mouth daily with breakfast.     PX ANTACID MAXIMUM STRENGTH 400 MG chewable tablet  Generic drug:  calcium elemental as carbonate  Chew 400 mg by mouth as needed.     STOOL SOFTENER 100 MG capsule  Generic drug:  docusate sodium  Take 100 mg by mouth 2 (two) times daily.     SYMBICORT 160-4.5 MCG/ACT inhaler  Generic drug:  budesonide-formoterol  INHALE TWO PUFFS BY MOUTH TWICE DAILY     tamsulosin 0.4 MG Caps capsule  Commonly known as:  FLOMAX  Take 0.4 mg by mouth daily.     tiotropium 18 MCG inhalation capsule  Commonly known as:  SPIRIVA  Place 18 mcg into inhaler  and inhale daily.     ULORIC 40 MG tablet  Generic drug:  febuxostat  Take 1 tablet by mouth daily.     warfarin 5 MG tablet  Commonly known as:  COUMADIN  Take 1 tablet daily except 1/2 tablet on Mondays and Thursdays       Allergies  Allergen Reactions  . Clopidogrel Bisulfate     REACTION: rash       Follow-up Information    Follow up with [facility].   Contact information:  [address] [place] [address] [phone]       Follow up with Juliette Alcide, MD On 07/17/2015.   Specialty:  Family Medicine   Why:  at 1:30 pm   Contact information:   [address] [place] [address] [phone]        The results of significant diagnostics from this hospitalization (including imaging, microbiology, ancillary and laboratory) are listed below for reference.    Significant Diagnostic Studies: Dg Chest Port 1 View  07/02/2015  CLINICAL DATA:  Chest congestion. EXAM: PORTABLE CHEST 1 VIEW COMPARISON:  Radiograph of July 02, 2015. FINDINGS: Stable cardiomegaly. No pneumothorax or pleural effusion is noted. Both lungs are clear. The visualized skeletal structures are unremarkable. IMPRESSION: No acute cardiopulmonary abnormality seen. Electronically Signed   By: Lupita Raider, M.D.   On: 07/02/2015 10:00   Dg Chest Port 1 View  07/02/2015  CLINICAL DATA:  Confusion, lightheadedness and dyspnea for 1 day. EXAM: PORTABLE CHEST 1 VIEW COMPARISON:  02/11/2015 FINDINGS: Unchanged mild cardiomegaly. The lungs are clear. The pulmonary vasculature is normal. No large effusions. IMPRESSION: No active disease. Electronically Signed   By: Ellery Plunk M.D.   On: 07/02/2015 00:43   Dg Foot Complete Right  07/02/2015  CLINICAL DATA:  Right foot wound. EXAM: RIGHT FOOT COMPLETE - 3+ VIEW COMPARISON:  None. FINDINGS: There is no evidence of fracture or dislocation. There is no evidence of arthropathy or other focal bone abnormality. Vascular  calcifications are noted. Soft tissue ulcer is seen in soft tissues lateral to the fifth metatarsophalangeal joint. IMPRESSION: Ulceration is seen involving the soft tissues lateral to fifth metatarsophalangeal joint. No definite bony abnormality is noted. Electronically Signed   By: Lupita Raider, M.D.   On: 07/02/2015 10:05    Microbiology: Recent Results (from the past 240 hour(s))  Culture, blood (routine x 2)     Status: None (Preliminary result)   Collection Time: 07/02/15 12:00 AM  Result Value Ref Range Status   Specimen Description BLOOD LEFT ARM  Final   Special Requests BOTTLES DRAWN AEROBIC AND ANAEROBIC 4CC EACH  Final   Culture NO GROWTH 2 DAYS  Final   Report Status PENDING  Incomplete  Culture, blood (routine x 2)     Status: None (Preliminary result)   Collection Time: 07/02/15 12:15 AM  Result Value Ref Range Status   Specimen Description BLOOD RIGHT ARM  Final   Special Requests BOTTLES DRAWN AEROBIC AND ANAEROBIC 6CC EACH  Final   Culture NO GROWTH 2 DAYS  Final   Report Status PENDING  Incomplete  Urine culture     Status: Abnormal   Collection Time: 07/02/15  1:11 AM  Result Value Ref Range Status   Specimen Description URINE, CLEAN CATCH  Final   Special Requests NONE  Final   Culture (A)  Final    2,000 COLONIES/mL INSIGNIFICANT GROWTH Performed at [facility]    Report Status 07/03/2015 FINAL  Final  MRSA PCR Screening     Status: None   Collection Time: 07/02/15  3:14 AM  Result Value Ref Range Status   MRSA by PCR NEGATIVE NEGATIVE Final    Comment:        The GeneXpert MRSA Assay (FDA approved for NASAL specimens only), is one component of a comprehensive MRSA colonization surveillance program. It is not intended to diagnose MRSA infection nor to guide or monitor treatment for MRSA infections.      Labs:  Basic Metabolic Panel:  Recent Labs Lab 07/01/15 2338 07/02/15 0617 07/03/15 0402 07/04/15 0602  NA 138 138 139 141  K  4.1 4.1 3.5 4.0  CL 99* 104 106 108  CO2 GLUCOSE 133* 182* 143* 87  BUN 19 19 29* 23*  CREATININE 1.46* 1.44* 1.52* 1.33*  CALCIUM 8.4* 7.7* 7.6* 8.0*   Liver Function Tests:  Recent Labs Lab 07/01/15 2338 07/02/15 0617  AST 24 21  ALT 19 13*  ALKPHOS 36* 28*  BILITOT 0.5 0.8  PROT 6.6 5.5*  ALBUMIN 3.8 3.1*   No results for input(s): LIPASE, AMYLASE in the last 168 hours. No results for input(s): AMMONIA in the last 168 hours. CBC:  Recent Labs Lab 07/01/15 2338 07/02/15 0617 07/03/15 0402 07/04/15 0602  WBC 24.1* 25.6* 16.2* 13.1*  NEUTROABS 21.8*  --   --   --   HGB 15.2 12.8* 11.8* 12.2*  HCT 45.8 39.8 36.5* 37.4*  MCV 95.0 95.9 94.3 94.9  PLT 221 202 194 210   Cardiac Enzymes:  Recent Labs Lab 07/01/15 2338  TROPONINI <0.03   BNP: BNP (last 3 results)  Recent Labs  02/11/15 0522 07/01/15 2338  BNP 38.0 54.0    ProBNP (last 3 results) No results for input(s): PROBNP in the last 8760 hours.  CBG:  Recent Labs Lab 07/03/15 1126 07/03/15 1606 07/03/15 2128 07/04/15 0730 07/04/15 1135  GLUCAP 95 117* 99 86 96       Signed:  HERNANDEZ ACOSTA,ESTELA  [facility] Pager: [phone] 07/04/2015, 4:53 PM

## 2015-07-04 NOTE — Progress Notes (Signed)
Pharmacy Antibiotic Note  [name] is a 75 y.o. male admitted on 07/01/2015 with pneumonia.  Pharmacy has been consulted for azithromycin dosing. He is also receiving rocephin 2gm IV q24 hours to cover cellulitis as well.  Afebrile.  WBC better.  Cultures unrevealing.   PHARMACIST - PHYSICIAN COMMUNICATION CONCERNING: Antibiotic IV to Oral Route Change Policy  RECOMMENDATION: This patient is receiving ZITHROMAX by the intravenous route.  Based on criteria approved by the Pharmacy and Therapeutics Committee, the antibiotic(s) is/are being converted to the equivalent oral dose form(s).  DESCRIPTION: These criteria include:  Patient being treated for a respiratory tract infection, urinary tract infection, cellulitis or clostridium difficile associated diarrhea if on metronidazole  The patient is not neutropenic and does not exhibit a GI malabsorption state  The patient is eating (either orally or via tube) and/or has been taking other orally administered medications for a least 24 hours  The patient is improving clinically and has a Tmax < 100.5  If you have questions about this conversion, please contact the Pharmacy Department  [x]   [phone]( [phone] )  [name] []   [phone]( [phone] )  [facility] []   [phone]( [phone] )  [facility] []   [phone]( [phone] )  [facility] []   [phone]( [phone] )  [facility]   Height: 5\' 9"  (175.3 cm) Weight: 202 lb 9.6 oz (91.9 kg) IBW/kg (Calculated) : 70.7  Temp (24hrs), Avg:97.7 F (36.5 C), Min:96.9 F (36.1 C), Max:98.5 F (36.9 C)   Recent Labs Lab 07/01/15 2338 07/01/15 2345 07/02/15 0325 07/02/15 0617 07/03/15 0402 07/04/15 0602  WBC 24.1*  --   --  25.6* 16.2* 13.1*  CREATININE 1.46*  --   --  1.44* 1.52* 1.33*  LATICACIDVEN  --  4.76* 3.5* 2.7*  --   --     Estimated Creatinine Clearance: 52.1 mL/min (by C-G formula based on Cr of 1.33).    Allergies  Allergen Reactions  . Clopidogrel Bisulfate    REACTION: rash   Antimicrobials this admission: rocephin 6/26 >>  azithromycin 6/26 >>   Thank you for allowing pharmacy to be a part of this patient's care.  Valrie HartScott Lamarr Feenstra, PharmD Clinical Pharmacist Pager:  [phone] 07/04/2015 8:34 AM   07/04/2015 8:33 AM

## 2015-07-04 NOTE — Progress Notes (Signed)
Patient discharged home with belongings, prescriptions, and IV removed and site intact.  Patient sent home with wife, HH to follow up.

## 2015-07-05 ENCOUNTER — Other Ambulatory Visit: Payer: Self-pay | Admitting: Cardiology

## 2015-07-08 LAB — CULTURE, BLOOD (ROUTINE X 2)
CULTURE: NO GROWTH
CULTURE: NO GROWTH

## 2015-07-11 ENCOUNTER — Telehealth: Payer: Self-pay | Admitting: *Deleted

## 2015-07-11 NOTE — Telephone Encounter (Signed)
Did not receive INR results on 6/30.  Continue coumadin 5mg  daily except 2.5mg  on Tuesdays and Fridays and recheck INR on 7/10.

## 2015-07-11 NOTE — Telephone Encounter (Signed)
INR 1.4 on 6/30 / did not receive call back with instructions / pls call / tg

## 2015-07-12 ENCOUNTER — Ambulatory Visit: Payer: Medicare Other | Admitting: Cardiology

## 2015-07-16 ENCOUNTER — Telehealth: Payer: Self-pay | Admitting: Cardiology

## 2015-07-16 ENCOUNTER — Ambulatory Visit (INDEPENDENT_AMBULATORY_CARE_PROVIDER_SITE_OTHER): Payer: Medicare Other

## 2015-07-16 DIAGNOSIS — Z5181 Encounter for therapeutic drug level monitoring: Secondary | ICD-10-CM

## 2015-07-16 DIAGNOSIS — I482 Chronic atrial fibrillation, unspecified: Secondary | ICD-10-CM

## 2015-07-16 LAB — POCT INR: INR: 2.3

## 2015-07-16 NOTE — Telephone Encounter (Signed)
Result noted, see anticoagulation note in Epic. 

## 2015-07-16 NOTE — Telephone Encounter (Signed)
Olegario MessierKathy called in patient's inr/pt for today    2.3

## 2015-07-23 ENCOUNTER — Ambulatory Visit (HOSPITAL_COMMUNITY): Payer: Medicare Other | Admitting: Physical Therapy

## 2015-08-01 ENCOUNTER — Telehealth (HOSPITAL_COMMUNITY): Payer: Self-pay | Admitting: Physical Therapy

## 2015-08-01 NOTE — Telephone Encounter (Signed)
Pt called again states that MD wants him to come to [place] for his wound care. Patient was advised again that HomeHealth has to be stopped before we can make an apptment or his insurance will not paytfor both places at the same time. He  w/Call us back. NF

## 2015-08-09 ENCOUNTER — Other Ambulatory Visit: Payer: Self-pay | Admitting: Cardiology

## 2015-08-10 ENCOUNTER — Encounter: Payer: Self-pay | Admitting: Pulmonary Disease

## 2015-08-10 ENCOUNTER — Ambulatory Visit (INDEPENDENT_AMBULATORY_CARE_PROVIDER_SITE_OTHER): Payer: Medicare Other | Admitting: Pulmonary Disease

## 2015-08-10 VITALS — BP 122/76 | HR 76 | Ht 69.0 in | Wt 198.5 lb

## 2015-08-10 DIAGNOSIS — J449 Chronic obstructive pulmonary disease, unspecified: Secondary | ICD-10-CM

## 2015-08-10 DIAGNOSIS — J9611 Chronic respiratory failure with hypoxia: Secondary | ICD-10-CM

## 2015-08-10 DIAGNOSIS — Z7952 Long term (current) use of systemic steroids: Secondary | ICD-10-CM

## 2015-08-10 DIAGNOSIS — I272 Other secondary pulmonary hypertension: Secondary | ICD-10-CM

## 2015-08-10 MED ORDER — PREDNISONE 2.5 MG PO TABS: 5.0000 mg | ORAL_TABLET | Freq: Every day | ORAL | 3 refills | Status: DC

## 2015-08-10 NOTE — Progress Notes (Signed)
Subjective:    Patient ID: [name], male    DOB: [birth year], 75 y.o.   MRN: [id number]  C.C.:  Follow-up for Severe COPD, Pulmonary Hypertension, Chronic Hypoxic Respiratory Failure, & Chronic Steroid Therapy.  HPI  Since last appointment he was hospitalized in June with Sepsis.   Severe COPD: Prescribed Spiriva & Symbicort. No exacerbations since last appointment. He reports chronic productive cough. Reports he is having some white mucus. He is still wheezing intermittently. No nocturnal awakenings with coughing or wheezing. He has used his rescue medication only once since he saw me last.   Pulmonary Hypertension: Multifactorial from known diastolic congestive heart failure as well as COPD. Chronic anticoagulation with Coumadin. He reports his weight is staying stable at 198#. Continuing to take Lasix. He admits he isn't weighing himself every day. He reports his baseline lower extremity edema.   Chronic Hypoxic Respiratory Failure: Prescribed oxygen at 3 L/m during the day along with 2 L/m at night while sleeping. He admits he isn't using his oxygen consistently with exertion despite remembering our conversation at last appointment.   Chronic Steroid Therapy:  Prednisone therapy since 2011. Dose decreased to 7.5 mg at last appointment. He denies any change in his breathing.  Review of Systems  No fever, chills, or sweats. No nausea, emesis, or diarrhea. No chest pain or pressure.  Allergies  Allergen Reactions  . Clopidogrel Bisulfate     REACTION: rash    Current Outpatient Prescriptions on File Prior to Visit  Medication Sig Dispense Refill  . albuterol (PROVENTIL) (2.5 MG/3ML) 0.083% nebulizer solution Take 3 mLs (2.5 mg total) by nebulization every 6 (six) hours as needed for wheezing. 75 mL 11  . albuterol (VENTOLIN HFA) 108 (90 Base) MCG/ACT inhaler Inhale 2 puffs into the lungs every 6 (six) hours as needed for wheezing. 1 Inhaler 6  . aspirin 81 MG tablet Take 81 mg  by mouth daily.      . calcium elemental as carbonate (PX ANTACID MAXIMUM STRENGTH) 400 MG tablet Chew 400 mg by mouth as needed.    . cephALEXin (KEFLEX) 500 MG capsule Take 1 capsule (500 mg total) by mouth 3 (three) times daily. 12 capsule 0  . DIGOX 125 MCG tablet TAKE ONE-HALF TABLET BY MOUTH DAILY 45 tablet 3  . docusate sodium (STOOL SOFTENER) 100 MG capsule Take 100 mg by mouth 2 (two) times daily.    . finasteride (PROSCAR) 5 MG tablet Take 5 mg by mouth daily.  3  . furosemide (LASIX) 80 MG tablet TAKE 1 AND 1/2 TABLETS BY MOUTH EVERY MORNING AND TAKE ONE TABLET BY MOUTH EVERY EVENING. 75 tablet 2  . gabapentin (NEURONTIN) 600 MG tablet Take 2 tablets by mouth 3 (three) times daily.  6  . lovastatin (MEVACOR) 40 MG tablet Take 40 mg by mouth at bedtime.    . metFORMIN (GLUCOPHAGE) 500 MG tablet Take 500 mg by mouth 2 (two) times daily.     . metoprolol succinate (TOPROL-XL) 25 MG 24 hr tablet Take 0.5 tablets (12.5 mg total) by mouth daily. 30 tablet 1  . Multiple Vitamins-Minerals (CENTRUM PO) Take 1 tablet by mouth daily.      Marland Kitchen NITROSTAT 0.4 MG SL tablet Place 0.4 mg under the tongue every 5 (five) minutes as needed.     . potassium chloride SA (K-DUR,KLOR-CON) 20 MEQ tablet TAKE TWO (2) TABLETS BY MOUTH TWICE DAILY. 120 tablet 6  . predniSONE (DELTASONE) 2.5 MG tablet Take 3 tablets (  7.5 mg total) by mouth daily with breakfast. 100 tablet 4  . SYMBICORT 160-4.5 MCG/ACT inhaler INHALE TWO PUFFS BY MOUTH TWICE DAILY 10.2 g 2  . tamsulosin (FLOMAX) 0.4 MG CAPS capsule Take 0.4 mg by mouth daily.    Marland Kitchen tiotropium (SPIRIVA) 18 MCG inhalation capsule Place 18 mcg into inhaler and inhale daily.    Marland Kitchen ULORIC 40 MG tablet Take 1 tablet by mouth daily.  6  . warfarin (COUMADIN) 5 MG tablet Take 1 tablet daily except 1/2 tablet on Mondays and Thursdays (Patient taking differently: Take 1 tablet daily except 1/2 tablet on Tuesday, Friday) 45 tablet 4  . warfarin (COUMADIN) 5 MG tablet TAKE ONE  TABLET BY MOUTH DAILY EXCEPT ONE-HALF TABLET ON MONDAY AND THURSDAY. 45 tablet 3   No current facility-administered medications on file prior to visit.     Past Medical History:  Diagnosis Date  . CAD (coronary artery disease)    DES to RCA 2004, moderate residual LAD diisease  . Chronic atrial fibrillation (HCC)   . Chronic diastolic CHF (congestive heart failure) (HCC)   . COPD with emphysema (HCC)    Followed by Pulmonary  . Drug allergy    Plavix causes severe rash  . Hyperlipemia   . Hypertension   . Obesity   . Peripheral arterial disease (HCC)   . Right ventricular dysfunction   . Secondary pulmonary hypertension (HCC)    Moderate in setting of COPD  . Type 2 diabetes mellitus with diabetic neuropathy [facility])     Past Surgical History:  Procedure Laterality Date  . CATARACT EXTRACTION Bilateral   . CORONARY STENT PLACEMENT  2004  . KNEE SURGERY    . ROTATOR CUFF REPAIR     x 3    Family History  Problem Relation Age of Onset  . Heart disease Mother   . Stroke Mother   . Throat cancer Father   . Lung disease Neg Hx     Social History   Social History  . Marital status: Married    Spouse name: N/A  . Number of children: 3  . Years of education: hs   Occupational History  . Dance movement psychotherapist Retired   Social History Main Topics  . Smoking status: Former Smoker    Packs/day: 2.00    Years: 48.00    Types: Cigarettes    Start date: 02/14/1956    Quit date: 01/06/1998  . Smokeless tobacco: Never Used  . Alcohol use No     Comment: Remote history of use (beer)  . Drug use: No  . Sexual activity: Not Asked   Other Topics Concern  . None   Social History Narrative   Originally from [place]. 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 No international travel. Previously worked for [employer] in [place]. Exposed to hot asphalt. Has a dog & cat. No bird, hot tub or mold exposure. Previously was playing golf before his joint pain became  significant.       Objective:   Physical Exam BP 122/76 (BP Location: Left Arm, Cuff Size: Normal)   Pulse 76   Ht 5\' 9"  (1.753 m)   Wt 198 lb 8 oz (90 kg)   SpO2 98%   BMI 29.31 kg/m  General:  Awake. Wife accompanying the patient today. No distress. Integument:  Warm & dry. No rash on exposed skin. Bruising of various ages on arms. HEENT:  Dry. mucus membranes. No oral  ulcers. No scleral icterus. Mild right nasal turbinate swelling.  Cardiovascular:  Regular rate. Bilateral Pitting edema unchanged. Normal S1 & S2. Pulmonary:  Mild coarse wheeze bilaterally. No accessory muscle use on room air. Good aeration bilaterally. Abdomen: Soft. Normal bowel sounds. Nondistended.  Musculoskeletal:  Normal bulk and tone. 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  PFT 05/10/15: FVC 2.84 L (74%) FEV1 1.29 L (47%) FEV1/FVC 0.45 FEF 25-75 0.52 (26%) negative bronchodilator response                                                                 DLCO uncorrected 45% (unable to perform lung volumes) 08/24/06:FVC 3.15 L (71%) FEV1 1.29 L (43%) FEV1/FVC 0.41 FEF 25-75 0.34 L (12%) positive bronchodilator response TLC 6.79 L (104%) RV 145% ERV 35% DLCO uncorrected 56%  IMAGING CXR PA/LAT 11/09/14 (previously reviewed by me): No focal opacity or effusion appreciated. Mild hyperinflation with flattening of the diaphragms. Heart normal in size & mediastinum normal in contour.   CARDIAC TTE (06/19/11): LV normal in size with mild concentric LVH. EF 55-60%. LA & RA mildly dilated. RV not well visualized. Mild RV dilation with moderately reduced systolic function. RVSP 49 mmHg. No aortic stenosis or regurgitation. No mitral stenosis or regurgitation. No tricuspid regurgitation. No pulmonic regurgitation. No pericardial effusion. Main pulmonary artery not well visualized.  LABS 01/23/15 Alpha-1 antitrypsin: MM (154)  10/25/13 ANA: Negative RF: 10.9    Assessment & Plan:  75  y.o. caucasian male with long-standing prior history of tobacco use. Patient's COPD seems to be very well controlled at this time on his current regimen. He is tolerating a very slow taper of his prednisone and despite signs of mild hypervolemia with lower extremity edema on physical exam today overall appears stable with regards to his pulmonary hypertension. I did spend a significant amount of time today educating the patient on the need for oxygen therapy with ambulation to prevent pulmonary arterial vasospasm with hypoxia. I encouraged the patient to use oxygen as prescribed to prevent worsening of his ventricular function. I instructed the patient to notify my office if he had any new breathing problems or questions before his next appointment.  1. Severe COPD:  Well controlled on Symbicort and Spiriva. No changes at this time. Attempting slow taper of prednisone. 2. Pulmonary Hypertension:  Appears only mildly hypervolemic with lower extremity edema. Patient continuing on systemic anticoagulation and diuretics. No indication for pulmonary arterial vasodilator therapy at this time. 3. Chronic Hypoxic Respiratory Failure:  Patient again encouraged to use oxygen as prescribed. 4. Chronic Prednisone Therapy:  Decreasing dose of prednisone to 5 mg daily. 5. Health maintenance:  S/P Prevnar 11/11/12 & Pneumovax 10/28/07. Recommended influenza vaccine in September. 6. Follow-up: Patient to return to clinic in  3 months or sooner if needed.  Donna Christen Jamison Neighbor, M.D. [facility] Pulmonary & Critical Care Pager:  [phone] After 3pm or if no response, call [phone] 9:06 AM 08/10/15

## 2015-08-10 NOTE — Patient Instructions (Addendum)
   We are decreasing your dose of Prednisone to 5mg  daily. Take 2 of the 2.5mg  pills daily. Call me if you have any problems that you notice in your breathing with this decreased dose.  Continue taking your inhalers and nebulizer medication as prescribed.  I will see you back in 3 months or sooner if needed.

## 2015-08-14 ENCOUNTER — Ambulatory Visit (INDEPENDENT_AMBULATORY_CARE_PROVIDER_SITE_OTHER): Payer: Medicare Other | Admitting: *Deleted

## 2015-08-14 DIAGNOSIS — I482 Chronic atrial fibrillation, unspecified: Secondary | ICD-10-CM

## 2015-08-14 DIAGNOSIS — Z5181 Encounter for therapeutic drug level monitoring: Secondary | ICD-10-CM

## 2015-08-14 LAB — POCT INR: INR: 3.9

## 2015-08-22 ENCOUNTER — Ambulatory Visit (INDEPENDENT_AMBULATORY_CARE_PROVIDER_SITE_OTHER): Payer: Medicare Other | Admitting: Cardiology

## 2015-08-22 ENCOUNTER — Encounter: Payer: Self-pay | Admitting: Cardiology

## 2015-08-22 VITALS — BP 111/68 | HR 63 | Ht 69.0 in | Wt 191.4 lb

## 2015-08-22 DIAGNOSIS — I2510 Atherosclerotic heart disease of native coronary artery without angina pectoris: Secondary | ICD-10-CM | POA: Diagnosis not present

## 2015-08-22 DIAGNOSIS — I2781 Cor pulmonale (chronic): Secondary | ICD-10-CM

## 2015-08-22 DIAGNOSIS — I482 Chronic atrial fibrillation, unspecified: Secondary | ICD-10-CM

## 2015-08-22 DIAGNOSIS — I10 Essential (primary) hypertension: Secondary | ICD-10-CM | POA: Diagnosis not present

## 2015-08-22 NOTE — Progress Notes (Signed)
Cardiology Office Note  Date: 08/22/2015   ID: [name], DOB 1940/08/02, MRN [id number]  PCP: Juliette Alcide, MD  Primary Cardiologist: Nona Dell, MD   Chief Complaint  Patient presents with  . Coronary Artery Disease  . Atrial Fibrillation    History of Present Illness: [name] is a 75 y.o. male last seen in December 2016, a former patient of Dr. Myrtis Ser. He is here today with his wife for a follow-up visit. From a cardiac perspective he has been stable, no active angina symptoms or nitroglycerin use.  He continues on Coumadin with follow-up in the anticoagulation clinic. Does not report any bleeding problems. Dose has been adjusted recently.  He uses a rolling walker, is somewhat limited in terms of his activity. He does not use oxygen during the daytime, only at nighttime. He maintains regular follow-up in the Pulmonary division. Record review finds hospitalization in June of this year with sepsis in the setting of right foot cellulitis, also had acute on chronic hypoxemic respiratory failure.  Past Medical History:  Diagnosis Date  . CAD (coronary artery disease)    DES to RCA 2004, moderate residual LAD diisease  . Chronic atrial fibrillation (HCC)   . Chronic diastolic CHF (congestive heart failure) (HCC)   . COPD with emphysema (HCC)    Followed by Pulmonary  . Drug allergy    Plavix causes severe rash  . Hyperlipemia   . Hypertension   . Obesity   . Peripheral arterial disease (HCC)   . Right ventricular dysfunction   . Secondary pulmonary hypertension (HCC)    Moderate in setting of COPD  . Type 2 diabetes mellitus with diabetic neuropathy [facility])     Past Surgical History:  Procedure Laterality Date  . CATARACT EXTRACTION Bilateral   . CORONARY STENT PLACEMENT  2004  . KNEE SURGERY    . ROTATOR CUFF REPAIR     x 3    Current Outpatient Prescriptions  Medication Sig Dispense Refill  . albuterol (PROVENTIL) (2.5 MG/3ML) 0.083% nebulizer  solution Take 3 mLs (2.5 mg total) by nebulization every 6 (six) hours as needed for wheezing. 75 mL 11  . albuterol (VENTOLIN HFA) 108 (90 Base) MCG/ACT inhaler Inhale 2 puffs into the lungs every 6 (six) hours as needed for wheezing. 1 Inhaler 6  . aspirin 81 MG tablet Take 81 mg by mouth daily.      . calcium elemental as carbonate (PX ANTACID MAXIMUM STRENGTH) 400 MG tablet Chew 400 mg by mouth as needed.    Marland Kitchen DIGOX 125 MCG tablet TAKE ONE-HALF TABLET BY MOUTH DAILY 45 tablet 3  . docusate sodium (STOOL SOFTENER) 100 MG capsule Take 100 mg by mouth 2 (two) times daily.    . finasteride (PROSCAR) 5 MG tablet Take 5 mg by mouth daily.  3  . furosemide (LASIX) 80 MG tablet TAKE 1 AND 1/2 TABLETS BY MOUTH EVERY MORNING AND TAKE ONE TABLET BY MOUTH EVERY EVENING. 75 tablet 2  . gabapentin (NEURONTIN) 600 MG tablet Take 2 tablets by mouth 3 (three) times daily.  6  . lovastatin (MEVACOR) 40 MG tablet Take 40 mg by mouth at bedtime.    . metFORMIN (GLUCOPHAGE) 500 MG tablet Take 500 mg by mouth 2 (two) times daily.     . metoprolol succinate (TOPROL-XL) 25 MG 24 hr tablet Take 0.5 tablets (12.5 mg total) by mouth daily. 30 tablet 1  . Multiple Vitamins-Minerals (CENTRUM PO) Take 1 tablet by  mouth daily.      Marland Kitchen. NITROSTAT 0.4 MG SL tablet Place 0.4 mg under the tongue every 5 (five) minutes as needed.     . potassium chloride SA (K-DUR,KLOR-CON) 20 MEQ tablet TAKE TWO (2) TABLETS BY MOUTH TWICE DAILY. 120 tablet 6  . predniSONE (DELTASONE) 2.5 MG tablet Take 2 tablets (5 mg total) by mouth daily with breakfast. 60 tablet 3  . SYMBICORT 160-4.5 MCG/ACT inhaler INHALE TWO PUFFS BY MOUTH TWICE DAILY 10.2 g 2  . tamsulosin (FLOMAX) 0.4 MG CAPS capsule Take 0.4 mg by mouth daily.    Marland Kitchen. tiotropium (SPIRIVA) 18 MCG inhalation capsule Place 18 mcg into inhaler and inhale daily.    Marland Kitchen. ULORIC 40 MG tablet Take 1 tablet by mouth daily.  6  . warfarin (COUMADIN) 5 MG tablet Take 1 tablet daily except 1/2 tablet on  Mondays and Thursdays (Patient taking differently: Take 1 tablet daily except 1/2 tablet on Tuesday, Friday) 45 tablet 4  . warfarin (COUMADIN) 5 MG tablet TAKE ONE TABLET BY MOUTH DAILY EXCEPT ONE-HALF TABLET ON MONDAY AND THURSDAY. 45 tablet 3   No current facility-administered medications for this visit.    Allergies:  Clopidogrel bisulfate   Social History: The patient  reports that he quit smoking about 17 years ago. His smoking use included Cigarettes. He started smoking about 59 years ago. He has a 96.00 pack-year smoking history. He has never used smokeless tobacco. He reports that he does not drink alcohol or use drugs.   ROS:  Please see the history of present illness. Otherwise, complete review of systems is positive for decreased hearing, chronic dyspnea on exertion, chronic leg edema.  All other systems are reviewed and negative.   Physical Exam: VS:  BP 111/68   Pulse 63   Ht 5\' 9"  (1.753 m)   Wt 191 lb 6.4 oz (86.8 kg)   SpO2 95%   BMI 28.26 kg/m , BMI Body mass index is 28.26 kg/m.  Wt Readings from Last 3 Encounters:  08/22/15 191 lb 6.4 oz (86.8 kg)  08/10/15 198 lb 8 oz (90 kg)  07/03/15 202 lb 9.6 oz (91.9 kg)    General: Chronically ill-appearing male in no distress. Using a rolling walker. HEENT: Conjunctiva and lids normal, oropharynx clear. Neck: Supple, no elevated JVP or carotid bruits, no thyromegaly. Lungs: Decreased breath sounds throughout without active wheezing, nonlabored breathing at rest. Cardiac: Distant, irregularly irregular, no S3, soft systolic murmur, no pericardial rub. Abdomen: Soft, nontender, bowel sounds present, no guarding or rebound. Extremities: Chronic pedal edema and venous stasis, distal pulses 1-2+. Skin: Warm and dry. Musculoskeletal: No kyphosis. Neuropsychiatric: Alert and oriented x3, affect grossly appropriate. Decreased hearing.  ECG: I personally reviewed the tracing from 07/01/2015 which showed atrial fibrillation with  nonspecific ST changes.  Recent Labwork: 07/01/2015: B Natriuretic Peptide 54.0 07/02/2015: ALT 13; AST 21; TSH 1.328 07/04/2015: BUN 23; Creatinine, Ser 1.33; Hemoglobin 12.2; Platelets 210; Potassium 4.0; Sodium 141   Other Studies Reviewed Today:  Echocardiogram 06/19/2011 [facility]): Mild LVH with LVEF 55-60%, mild left atrial enlargement, moderately reduced RV contraction, sclerotic aortic valve, RVSP estimated 45-50 mmHg.  Lexiscan Cardiolite 06/20/2008 [facility]([place]): Slight inferior wall ischemia with LVEF 63%.  Assessment and Plan:  1. Symptomatically stable CAD status post DES to the RCA in 2004 with moderate disease being managed medically. Plan to continue observation at this time unless he develops escalating symptoms.  2. Chronic atrial fibrillation, no active palpitations with adequate heart rate control  on current regimen. He continues on Coumadin with follow-up in the anticoagulation clinic.  3. COPD with chronic hypoxic respiratory failure and cor pulmonale. He remains on standing Lasix for management of chronic leg edema. Uses oxygen at nighttime and follows in the Pulmonary division.  4. Essential hypertension, blood pressure well controlled today.  Current medicines were reviewed with the patient today.  Disposition: Follow-up with me in 6 months.  Signed, Jonelle SidleSamuel G. Breckyn Troyer, MD, [facility] CenterFACC 08/22/2015 3:54 PM    [facility] [address], [place], [place] [address] Phone: [phone]; Fax: [phone]

## 2015-08-22 NOTE — Patient Instructions (Signed)

## 2015-08-27 ENCOUNTER — Ambulatory Visit (INDEPENDENT_AMBULATORY_CARE_PROVIDER_SITE_OTHER): Payer: Medicare Other | Admitting: *Deleted

## 2015-08-27 DIAGNOSIS — Z5181 Encounter for therapeutic drug level monitoring: Secondary | ICD-10-CM

## 2015-08-27 DIAGNOSIS — I482 Chronic atrial fibrillation, unspecified: Secondary | ICD-10-CM

## 2015-08-27 LAB — POCT INR: INR: 2.9

## 2015-09-03 ENCOUNTER — Telehealth: Payer: Self-pay | Admitting: *Deleted

## 2015-09-03 ENCOUNTER — Ambulatory Visit (INDEPENDENT_AMBULATORY_CARE_PROVIDER_SITE_OTHER): Payer: Medicare Other | Admitting: *Deleted

## 2015-09-03 DIAGNOSIS — I482 Chronic atrial fibrillation, unspecified: Secondary | ICD-10-CM

## 2015-09-03 DIAGNOSIS — Z5181 Encounter for therapeutic drug level monitoring: Secondary | ICD-10-CM

## 2015-09-03 LAB — POCT INR: INR: 3.2

## 2015-09-03 NOTE — Telephone Encounter (Signed)
Done.  See coumadin note. 

## 2015-09-03 NOTE — Telephone Encounter (Signed)
INR 3.2 PT 39.0

## 2015-09-07 ENCOUNTER — Other Ambulatory Visit: Payer: Self-pay

## 2015-09-17 ENCOUNTER — Ambulatory Visit (INDEPENDENT_AMBULATORY_CARE_PROVIDER_SITE_OTHER): Payer: Medicare Other | Admitting: Internal Medicine

## 2015-09-17 ENCOUNTER — Telehealth: Payer: Self-pay | Admitting: Cardiology

## 2015-09-17 DIAGNOSIS — I482 Chronic atrial fibrillation, unspecified: Secondary | ICD-10-CM

## 2015-09-17 DIAGNOSIS — Z5181 Encounter for therapeutic drug level monitoring: Secondary | ICD-10-CM

## 2015-09-17 LAB — POCT INR: INR: 2.8

## 2015-09-17 NOTE — Telephone Encounter (Signed)
inr 2.8 Pt 33.5  2.5 M<W<F Whole rest of days

## 2015-09-17 NOTE — Telephone Encounter (Signed)
Addressed - see anticoag encounter from 09/17/15.

## 2015-10-02 ENCOUNTER — Ambulatory Visit (INDEPENDENT_AMBULATORY_CARE_PROVIDER_SITE_OTHER): Payer: Medicare Other | Admitting: *Deleted

## 2015-10-02 DIAGNOSIS — I482 Chronic atrial fibrillation, unspecified: Secondary | ICD-10-CM

## 2015-10-02 DIAGNOSIS — Z5181 Encounter for therapeutic drug level monitoring: Secondary | ICD-10-CM

## 2015-10-02 LAB — POCT INR: INR: 3.3

## 2015-10-09 ENCOUNTER — Ambulatory Visit (INDEPENDENT_AMBULATORY_CARE_PROVIDER_SITE_OTHER): Payer: Medicare Other | Admitting: *Deleted

## 2015-10-09 DIAGNOSIS — I482 Chronic atrial fibrillation, unspecified: Secondary | ICD-10-CM

## 2015-10-09 DIAGNOSIS — Z5181 Encounter for therapeutic drug level monitoring: Secondary | ICD-10-CM

## 2015-10-09 LAB — POCT INR: INR: 1.8

## 2015-10-23 ENCOUNTER — Ambulatory Visit (INDEPENDENT_AMBULATORY_CARE_PROVIDER_SITE_OTHER): Payer: Medicare Other | Admitting: *Deleted

## 2015-10-23 DIAGNOSIS — I482 Chronic atrial fibrillation, unspecified: Secondary | ICD-10-CM

## 2015-10-23 DIAGNOSIS — Z5181 Encounter for therapeutic drug level monitoring: Secondary | ICD-10-CM

## 2015-10-23 LAB — POCT INR: INR: 3.4

## 2015-11-06 ENCOUNTER — Other Ambulatory Visit: Payer: Self-pay | Admitting: Cardiology

## 2015-11-08 ENCOUNTER — Ambulatory Visit (INDEPENDENT_AMBULATORY_CARE_PROVIDER_SITE_OTHER): Payer: Medicare Other | Admitting: *Deleted

## 2015-11-08 DIAGNOSIS — I482 Chronic atrial fibrillation, unspecified: Secondary | ICD-10-CM

## 2015-11-08 DIAGNOSIS — Z5181 Encounter for therapeutic drug level monitoring: Secondary | ICD-10-CM | POA: Diagnosis not present

## 2015-11-08 DIAGNOSIS — Z7901 Long term (current) use of anticoagulants: Secondary | ICD-10-CM

## 2015-11-08 DIAGNOSIS — I4891 Unspecified atrial fibrillation: Secondary | ICD-10-CM

## 2015-11-08 LAB — POCT INR: INR: 2

## 2015-11-20 ENCOUNTER — Ambulatory Visit (INDEPENDENT_AMBULATORY_CARE_PROVIDER_SITE_OTHER): Payer: Medicare Other | Admitting: Pulmonary Disease

## 2015-11-20 ENCOUNTER — Encounter: Payer: Self-pay | Admitting: Pulmonary Disease

## 2015-11-20 VITALS — BP 102/60 | HR 67 | Ht 69.0 in | Wt 189.0 lb

## 2015-11-20 DIAGNOSIS — Z23 Encounter for immunization: Secondary | ICD-10-CM

## 2015-11-20 DIAGNOSIS — I272 Pulmonary hypertension, unspecified: Secondary | ICD-10-CM | POA: Diagnosis not present

## 2015-11-20 DIAGNOSIS — J9611 Chronic respiratory failure with hypoxia: Secondary | ICD-10-CM

## 2015-11-20 DIAGNOSIS — I2510 Atherosclerotic heart disease of native coronary artery without angina pectoris: Secondary | ICD-10-CM

## 2015-11-20 DIAGNOSIS — J449 Chronic obstructive pulmonary disease, unspecified: Secondary | ICD-10-CM | POA: Diagnosis not present

## 2015-11-20 DIAGNOSIS — Z7952 Long term (current) use of systemic steroids: Secondary | ICD-10-CM | POA: Diagnosis not present

## 2015-11-20 NOTE — Patient Instructions (Signed)
   Call me when you are a week from finishing your current bottle of Prednisone because we will be adjusting your prescription to try to get you off & taper off of the Prednisone.  Your Prednisone prescription after you finish this one will be for Prednisone 1mg  tablets - you will take 4 tablets daily for 4 weeks, then 3 tablets daily for 4 weeks, then 2 tablets daily for 4 weeks then 1 tablet daily for 4 weeks.  We will be having you do blood work at your doctor's office as well as blood pressure checks intermittently when we start to wean you off the Prednisone.  If you start to feel poorly, have low blood pressure, etc as we wean down your Prednisone go back to taking 5 tablets (5mg ) of prednisone daily and call my office to get a new prescription & instructions.  I will see you back in 6 months or sooner if needed. Call me with any questions or concerns.

## 2015-11-20 NOTE — Progress Notes (Signed)
Subjective:    Patient ID: [name], male    DOB: 01/08/1940, 75 y.o.   MRN: [id number]  C.C.:  Follow-up for Severe COPD, Pulmonary Hypertension, Chronic Hypoxic Respiratory Failure, & Chronic Steroid Therapy.  HPI  Severe COPD: Prescribed Spiriva & Symbicort. No exacerbations since last appointment. Reports compliance with his inhalers. Hasn't needed his rescue inhaler or nebulizer medication in some time. He reports baseline dyspnea. Coughing intermittently & productive of a white mucus. He does wheeze occasionally.   Pulmonary Hypertension: Multifactorial from known diastolic congestive heart failure as well as COPD. Chronic anticoagulation with Coumadin. Baseline weight at 198#. Weight today 189#. Continuing to use Lasix scheduled daily.   Chronic Hypoxic Respiratory Failure: Prescribed oxygen at 3 L/m during the day along with 2 L/m at night while sleeping. Still not using his oxygen routinely during the day but is sleeping religiously with it at night.   Chronic Steroid Therapy:  Prednisone therapy since 2011. Dose of prednisone decreased to 5 mg daily at last appointment.  Review of Systems  No chest pain or pressure. No fever, chills, or sweats. No sinus congestion, pressure, or pain.   Allergies  Allergen Reactions  . Clopidogrel Bisulfate     REACTION: rash    Current Outpatient Prescriptions on File Prior to Visit  Medication Sig Dispense Refill  . albuterol (PROVENTIL) (2.5 MG/3ML) 0.083% nebulizer solution Take 3 mLs (2.5 mg total) by nebulization every 6 (six) hours as needed for wheezing. 75 mL 11  . albuterol (VENTOLIN HFA) 108 (90 Base) MCG/ACT inhaler Inhale 2 puffs into the lungs every 6 (six) hours as needed for wheezing. 1 Inhaler 6  . aspirin 81 MG tablet Take 81 mg by mouth daily.      . calcium elemental as carbonate (PX ANTACID MAXIMUM STRENGTH) 400 MG tablet Chew 400 mg by mouth as needed.    Marland Kitchen. DIGOX 125 MCG tablet TAKE ONE-HALF TABLET BY MOUTH DAILY 45  tablet 3  . docusate sodium (STOOL SOFTENER) 100 MG capsule Take 100 mg by mouth 2 (two) times daily.    . finasteride (PROSCAR) 5 MG tablet Take 5 mg by mouth daily.  3  . furosemide (LASIX) 80 MG tablet TAKE 1 AND 1/2 TABLETS BY MOUTH EVERY MORNING AND TAKE ONE TABLET BY MOUTH EVERY EVENING. 75 tablet 2  . gabapentin (NEURONTIN) 600 MG tablet Take 2 tablets by mouth 3 (three) times daily.  6  . lovastatin (MEVACOR) 40 MG tablet Take 40 mg by mouth at bedtime.    . metFORMIN (GLUCOPHAGE) 500 MG tablet Take 500 mg by mouth 2 (two) times daily.     . metoprolol succinate (TOPROL-XL) 25 MG 24 hr tablet Take 0.5 tablets (12.5 mg total) by mouth daily. 30 tablet 1  . Multiple Vitamins-Minerals (CENTRUM PO) Take 1 tablet by mouth daily.      Marland Kitchen. NITROSTAT 0.4 MG SL tablet Place 0.4 mg under the tongue every 5 (five) minutes as needed.     . potassium chloride SA (K-DUR,KLOR-CON) 20 MEQ tablet TAKE TWO (2) TABLETS BY MOUTH TWICE DAILY. 180 tablet 3  . predniSONE (DELTASONE) 2.5 MG tablet Take 2 tablets (5 mg total) by mouth daily with breakfast. 60 tablet 3  . SYMBICORT 160-4.5 MCG/ACT inhaler INHALE TWO PUFFS BY MOUTH TWICE DAILY 10.2 g 2  . tamsulosin (FLOMAX) 0.4 MG CAPS capsule Take 0.4 mg by mouth daily.    Marland Kitchen. tiotropium (SPIRIVA) 18 MCG inhalation capsule Place 18 mcg into inhaler  and inhale daily.    Marland Kitchen. ULORIC 40 MG tablet Take 1 tablet by mouth daily.  6  . warfarin (COUMADIN) 5 MG tablet Take 1 tablet daily except 1/2 tablet on Mondays and Thursdays (Patient taking differently: Take 1 tablet daily except 1/2 tablet on Tuesday, Friday) 45 tablet 4  . warfarin (COUMADIN) 5 MG tablet TAKE ONE TABLET BY MOUTH DAILY EXCEPT ONE-HALF TABLET ON MONDAY AND THURSDAY. 45 tablet 3   No current facility-administered medications on file prior to visit.     Past Medical History:  Diagnosis Date  . CAD (coronary artery disease)    DES to RCA 2004, moderate residual LAD diisease  . Chronic atrial  fibrillation (HCC)   . Chronic diastolic CHF (congestive heart failure) (HCC)   . COPD with emphysema (HCC)    Followed by Pulmonary  . Drug allergy    Plavix causes severe rash  . Hyperlipemia   . Hypertension   . Obesity   . Peripheral arterial disease (HCC)   . Right ventricular dysfunction   . Secondary pulmonary hypertension    Moderate in setting of COPD  . Type 2 diabetes mellitus with diabetic neuropathy [facility](HCC)     Past Surgical History:  Procedure Laterality Date  . CATARACT EXTRACTION Bilateral   . CORONARY STENT PLACEMENT  2004  . KNEE SURGERY    . ROTATOR CUFF REPAIR     x 3    Family History  Problem Relation Age of Onset  . Heart disease Mother   . Stroke Mother   . Throat cancer Father   . Lung disease Neg Hx     Social History   Social History  . Marital status: Married    Spouse name: N/A  . Number of children: 3  . Years of education: hs   Occupational History  . Dance movement psychotherapisthighway worker Retired   Social History Main Topics  . Smoking status: Former Smoker    Packs/day: 2.00    Years: 48.00    Types: Cigarettes    Start date: 02/14/1956    Quit date: 01/06/1998  . Smokeless tobacco: Never Used  . Alcohol use No     Comment: Remote history of use (beer)  . Drug use: No  . Sexual activity: Not Asked   Other Topics Concern  . None   Social History Narrative   Originally from [place]. 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 No international travel. Previously worked for [employer] in [place]. Exposed to hot asphalt. Has a dog & cat. No bird, hot tub or mold exposure. Previously was playing golf before his joint pain became significant.       Objective:   Physical Exam BP 102/60 (BP Location: Left Arm, Cuff Size: Normal)   Pulse 67   Ht 5\' 9"  (1.753 m)   Wt 189 lb (85.7 kg)   SpO2 95%   BMI 27.91 kg/m  General:  Awake. Wife accompanying the patient today. Comfortable. Patient in wheelchair. Integument:  Warm & dry. No rash on  exposed skin.  HEENT:  Dry. mucus membranes. No oral ulcers. No scleral icterus. Mild right nasal turbinate swelling.  Cardiovascular:  Regular rate. Mild lower extremity edema unchanged. Normal S1 & S2. Pulmonary: Overall clear with auscultation. Normal work of breathing on room air. Speaking in complete sentences. Abdomen: Soft. Normal bowel sounds. Nondistended.  Musculoskeletal:  Normal bulk and tone. Patient does have bilateral PIP & DIP joint deformities with  tophi.  PFT 05/10/15: FVC 2.84 L (74%) FEV1 1.29 L (47%) FEV1/FVC 0.45 FEF 25-75 0.52 (26%) negative bronchodilator response                                                                 DLCO uncorrected 45% (unable to perform lung volumes) 08/24/06:FVC 3.15 L (71%) FEV1 1.29 L (43%) FEV1/FVC 0.41 FEF 25-75 0.34 L (12%) positive bronchodilator response TLC 6.79 L (104%) RV 145% ERV 35% DLCO uncorrected 56%  IMAGING CXR PA/LAT 11/09/14 (previously reviewed by me): No focal opacity or effusion appreciated. Mild hyperinflation with flattening of the diaphragms. Heart normal in size & mediastinum normal in contour.   CARDIAC TTE (06/19/11): LV normal in size with mild concentric LVH. EF 55-60%. LA & RA mildly dilated. RV not well visualized. Mild RV dilation with moderately reduced systolic function. RVSP 49 mmHg. No aortic stenosis or regurgitation. No mitral stenosis or regurgitation. No tricuspid regurgitation. No pulmonic regurgitation. No pericardial effusion. Main pulmonary artery not well visualized.  LABS 01/23/15 Alpha-1 antitrypsin: MM (154)  10/25/13 ANA: Negative RF: 10.9    Assessment & Plan:  75 y.o. caucasian male with long-standing prior history of tobacco use with underlying severe COPD and chronic hypoxic respiratory failure. Patient still is not consistently using his oxygen therapy. Symptomatically his COPD seems to be very well-controlled at this time and I feel warrants no further inhaled medication  adjustments. His weight is continuing to improve and overall his volume status seems to be improving as well. I had a lengthy discussion today regarding the patient's chronic prednisone therapy and the potential for intolerance of further steroid weaning. We discussed the possible side effect of adrenal insufficiency with chronic prednisone use as well as risk factors including avascular necrosis, cataracts, fluid retention, weight gain, insulin resistance, etc. At this time we are proceeding with cautiously tapering the patient's steroid regimen. I instructed him to contact my office if he had any questions or concerns as we move forward.  1. Severe COPD:  Symptomatically well controlled with Symbicort and Spiriva. No changes at this time. 2. Pulmonary Hypertension:  Volume status is continuing to improve. Continuing on Lasix. Following with cardiology. No need for pulmonary arterial vasodilator therapy at this time. 3. Chronic Hypoxic Respiratory Failure: Recommended he continue on oxygen as previously prescribed at 3 L/m with exertion and 2 L/m while sleeping. 4. Chronic Prednisone Therapy:  Patient completing his current prescription of prednisone 5 mg daily. He will contact my office for a new prescription/prednisone taper which will taper by 1 mg every one month. He will need intermittent electrolyte panels and local blood pressure checks and his primary care physician's office moving forward and we will arrange this once he is ready to begin his taper. 5. Health Maintenance:  S/P Prevnar 11/11/12 & Pneumovax 10/28/07. Administering high-dose influenza vaccine today. 6. Follow-up: Patient to return to clinic in 6 months or sooner if needed.  Donna Christen Jamison Neighbor, M.D. [facility] Pager:  [phone] After 3pm or if no response, call [phone] 4:40 PM 11/20/15

## 2015-12-03 ENCOUNTER — Other Ambulatory Visit: Payer: Self-pay | Admitting: *Deleted

## 2015-12-03 ENCOUNTER — Other Ambulatory Visit: Payer: Self-pay

## 2015-12-03 MED ORDER — DIGOXIN 125 MCG PO TABS: 62.5000 ug | ORAL_TABLET | Freq: Every day | ORAL | 3 refills | Status: DC

## 2015-12-03 MED ORDER — FUROSEMIDE 80 MG PO TABS: ORAL_TABLET | ORAL | 3 refills | Status: DC

## 2015-12-03 MED ORDER — WARFARIN SODIUM 5 MG PO TABS: ORAL_TABLET | ORAL | 0 refills | Status: DC

## 2015-12-04 ENCOUNTER — Telehealth: Payer: Self-pay | Admitting: Pulmonary Disease

## 2015-12-04 ENCOUNTER — Ambulatory Visit (INDEPENDENT_AMBULATORY_CARE_PROVIDER_SITE_OTHER): Payer: Medicare Other | Admitting: *Deleted

## 2015-12-04 DIAGNOSIS — Z7901 Long term (current) use of anticoagulants: Secondary | ICD-10-CM

## 2015-12-04 DIAGNOSIS — I482 Chronic atrial fibrillation, unspecified: Secondary | ICD-10-CM

## 2015-12-04 DIAGNOSIS — I4891 Unspecified atrial fibrillation: Secondary | ICD-10-CM | POA: Diagnosis not present

## 2015-12-04 DIAGNOSIS — I2510 Atherosclerotic heart disease of native coronary artery without angina pectoris: Secondary | ICD-10-CM

## 2015-12-04 DIAGNOSIS — Z5181 Encounter for therapeutic drug level monitoring: Secondary | ICD-10-CM | POA: Diagnosis not present

## 2015-12-04 LAB — POCT INR: INR: 2.4

## 2015-12-04 MED ORDER — PREDNISONE 2.5 MG PO TABS: 5.0000 mg | ORAL_TABLET | Freq: Every day | ORAL | 1 refills | Status: DC

## 2015-12-04 NOTE — Telephone Encounter (Signed)
Called to speak to pt. He states that he did not call our office in regards to this refill. While on the phone, pt did state that did need this prescription refilled. Rx has been sent in. Nothing further was needed.

## 2015-12-05 ENCOUNTER — Other Ambulatory Visit: Payer: Self-pay | Admitting: Cardiology

## 2015-12-05 MED ORDER — POTASSIUM CHLORIDE CRYS ER 20 MEQ PO TBCR: EXTENDED_RELEASE_TABLET | ORAL | 3 refills | Status: DC

## 2015-12-05 NOTE — Telephone Encounter (Signed)
Medication sent to pharmacy  

## 2015-12-05 NOTE — Telephone Encounter (Signed)
potassium chloride SA (K-DUR,KLOR-CON) 20 MEQ tablet   [facility]

## 2015-12-27 IMAGING — CR DG CHEST 2V
2 series · 2 of 2 positions shown · non-contrast
Comparison: 08/08/1978 [DATE]

CLINICAL DATA: Pulmonary emphysema

EXAM:
CHEST  2 VIEW

[view not recorded (1 of 2)]
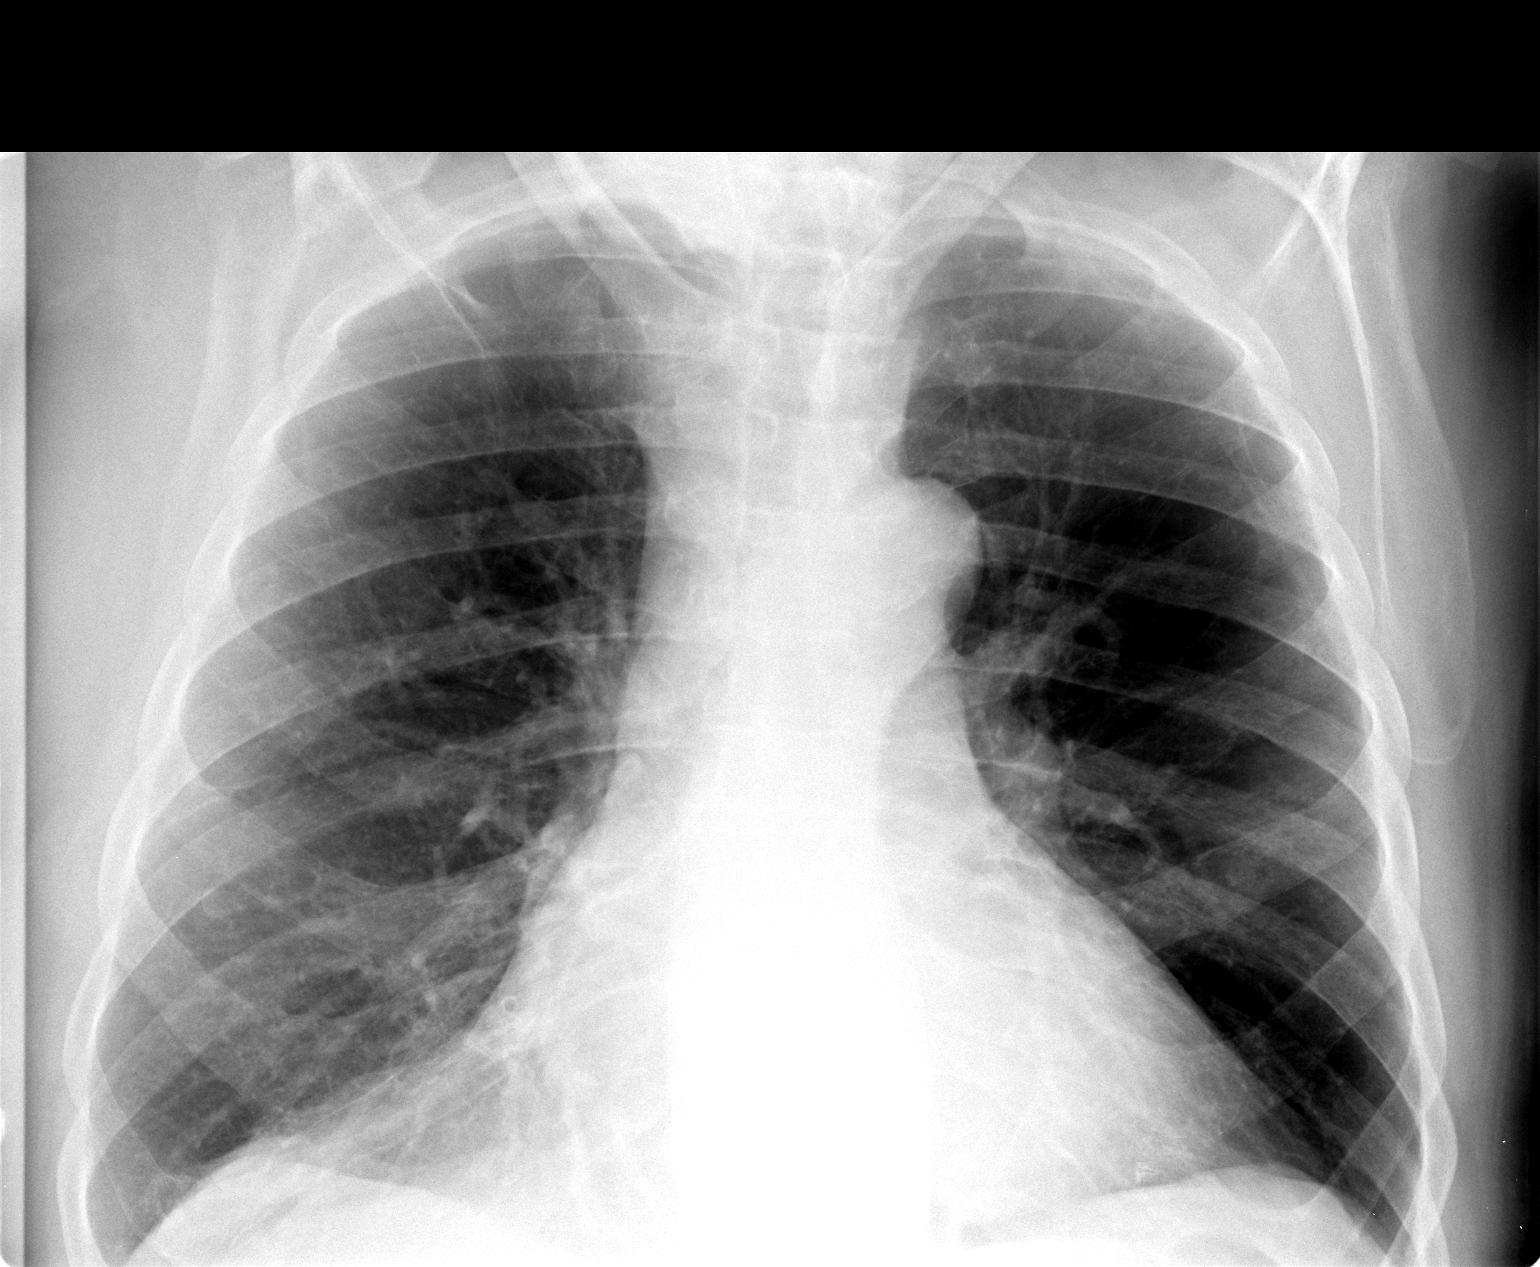

[view not recorded (2 of 2)]
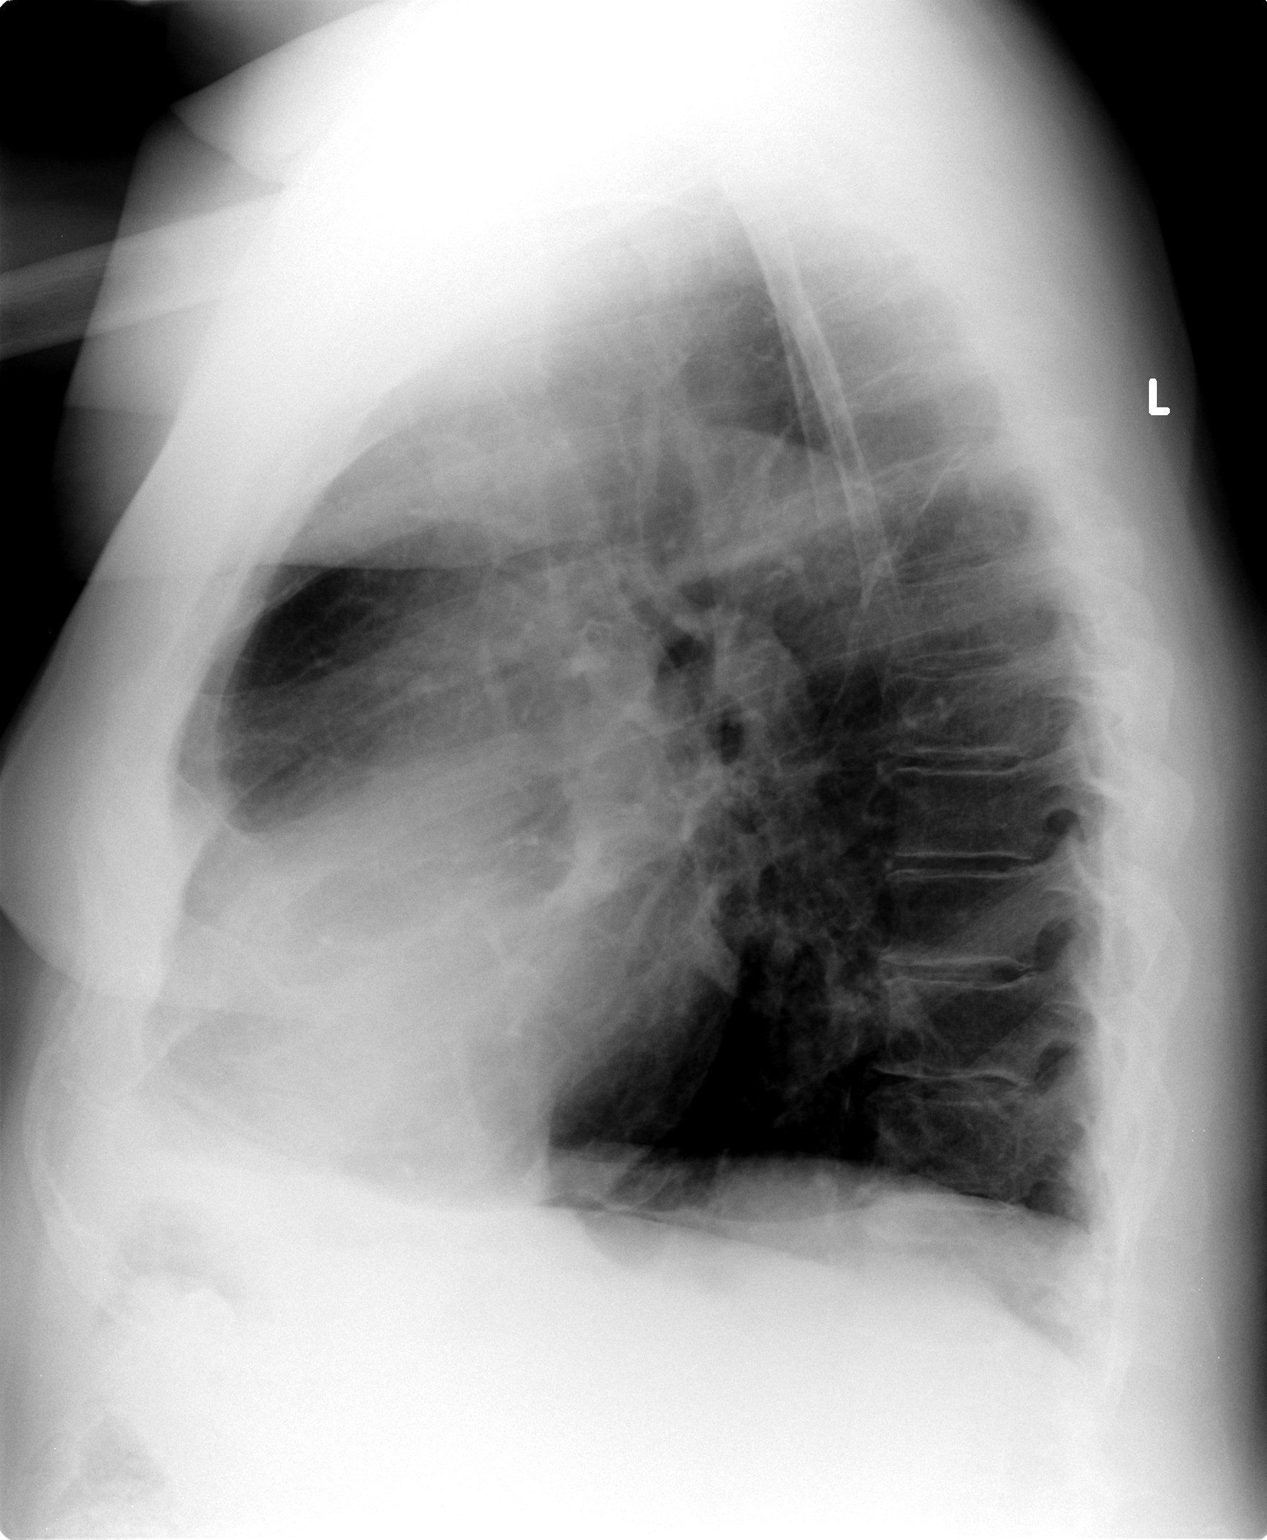

[2 of 2 positions shown; findings below may reference images not displayed]

FINDINGS: Heart size upper normal.  Negative for heart failure

The lungs are hyperinflated consistent with COPD. Negative for
pneumonia or effusion. Negative for mass or adenopathy.
IMPRESSION: COPD without acute abnormality.  No change from the prior study.

## 2016-01-03 ENCOUNTER — Ambulatory Visit (INDEPENDENT_AMBULATORY_CARE_PROVIDER_SITE_OTHER): Payer: Medicare Other | Admitting: *Deleted

## 2016-01-03 DIAGNOSIS — I2510 Atherosclerotic heart disease of native coronary artery without angina pectoris: Secondary | ICD-10-CM | POA: Diagnosis not present

## 2016-01-03 DIAGNOSIS — I482 Chronic atrial fibrillation, unspecified: Secondary | ICD-10-CM

## 2016-01-03 DIAGNOSIS — Z7901 Long term (current) use of anticoagulants: Secondary | ICD-10-CM | POA: Diagnosis not present

## 2016-01-03 DIAGNOSIS — I4891 Unspecified atrial fibrillation: Secondary | ICD-10-CM

## 2016-01-03 DIAGNOSIS — Z5181 Encounter for therapeutic drug level monitoring: Secondary | ICD-10-CM

## 2016-01-03 LAB — POCT INR: INR: 2.6

## 2016-01-14 ENCOUNTER — Telehealth: Payer: Self-pay | Admitting: Pulmonary Disease

## 2016-01-14 DIAGNOSIS — Z5181 Encounter for therapeutic drug level monitoring: Secondary | ICD-10-CM

## 2016-01-14 NOTE — Telephone Encounter (Signed)
Pt is currently on prednisone 2.5mg - 2 tabs daily.  At last OV, JN had asked pt to call office when he was running low on his current rx so we can begin to titrate down.  Pt is wishing to start this.  [facility]    Last AVS:  Call me when you are a week from finishing your current bottle of Prednisone because we will be adjusting your prescription to try to get you off & taper off of the Prednisone. Your Prednisone prescription after you finish this one will be for Prednisone 1mg  tablets - you will take 4 tablets daily for 4 weeks, then 3 tablets daily for 4 weeks, then 2 tablets daily for 4 weeks then 1 tablet daily for 4 weeks. We will be having you do blood work at your doctor's office as well as blood pressure checks intermittently when we start to wean you off the Prednisone. If you start to feel poorly, have low blood pressure, etc as we wean down your Prednisone go back to taking 5 tablets (5mg ) of prednisone daily and call my office to get a new prescription & instructions. I will see you back in 6 months or sooner if needed. Call me with any questions or concerns  JN please advise.  Thanks!

## 2016-01-15 MED ORDER — PREDNISONE 1 MG PO TABS: ORAL_TABLET | ORAL | 0 refills | Status: DC

## 2016-01-15 NOTE — Telephone Encounter (Signed)
Dr Jamison NeighborNestor, please advise which diagnosis we need to use for the BMP. Any specific or just the severe COPD?  Lab is pending and will need to be signed. Thanks.

## 2016-01-15 NOTE — Telephone Encounter (Signed)
Go ahead and put in the new Prednisone Rx order with sufficient quantity of tablets and refills:  Prednisone 1mg  tablet - 4 tablets daily x4 weeks, then 3 tablets daily x4 weeks, then 2 tablets daily x4 weeks, then 1 tablet daily until he sees me back in office  He should have an appointment 6 months from his last office visit in November... May Please order a BMP now, then another in 4 weeks, then another in 8 weeks, then another in 12 weeks - This can be done at his PCP's office or wherever is convenient for him. Make sure he is getting his blood pressure checked by his PCP or a reliable source. The remainder of my instructions stand as per the AVS.  Thanks.

## 2016-01-15 NOTE — Telephone Encounter (Signed)
Pt aware of rec's per Dr Jamison NeighborNestor.  [facility] per patient request. Pt did not want this through his local pharmacy.  Pt aware of lab order placed and that he will need to get this lab repeated every 4 weeks for the next 12 weeks. Pt states that he is unsure that his PCP has a lab in their office and that he will just come here to have these drawn.  There is a recall in the system for the follow up appt in May 2018 - this will be scheduled closer to the date as the schedule is opened. Pt is also aware that he needs to have his BP checked routinely at either his PCP or a reliable source. Pt expressed understanding to all instructions. Pt repeated instructions back as he wrote them all down.

## 2016-01-16 NOTE — Telephone Encounter (Signed)
Chronic Prednisone/Steroid Use or COPD. Whichever works.

## 2016-01-16 NOTE — Telephone Encounter (Signed)
Order signed. Nothing further needed.

## 2016-01-17 ENCOUNTER — Telehealth: Payer: Self-pay | Admitting: Pulmonary Disease

## 2016-01-17 ENCOUNTER — Other Ambulatory Visit (INDEPENDENT_AMBULATORY_CARE_PROVIDER_SITE_OTHER): Payer: Medicare Other

## 2016-01-17 DIAGNOSIS — Z5181 Encounter for therapeutic drug level monitoring: Secondary | ICD-10-CM

## 2016-01-17 DIAGNOSIS — I272 Pulmonary hypertension, unspecified: Secondary | ICD-10-CM

## 2016-01-17 LAB — BASIC METABOLIC PANEL
BUN: 20 mg/dL (ref 6–23)
CALCIUM: 9.1 mg/dL (ref 8.4–10.5)
CO2: 32 mEq/L (ref 19–32)
CREATININE: 1.5 mg/dL (ref 0.40–1.50)
Chloride: 100 mEq/L (ref 96–112)
GFR: 48.12 mL/min — AB (ref >60.00)
GLUCOSE: 106 mg/dL — AB (ref 70–99)
Potassium: 4.3 mEq/L (ref 3.5–5.1)
SODIUM: 142 meq/L (ref 135–145)

## 2016-01-17 NOTE — Telephone Encounter (Signed)
LMTCB

## 2016-01-18 NOTE — Telephone Encounter (Signed)
PCP's office is ok with me. Please refer to telephone encounter on 1/9 by me. Thanks.

## 2016-01-18 NOTE — Telephone Encounter (Signed)
Spoke with pt, wishes to get future standing labs drawn at his PCP office as it is closer to his home.  Pt had BMP drawn yesterday in our lab downstairs as he had not heard from our office regarding this.  JN please advise if ok to order labs to PCP office for pt ease- if so please specify labs needing to be ordered and frequency.  Thanks!

## 2016-01-18 NOTE — Telephone Encounter (Signed)
Patient stated he came in yesterday 01/17/2016 to have labs done.

## 2016-01-18 NOTE — Telephone Encounter (Signed)
Message from 01/15/16 encounter: Please order a BMP now, then another in 4 weeks, then another in 8 weeks, then another in 12 weeks - This can be done at his PCP's office or wherever is convenient for him. ------------------------------------ lmtcb x1 for pt.

## 2016-01-22 NOTE — Telephone Encounter (Signed)
lmtcb x2 for pt. 

## 2016-01-25 NOTE — Telephone Encounter (Signed)
Attempted to contact pt. He answered the line then it went dead. Tried to call him back but received a busy signal. Will try back.

## 2016-01-25 NOTE — Telephone Encounter (Signed)
[phone] pt calling back

## 2016-01-25 NOTE — Telephone Encounter (Signed)
Spoke with pt. He is aware that JN is fine with him having his labs drawn at his PCP. Orders will be faxed to pt's PCP. Nothing further was needed.

## 2016-01-30 ENCOUNTER — Telehealth: Payer: Self-pay | Admitting: Pulmonary Disease

## 2016-01-30 NOTE — Telephone Encounter (Signed)
Spoke with the pt  He states that his PCP did not receive lab order (bmet) yet  I called Dr Burdine's office and verified the fax, and order was printed and refaxed to Dr Leandrew KoyanagiBurdine at [phone] Pt aware and nothing further needed

## 2016-02-07 ENCOUNTER — Ambulatory Visit (INDEPENDENT_AMBULATORY_CARE_PROVIDER_SITE_OTHER): Payer: Medicare Other | Admitting: *Deleted

## 2016-02-07 DIAGNOSIS — Z7901 Long term (current) use of anticoagulants: Secondary | ICD-10-CM | POA: Diagnosis not present

## 2016-02-07 DIAGNOSIS — I4891 Unspecified atrial fibrillation: Secondary | ICD-10-CM | POA: Diagnosis not present

## 2016-02-07 DIAGNOSIS — I482 Chronic atrial fibrillation, unspecified: Secondary | ICD-10-CM

## 2016-02-07 DIAGNOSIS — Z5181 Encounter for therapeutic drug level monitoring: Secondary | ICD-10-CM | POA: Diagnosis not present

## 2016-02-07 LAB — POCT INR: INR: 2.6

## 2016-02-18 ENCOUNTER — Telehealth: Payer: Self-pay | Admitting: Pulmonary Disease

## 2016-02-18 NOTE — Telephone Encounter (Signed)
Spoke with pt. States that he is having to switch DMEs. He is currently using [facility] and is having to switch to [facility]. States that [facility] sent some information to [facility] but they needing more information from us before they can fill his oxygen order. Called [facility] and was placed on an extremely long hold. Will try back.

## 2016-02-19 ENCOUNTER — Ambulatory Visit (INDEPENDENT_AMBULATORY_CARE_PROVIDER_SITE_OTHER): Payer: Medicare Other | Admitting: Adult Health

## 2016-02-19 ENCOUNTER — Encounter: Payer: Self-pay | Admitting: Adult Health

## 2016-02-19 DIAGNOSIS — J449 Chronic obstructive pulmonary disease, unspecified: Secondary | ICD-10-CM | POA: Diagnosis not present

## 2016-02-19 DIAGNOSIS — J9611 Chronic respiratory failure with hypoxia: Secondary | ICD-10-CM | POA: Diagnosis not present

## 2016-02-19 NOTE — Progress Notes (Signed)
@Patient  ID: [name], male    DOB: [birth year], 76 y.o.   MRN: [id number]  Chief Complaint  Patient presents with  . Follow-up    COPD     Referring provider: Juliette Alcide, MD  HPI: 76 yo male former smoker followed for severe COPD, O2 RF , Pulmonary HTN   TEST  PFT 05/10/15: FVC 2.84 L (74%) FEV1 1.29 L (47%) FEV1/FVC 0.45 FEF 25-75 0.52 (26%) negative bronchodilator response                                                                 DLCO uncorrected 45% (unable to perform lung volumes) 08/24/06:FVC 3.15 L (71%) FEV1 1.29 L (43%) FEV1/FVC 0.41 FEF 25-75 0.34 L (12%) positive bronchodilator response TLC 6.79 L (104%) RV 145% ERV 35% DLCO uncorrected 56%  IMAGING CXR PA/LAT 11/09/14 (previously reviewed by me): No focal opacity or effusion appreciated. Mild hyperinflation with flattening of the diaphragms. Heart normal in size & mediastinum normal in contour.   CARDIAC TTE (06/19/11): LV normal in size with mild concentric LVH. EF 55-60%. LA & RA mildly dilated. RV not well visualized. Mild RV dilation with moderately reduced systolic function. RVSP 49 mmHg. No aortic stenosis or regurgitation. No mitral stenosis or regurgitation. No tricuspid regurgitation. No pulmonic regurgitation. No pericardial effusion. Main pulmonary artery not well visualized.  LABS 01/23/15 Alpha-1 antitrypsin: MM (154)  10/25/13 ANA: Negative RF: 10.9   02/19/2016 Follow up ; COPD /O2 RF  Patient presents for a three-month follow-up Underlying severe COPD and is on Spiriva and Symbicort. He denies any flare of his shortness of breath or cough. He is maintained on low-dose prednisone , he is on taper , currently 4mg  daily .   He is on oxygen at 2 L. Needs a Careers adviser due to coverage change. Today. Oxygen was 86% on room air. Placed on 2 L of oxygen with O2 saturation at 97%. He does feel benefit from oxygen.   Allergies  Allergen Reactions  . Clopidogrel  Bisulfate     REACTION: rash    Immunization History  Administered Date(s) Administered  . H1N1 12/20/2007  . Influenza Split 10/15/2010, 10/13/2011, 10/06/2012  . Influenza Whole 10/28/2007, 10/06/2008, 09/07/2009  . Influenza, High Dose Seasonal PF 11/20/2015  . Influenza,inj,Quad PF,36+ Mos 10/31/2014  . Influenza-Unspecified 10/06/2013  . Pneumococcal Conjugate-13 11/11/2012  . Pneumococcal Polysaccharide-23 10/28/2007  . Zoster 11/16/2010    Past Medical History:  Diagnosis Date  . CAD (coronary artery disease)    DES to RCA 2004, moderate residual LAD diisease  . Chronic atrial fibrillation (HCC)   . Chronic diastolic CHF (congestive heart failure) (HCC)   . COPD with emphysema (HCC)    Followed by Pulmonary  . Drug allergy    Plavix causes severe rash  . Hyperlipemia   . Hypertension   . Obesity   . Peripheral arterial disease (HCC)   . Right ventricular dysfunction   . Secondary pulmonary hypertension    Moderate in setting of COPD  . Type 2 diabetes mellitus with diabetic neuropathy (HCC)     Tobacco History: History  Smoking Status  . Former Smoker  . Packs/day: 2.00  . Years: 48.00  . Types: Cigarettes  . Start date: 02/14/1956  .  Quit date: 01/06/1998  Smokeless Tobacco  . Never Used   Counseling given: Not Answered   Outpatient Encounter Prescriptions as of 02/19/2016  Medication Sig  . albuterol (PROVENTIL) (2.5 MG/3ML) 0.083% nebulizer solution Take 3 mLs (2.5 mg total) by nebulization every 6 (six) hours as needed for wheezing.  Marland Kitchen. albuterol (VENTOLIN HFA) 108 (90 Base) MCG/ACT inhaler Inhale 2 puffs into the lungs every 6 (six) hours as needed for wheezing.  Marland Kitchen. aspirin 81 MG tablet Take 81 mg by mouth daily.    . calcium elemental as carbonate (PX ANTACID MAXIMUM STRENGTH) 400 MG tablet Chew 400 mg by mouth as needed.  . digoxin (DIGOX) 0.125 MG tablet Take 0.5 tablets (62.5 mcg total) by mouth daily.  Marland Kitchen. docusate sodium (STOOL SOFTENER) 100 MG  capsule Take 100 mg by mouth 2 (two) times daily.  . finasteride (PROSCAR) 5 MG tablet Take 5 mg by mouth daily.  . furosemide (LASIX) 80 MG tablet TAKE 1 AND 1/2 TABLETS BY MOUTH EVERY MORNING AND TAKE ONE TABLET BY MOUTH EVERY EVENING.  Marland Kitchen. gabapentin (NEURONTIN) 600 MG tablet Take 2 tablets by mouth 3 (three) times daily.  Marland Kitchen. lovastatin (MEVACOR) 40 MG tablet Take 40 mg by mouth at bedtime.  . metFORMIN (GLUCOPHAGE) 500 MG tablet Take 500 mg by mouth 2 (two) times daily.   . metoprolol succinate (TOPROL-XL) 25 MG 24 hr tablet Take 0.5 tablets (12.5 mg total) by mouth daily.  . Multiple Vitamins-Minerals (CENTRUM PO) Take 1 tablet by mouth daily.    Marland Kitchen. NITROSTAT 0.4 MG SL tablet Place 0.4 mg under the tongue every 5 (five) minutes as needed.   . potassium chloride SA (K-DUR,KLOR-CON) 20 MEQ tablet TAKE TWO (2) TABLETS BY MOUTH TWICE DAILY.  Marland Kitchen. predniSONE (DELTASONE) 2.5 MG tablet Take 2 tablets (5 mg total) by mouth daily with breakfast.  . SYMBICORT 160-4.5 MCG/ACT inhaler INHALE TWO PUFFS BY MOUTH TWICE DAILY  . tamsulosin (FLOMAX) 0.4 MG CAPS capsule Take 0.4 mg by mouth daily.  Marland Kitchen. tiotropium (SPIRIVA) 18 MCG inhalation capsule Place 18 mcg into inhaler and inhale daily.  Marland Kitchen. ULORIC 40 MG tablet Take 1 tablet by mouth daily.  Marland Kitchen. warfarin (COUMADIN) 5 MG tablet Take 1 tablet daily except 1/2 tablet on Monday, Wednesday, Friday or as directed by coumadin clinic  . [DISCONTINUED] predniSONE (DELTASONE) 1 MG tablet Take 4 tablets daily x 4 weeks, then 3 tabs x 4 weeks, then 2 tabs x 4 weeks and then 1 tablet daily until seen in office. (Patient not taking: Reported on 02/19/2016)   No facility-administered encounter medications on file as of 02/19/2016.      Review of Systems  Constitutional:   No  weight loss, night sweats,  Fevers, chills, fatigue, or  lassitude.  HEENT:   No headaches,  Difficulty swallowing,  Tooth/dental problems, or  Sore throat,                No sneezing, itching, ear ache,  nasal congestion, post nasal drip,   CV:  No chest pain,  Orthopnea, PND, swelling in lower extremities, anasarca, dizziness, palpitations, syncope.   GI  No heartburn, indigestion, abdominal pain, nausea, vomiting, diarrhea, change in bowel habits, loss of appetite, bloody stools.   Resp: No shortness of breath with exertion or at rest.  No excess mucus, no productive cough,  No non-productive cough,  No coughing up of blood.  No change in color of mucus.  No wheezing.  No chest wall deformity  Skin: no rash or lesions.  GU: no dysuria, change in color of urine, no urgency or frequency.  No flank pain, no hematuria   MS:  No joint pain or swelling.  No decreased range of motion.  No back pain.    Physical Exam  BP 132/74 (BP Location: Left Arm, Cuff Size: Normal)   Pulse 77   Ht 5\' 9"  (1.753 m)   Wt 183 lb 9.6 oz (83.3 kg)   SpO2 92%   BMI 27.11 kg/m   GEN: A/Ox3; pleasant , NAD, elderly ,chronically ill appearing on o2 , walks with walker    HEENT:  [place]/AT,  EACs-clear, TMs-wnl, NOSE-clear, THROAT-clear, no lesions, no postnasal drip or exudate noted.   NECK:  Supple w/ fair ROM; no JVD; normal carotid impulses w/o bruits; no thyromegaly or nodules palpated; no lymphadenopathy.    RESP  Decreased BS In bases . no accessory muscle use, no dullness to percussion  CARD:  RRR, no m/r/g, no peripheral edema, pulses intact, no cyanosis or clubbing.  GI:   Soft & nt; nml bowel sounds; no organomegaly or masses detected.   Musco: Warm bil, no deformities or joint swelling noted. , leg braces.   Neuro: alert, no focal deficits noted.    Skin: Warm, no lesions or rashes  Lab Results:  CBC  Assessment & Plan:   No problem-specific Assessment & Plan notes found for this encounter.     Rubye Oaks, NP 02/19/2016

## 2016-02-19 NOTE — Patient Instructions (Signed)
Continue on current regimen  Follow up with Dr. Jamison NeighborNestor as planned  And As needed

## 2016-02-19 NOTE — Assessment & Plan Note (Signed)
Continue on Oxygen   

## 2016-02-19 NOTE — Assessment & Plan Note (Signed)
Compensated on present regimen .   Plan  Patient Instructions  Continue on current regimen  Follow up with Dr. Jamison NeighborNestor as planned  And As needed

## 2016-02-19 NOTE — Telephone Encounter (Signed)
Per Be[facility] with [facility], pt will need a OV, Rx and sats within 30 days.  I have spoke with pt and made him aware. Pt has been scheduled for OV on 02-22-16 @ 3:30p. Nothing further needed.

## 2016-02-20 ENCOUNTER — Telehealth: Payer: Self-pay | Admitting: Adult Health

## 2016-02-20 NOTE — Telephone Encounter (Signed)
Spoke with pt. He is needing additional information sent to [facility] for his oxygen. [facility]. Nothing further was needed.

## 2016-02-21 ENCOUNTER — Telehealth: Payer: Self-pay | Admitting: Adult Health

## 2016-02-21 DIAGNOSIS — J9611 Chronic respiratory failure with hypoxia: Secondary | ICD-10-CM

## 2016-02-21 NOTE — Telephone Encounter (Signed)
Spoke with pt, states that he spoke with [facility] this morning and they stated they were still awaiting more recs.   ATC [facility] to see what more is needed, office is closed.  wcb tomorrow morning.

## 2016-02-22 ENCOUNTER — Telehealth: Payer: Self-pay | Admitting: Adult Health

## 2016-02-22 ENCOUNTER — Ambulatory Visit: Payer: Medicare Other | Admitting: Adult Health

## 2016-02-22 NOTE — Telephone Encounter (Signed)
Called and spoke with [facility] and they stated that the rx that was sent to them was from 2010 and the OV notes was only the HP sent---the testing documents were from 2010 and the rx did not fully stated everything that is needed as how the pt is receiving the oxygen--via mask or [place]. She stated that medicare will not cover this for the pt.     Placed new order for DME and [facility]  I have faxed over the OV note from TP.  Nothing further is needed.

## 2016-02-22 NOTE — Telephone Encounter (Signed)
Spoke with pt. He was checking on his oxygen from [facility]. Advised him that we have sent [facility] the oxygen order and the documentation they need to go along with it. Pt was very appreciative. Nothing further was needed.

## 2016-02-25 ENCOUNTER — Telehealth: Payer: Self-pay | Admitting: Pulmonary Disease

## 2016-02-25 NOTE — Telephone Encounter (Signed)
[facility] has been placed in JN's look at folder to be signed. Will route to Carleigh to f/u on.

## 2016-02-26 ENCOUNTER — Other Ambulatory Visit: Payer: Self-pay | Admitting: Cardiology

## 2016-02-28 ENCOUNTER — Telehealth: Payer: Self-pay | Admitting: Pulmonary Disease

## 2016-02-29 NOTE — Telephone Encounter (Signed)
This information was with JN's look-ats. This form has [facility]. Called [facility] and was advised they received the fax and nothing further is needed from us, they are waiting on insurance approval for O2. Will sign off.

## 2016-03-05 NOTE — Telephone Encounter (Signed)
Prescription for oxygen therapy: oxygen concentrator system, portable gaseous oxygen system, conserving device (3 LPM via [place]), gas stationary oxygen contents, gas portable oxygen contents, oxygen at 3 LMP continuous [place] and 2 LPM via [place] during sleep for length of 99 months was faxed to [facility] at [phone]. Nothing further is needed.

## 2016-03-14 NOTE — Telephone Encounter (Signed)
A fax was sent to [facility] at [phone] for Prednisone 1 mg tablets for 90 day supply with 4 refills with directions to take as directed. A confirmation fax log was received. Nothing further is needed.

## 2016-03-20 ENCOUNTER — Ambulatory Visit (INDEPENDENT_AMBULATORY_CARE_PROVIDER_SITE_OTHER): Payer: Medicare Other | Admitting: *Deleted

## 2016-03-20 DIAGNOSIS — I482 Chronic atrial fibrillation, unspecified: Secondary | ICD-10-CM

## 2016-03-20 DIAGNOSIS — Z5181 Encounter for therapeutic drug level monitoring: Secondary | ICD-10-CM | POA: Diagnosis not present

## 2016-03-20 DIAGNOSIS — I4891 Unspecified atrial fibrillation: Secondary | ICD-10-CM

## 2016-03-20 DIAGNOSIS — Z7901 Long term (current) use of anticoagulants: Secondary | ICD-10-CM

## 2016-03-20 LAB — POCT INR: INR: 2.4

## 2016-04-11 ENCOUNTER — Inpatient Hospital Stay (HOSPITAL_COMMUNITY)
Admission: EM | Admit: 2016-04-11 | Discharge: 2016-04-13 | DRG: 644 | Disposition: A | Discharge status: Home / Self Care | Payer: Medicare Other | Attending: Internal Medicine | Admitting: Internal Medicine

## 2016-04-11 ENCOUNTER — Emergency Department (HOSPITAL_COMMUNITY): Payer: Medicare Other

## 2016-04-11 ENCOUNTER — Encounter (HOSPITAL_COMMUNITY): Payer: Self-pay | Admitting: Emergency Medicine

## 2016-04-11 ENCOUNTER — Telehealth: Payer: Self-pay | Admitting: Pulmonary Disease

## 2016-04-11 DIAGNOSIS — M25569 Pain in unspecified knee: Secondary | ICD-10-CM

## 2016-04-11 DIAGNOSIS — I5032 Chronic diastolic (congestive) heart failure: Secondary | ICD-10-CM | POA: Diagnosis present

## 2016-04-11 DIAGNOSIS — J449 Chronic obstructive pulmonary disease, unspecified: Secondary | ICD-10-CM | POA: Diagnosis present

## 2016-04-11 DIAGNOSIS — Z87891 Personal history of nicotine dependence: Secondary | ICD-10-CM

## 2016-04-11 DIAGNOSIS — M1990 Unspecified osteoarthritis, unspecified site: Secondary | ICD-10-CM | POA: Diagnosis present

## 2016-04-11 DIAGNOSIS — Z7984 Long term (current) use of oral hypoglycemic drugs: Secondary | ICD-10-CM

## 2016-04-11 DIAGNOSIS — E1140 Type 2 diabetes mellitus with diabetic neuropathy, unspecified: Secondary | ICD-10-CM | POA: Diagnosis present

## 2016-04-11 DIAGNOSIS — E785 Hyperlipidemia, unspecified: Secondary | ICD-10-CM | POA: Diagnosis present

## 2016-04-11 DIAGNOSIS — I482 Chronic atrial fibrillation: Secondary | ICD-10-CM | POA: Diagnosis present

## 2016-04-11 DIAGNOSIS — M109 Gout, unspecified: Secondary | ICD-10-CM | POA: Diagnosis present

## 2016-04-11 DIAGNOSIS — M25561 Pain in right knee: Secondary | ICD-10-CM | POA: Diagnosis present

## 2016-04-11 DIAGNOSIS — N184 Chronic kidney disease, stage 4 (severe): Secondary | ICD-10-CM | POA: Diagnosis present

## 2016-04-11 DIAGNOSIS — E860 Dehydration: Secondary | ICD-10-CM | POA: Diagnosis present

## 2016-04-11 DIAGNOSIS — E2740 Unspecified adrenocortical insufficiency: Secondary | ICD-10-CM | POA: Diagnosis not present

## 2016-04-11 DIAGNOSIS — R627 Adult failure to thrive: Secondary | ICD-10-CM | POA: Diagnosis present

## 2016-04-11 DIAGNOSIS — E1151 Type 2 diabetes mellitus with diabetic peripheral angiopathy without gangrene: Secondary | ICD-10-CM | POA: Diagnosis present

## 2016-04-11 DIAGNOSIS — Z888 Allergy status to other drugs, medicaments and biological substances status: Secondary | ICD-10-CM

## 2016-04-11 DIAGNOSIS — E1122 Type 2 diabetes mellitus with diabetic chronic kidney disease: Secondary | ICD-10-CM | POA: Diagnosis present

## 2016-04-11 DIAGNOSIS — IMO0002 Reserved for concepts with insufficient information to code with codable children: Secondary | ICD-10-CM | POA: Diagnosis present

## 2016-04-11 DIAGNOSIS — I130 Hypertensive heart and chronic kidney disease with heart failure and stage 1 through stage 4 chronic kidney disease, or unspecified chronic kidney disease: Secondary | ICD-10-CM | POA: Diagnosis present

## 2016-04-11 DIAGNOSIS — I2510 Atherosclerotic heart disease of native coronary artery without angina pectoris: Secondary | ICD-10-CM | POA: Diagnosis present

## 2016-04-11 DIAGNOSIS — Z7982 Long term (current) use of aspirin: Secondary | ICD-10-CM

## 2016-04-11 DIAGNOSIS — Z7952 Long term (current) use of systemic steroids: Secondary | ICD-10-CM

## 2016-04-11 DIAGNOSIS — I739 Peripheral vascular disease, unspecified: Secondary | ICD-10-CM | POA: Diagnosis present

## 2016-04-11 DIAGNOSIS — E875 Hyperkalemia: Secondary | ICD-10-CM | POA: Diagnosis present

## 2016-04-11 DIAGNOSIS — E869 Volume depletion, unspecified: Secondary | ICD-10-CM | POA: Diagnosis present

## 2016-04-11 DIAGNOSIS — Z79899 Other long term (current) drug therapy: Secondary | ICD-10-CM

## 2016-04-11 DIAGNOSIS — Z7901 Long term (current) use of anticoagulants: Secondary | ICD-10-CM

## 2016-04-11 DIAGNOSIS — G8929 Other chronic pain: Secondary | ICD-10-CM | POA: Diagnosis present

## 2016-04-11 DIAGNOSIS — N179 Acute kidney failure, unspecified: Secondary | ICD-10-CM | POA: Diagnosis present

## 2016-04-11 DIAGNOSIS — I2729 Other secondary pulmonary hypertension: Secondary | ICD-10-CM | POA: Diagnosis present

## 2016-04-11 DIAGNOSIS — J439 Emphysema, unspecified: Secondary | ICD-10-CM | POA: Diagnosis present

## 2016-04-11 DIAGNOSIS — Z955 Presence of coronary angioplasty implant and graft: Secondary | ICD-10-CM

## 2016-04-11 DIAGNOSIS — Z7951 Long term (current) use of inhaled steroids: Secondary | ICD-10-CM

## 2016-04-11 LAB — COMPREHENSIVE METABOLIC PANEL
ALK PHOS: 41 U/L (ref 38–126)
ALT: 15 U/L — AB (ref 17–63)
AST: 21 U/L (ref 15–41)
Albumin: 3.4 g/dL — ABNORMAL LOW (ref 3.5–5.0)
Anion gap: 8 (ref 5–15)
BILIRUBIN TOTAL: 0.4 mg/dL (ref 0.3–1.2)
BUN: 38 mg/dL — AB (ref 6–20)
CALCIUM: 9 mg/dL (ref 8.9–10.3)
CO2: 28 mmol/L (ref 22–32)
CREATININE: 2.93 mg/dL — AB (ref 0.61–1.24)
Chloride: 101 mmol/L (ref 101–111)
GFR calc Af Amer: 22 mL/min — ABNORMAL LOW (ref >60)
GFR, EST NON AFRICAN AMERICAN: 19 mL/min — AB (ref >60)
Glucose, Bld: 95 mg/dL (ref 65–99)
POTASSIUM: 6.7 mmol/L — AB (ref 3.5–5.1)
Sodium: 137 mmol/L (ref 135–145)
TOTAL PROTEIN: 6.3 g/dL — AB (ref 6.5–8.1)

## 2016-04-11 LAB — CBC WITH DIFFERENTIAL/PLATELET
BASOS ABS: 0.1 10*3/uL (ref 0.0–0.1)
BASOS PCT: 1 %
EOS ABS: 0.3 10*3/uL (ref 0.0–0.7)
EOS PCT: 3 %
HCT: 42.8 % (ref 39.0–52.0)
Hemoglobin: 14 g/dL (ref 13.0–17.0)
Lymphocytes Relative: 13 %
Lymphs Abs: 1.3 10*3/uL (ref 0.7–4.0)
MCH: 31 pg (ref 26.0–34.0)
MCHC: 32.7 g/dL (ref 30.0–36.0)
MCV: 94.9 fL (ref 78.0–100.0)
Monocytes Absolute: 1.2 10*3/uL — ABNORMAL HIGH (ref 0.1–1.0)
Monocytes Relative: 12 %
Neutro Abs: 7.1 10*3/uL (ref 1.7–7.7)
Neutrophils Relative %: 71 %
PLATELETS: 308 10*3/uL (ref 150–400)
RBC: 4.51 MIL/uL (ref 4.22–5.81)
RDW: 13.2 % (ref 11.5–15.5)
WBC: 10 10*3/uL (ref 4.0–10.5)

## 2016-04-11 LAB — BASIC METABOLIC PANEL
Anion gap: 8 (ref 5–15)
BUN: 35 mg/dL — ABNORMAL HIGH (ref 6–20)
CALCIUM: 8.3 mg/dL — AB (ref 8.9–10.3)
CO2: 23 mmol/L (ref 22–32)
CREATININE: 2.37 mg/dL — AB (ref 0.61–1.24)
Chloride: 105 mmol/L (ref 101–111)
GFR calc non Af Amer: 25 mL/min — ABNORMAL LOW (ref >60)
GFR, EST AFRICAN AMERICAN: 29 mL/min — AB (ref >60)
Glucose, Bld: 132 mg/dL — ABNORMAL HIGH (ref 65–99)
Potassium: 5 mmol/L (ref 3.5–5.1)
SODIUM: 136 mmol/L (ref 135–145)

## 2016-04-11 LAB — PROTIME-INR
INR: 2.04
PROTHROMBIN TIME: 23.3 s — AB (ref 11.4–15.2)

## 2016-04-11 LAB — URINALYSIS, ROUTINE W REFLEX MICROSCOPIC
BILIRUBIN URINE: NEGATIVE
GLUCOSE, UA: NEGATIVE mg/dL
HGB URINE DIPSTICK: NEGATIVE
KETONES UR: NEGATIVE mg/dL
Leukocytes, UA: NEGATIVE
NITRITE: NEGATIVE
PH: 5 (ref 5.0–8.0)
Protein, ur: NEGATIVE mg/dL
SPECIFIC GRAVITY, URINE: 1.005 (ref 1.005–1.030)

## 2016-04-11 LAB — BLOOD GAS, VENOUS
Acid-Base Excess: 1.8 mmol/L (ref 0.0–2.0)
BICARBONATE: 23.7 mmol/L (ref 20.0–28.0)
FIO2: 21
O2 SAT: 31.7 %
PATIENT TEMPERATURE: 98.6
PCO2 VEN: 49.9 mmHg (ref 44.0–60.0)
pH, Ven: 7.35 (ref 7.250–7.430)

## 2016-04-11 LAB — TROPONIN I: Troponin I: <0.03 ng/mL (ref <0.03)

## 2016-04-11 LAB — DIGOXIN LEVEL: Digoxin Level: 0.5 ng/mL — ABNORMAL LOW (ref 0.8–2.0)

## 2016-04-11 LAB — POC OCCULT BLOOD, ED: FECAL OCCULT BLD: NEGATIVE

## 2016-04-11 MED ORDER — ALBUTEROL SULFATE HFA 108 (90 BASE) MCG/ACT IN AERS: 2.0000 | INHALATION_SPRAY | Freq: Four times a day (QID) | RESPIRATORY_TRACT | Status: DC | PRN

## 2016-04-11 MED ORDER — ACETAMINOPHEN 650 MG RE SUPP: 650.0000 mg | Freq: Four times a day (QID) | RECTAL | Status: DC | PRN

## 2016-04-11 MED ORDER — DOCUSATE SODIUM 100 MG PO CAPS
100.0000 mg | ORAL_CAPSULE | Freq: Two times a day (BID) | ORAL | Status: DC
  Administered 2016-04-11 – 2016-04-13 (×4): 100 mg via ORAL
  Filled 2016-04-11 (×4): qty 1

## 2016-04-11 MED ORDER — PREDNISONE 10 MG PO TABS: 5.0000 mg | ORAL_TABLET | Freq: Every day | ORAL | Status: DC

## 2016-04-11 MED ORDER — INSULIN ASPART 100 UNIT/ML ~~LOC~~ SOLN
SUBCUTANEOUS | Status: AC
  Filled 2016-04-11: qty 1

## 2016-04-11 MED ORDER — MOMETASONE FURO-FORMOTEROL FUM 200-5 MCG/ACT IN AERO
2.0000 | INHALATION_SPRAY | Freq: Two times a day (BID) | RESPIRATORY_TRACT | Status: DC
  Administered 2016-04-11 – 2016-04-13 (×4): 2 via RESPIRATORY_TRACT
  Filled 2016-04-11: qty 8.8

## 2016-04-11 MED ORDER — TIOTROPIUM BROMIDE MONOHYDRATE 18 MCG IN CAPS
18.0000 ug | ORAL_CAPSULE | Freq: Every day | RESPIRATORY_TRACT | Status: DC
  Administered 2016-04-12 – 2016-04-13 (×2): 18 ug via RESPIRATORY_TRACT
  Filled 2016-04-11: qty 5

## 2016-04-11 MED ORDER — ACETAMINOPHEN 325 MG PO TABS: 650.0000 mg | ORAL_TABLET | Freq: Four times a day (QID) | ORAL | Status: DC | PRN

## 2016-04-11 MED ORDER — DEXTROSE 50 % IV SOLN
1.0000 | Freq: Once | INTRAVENOUS | Status: AC
  Administered 2016-04-11: 50 mL via INTRAVENOUS

## 2016-04-11 MED ORDER — ASPIRIN 81 MG PO CHEW
81.0000 mg | CHEWABLE_TABLET | Freq: Every day | ORAL | Status: DC
  Administered 2016-04-12 – 2016-04-13 (×2): 81 mg via ORAL
  Filled 2016-04-11 (×2): qty 1

## 2016-04-11 MED ORDER — FINASTERIDE 5 MG PO TABS
5.0000 mg | ORAL_TABLET | Freq: Every day | ORAL | Status: DC
  Administered 2016-04-11 – 2016-04-12 (×2): 5 mg via ORAL
  Filled 2016-04-11 (×4): qty 1

## 2016-04-11 MED ORDER — HYDROMORPHONE HCL 1 MG/ML IJ SOLN: 0.5000 mg | INTRAMUSCULAR | Status: DC | PRN

## 2016-04-11 MED ORDER — INSULIN ASPART 100 UNIT/ML IV SOLN
10.0000 [IU] | Freq: Once | INTRAVENOUS | Status: AC
  Administered 2016-04-11: 10 [IU] via INTRAVENOUS

## 2016-04-11 MED ORDER — HYDROCORTISONE NA SUCCINATE PF 100 MG IJ SOLR
50.0000 mg | Freq: Three times a day (TID) | INTRAMUSCULAR | Status: AC
  Administered 2016-04-11 – 2016-04-12 (×3): 50 mg via INTRAVENOUS
  Filled 2016-04-11 (×3): qty 2

## 2016-04-11 MED ORDER — SODIUM CHLORIDE 0.9 % IV BOLUS (SEPSIS)
1000.0000 mL | Freq: Once | INTRAVENOUS | Status: AC
  Administered 2016-04-11: 1000 mL via INTRAVENOUS

## 2016-04-11 MED ORDER — SODIUM CHLORIDE 0.9 % IV SOLN
INTRAVENOUS | Status: DC
  Administered 2016-04-11 – 2016-04-12 (×2): via INTRAVENOUS

## 2016-04-11 MED ORDER — FINASTERIDE 5 MG PO TABS
ORAL_TABLET | ORAL | Status: AC
  Filled 2016-04-11: qty 1

## 2016-04-11 MED ORDER — SODIUM CHLORIDE 0.9% FLUSH
3.0000 mL | Freq: Two times a day (BID) | INTRAVENOUS | Status: DC
  Administered 2016-04-11: 3 mL via INTRAVENOUS

## 2016-04-11 MED ORDER — WARFARIN SODIUM 2.5 MG PO TABS
2.5000 mg | ORAL_TABLET | Freq: Once | ORAL | Status: AC
  Administered 2016-04-11: 2.5 mg via ORAL
  Filled 2016-04-11: qty 1

## 2016-04-11 MED ORDER — ALBUTEROL SULFATE (2.5 MG/3ML) 0.083% IN NEBU: 2.5000 mg | INHALATION_SOLUTION | Freq: Four times a day (QID) | RESPIRATORY_TRACT | Status: DC | PRN

## 2016-04-11 MED ORDER — DEXTROSE 50 % IV SOLN
INTRAVENOUS | Status: AC
  Filled 2016-04-11: qty 50

## 2016-04-11 MED ORDER — PRAVASTATIN SODIUM 10 MG PO TABS
20.0000 mg | ORAL_TABLET | Freq: Every day | ORAL | Status: DC
  Administered 2016-04-11 – 2016-04-12 (×2): 20 mg via ORAL
  Filled 2016-04-11 (×2): qty 2

## 2016-04-11 MED ORDER — SODIUM CHLORIDE 0.9 % IV SOLN
1.0000 g | Freq: Once | INTRAVENOUS | Status: AC
  Administered 2016-04-11: 1 g via INTRAVENOUS
  Filled 2016-04-11: qty 10

## 2016-04-11 MED ORDER — MOMETASONE FURO-FORMOTEROL FUM 200-5 MCG/ACT IN AERO
INHALATION_SPRAY | RESPIRATORY_TRACT | Status: AC
  Filled 2016-04-11: qty 8.8

## 2016-04-11 MED ORDER — WARFARIN - PHARMACIST DOSING INPATIENT: Freq: Every day | Status: DC

## 2016-04-11 NOTE — ED Triage Notes (Signed)
PT was seen at [facility] today and was told to come to ED for further eval d/t generalized weakness and body aches with congested non-productive cough x4 days.

## 2016-04-11 NOTE — ED Notes (Signed)
Gave patient meal tray as requested.   

## 2016-04-11 NOTE — H&P (Addendum)
History and Physical    [name] JXB:[id number] DOB: 1940/06/22 DOA: 04/11/2016  PCP: Juliette Alcide, MD  Patient coming from: Home.    Chief Complaint:   Weakness, diffuse pain, unable to ambulate.   HPI: [name] is an 76 y.o. male lives at home, with multiple complex medical problems including CKD, HTN, HLD, afib on coumadin and Dig, along with BB, DM on Metformin and ACE I, Gout on colchicine, hx of PVD, COPD, pulmonary HTN, presented to the ER with weakness, nausea, decrease in appetite, and general failure.  He has a hard time ambulating and his joints and legs hurts.  He has been on steroid tapered, from  down now to about , and felt awful.  He hadn't been eating or drinking as much, and continued to take his ACE I and Dig, Metformin, and Lasix.  Evaluation in the ER showed Cr of 2.9, K of 6.2.  His Cr was 1.5 two months ago.  His CXR was clear, INR was good at 2.0, LFTs, WBC, Hb were all unremarkable.  UA was negative.  Hospitalist was asked to admit him for further tx.  His Dig level was 0.5. BP was low, but responding to IVF.   ED Course:  See above.  Rewiew of Systems:  Constitutional: Negative for malaise, fever and chills. No significant weight loss or weight gain Eyes: Negative for eye pain, redness and discharge, diplopia, visual changes, or flashes of light. ENMT: Negative for ear pain, hoarseness, nasal congestion, sinus pressure and sore throat. No headaches; tinnitus, drooling, or problem swallowing. Cardiovascular: Negative for chest pain, palpitations, diaphoresis, dyspnea and peripheral edema. ; No orthopnea, PND Respiratory: Negative for cough, hemoptysis, wheezing and stridor. No pleuritic chestpain. Gastrointestinal: Negative for diarrhea, constipation,  melena, blood in stool, hematemesis, jaundice and rectal bleeding.    Genitourinary: Negative for frequency, dysuria, incontinence,flank pain and hematuria; Musculoskeletal: Negative for back pain  and neck pain. Negative for swelling and trauma.;  Skin: . Negative for pruritus, rash, abrasions, bruising and skin lesion.; ulcerations Neuro: Negative for headache, lightheadedness and neck stiffness. Negative for alltered level of consciousness , altered mental status,  burning feet, involuntary movement, seizure and syncope.  Psych: negative for anxiety, depression, insomnia, tearfulness, panic attacks, hallucinations, paranoia, suicidal or homicidal ideation    Past Medical History:  Diagnosis Date  . CAD (coronary artery disease)    DES to RCA 2004, moderate residual LAD diisease  . Chronic atrial fibrillation (HCC)   . Chronic diastolic CHF (congestive heart failure) (HCC)   . COPD with emphysema (HCC)    Followed by Pulmonary  . Drug allergy    Plavix causes severe rash  . Hyperlipemia   . Hypertension   . Obesity   . Peripheral arterial disease (HCC)   . Right ventricular dysfunction   . Secondary pulmonary hypertension    Moderate in setting of COPD  . Type 2 diabetes mellitus with diabetic neuropathy [facility])     Past Surgical History:  Procedure Laterality Date  . CATARACT EXTRACTION Bilateral   . CORONARY STENT PLACEMENT  2004  . KNEE SURGERY    . ROTATOR CUFF REPAIR     x 3     reports that he quit smoking about 18 years ago. His smoking use included Cigarettes. He started smoking about 60 years ago. He has a 96.00 pack-year smoking history. He has never used smokeless tobacco. He reports that he does not drink alcohol or use drugs.  Allergies  Allergen Reactions  . Clopidogrel Bisulfate     REACTION: rash    Family History  Problem Relation Age of Onset  . Heart disease Mother   . Stroke Mother   . Throat cancer Father   . Lung disease Neg Hx      Prior to Admission medications   Medication Sig Start Date End Date Taking? Authorizing Provider  albuterol (PROVENTIL) (2.5 MG/3ML) 0.083% nebulizer solution Take 3 mLs (2.5 mg total) by nebulization every  6 (six) hours as needed for wheezing. 02/12/15   Roslynn Amble, MD  albuterol (VENTOLIN HFA) 108 (90 Base) MCG/ACT inhaler Inhale 2 puffs into the lungs every 6 (six) hours as needed for wheezing. 02/12/15   Roslynn Amble, MD  aspirin 81 MG tablet Take 81 mg by mouth daily.      Historical Provider, MD  calcium elemental as carbonate (PX ANTACID MAXIMUM STRENGTH) 400 MG tablet Chew 400 mg by mouth as needed.    Historical Provider, MD  digoxin (DIGOX) 0.125 MG tablet Take 0.5 tablets (62.5 mcg total) by mouth daily. 12/03/15   Jonelle Sidle, MD  docusate sodium (STOOL SOFTENER) 100 MG capsule Take 100 mg by mouth 2 (two) times daily.    Historical Provider, MD  finasteride (PROSCAR) 5 MG tablet Take 5 mg by mouth daily. 04/12/14   Historical Provider, MD  furosemide (LASIX) 80 MG tablet TAKE 1 AND 1/2 TABLETS BY MOUTH EVERY MORNING AND TAKE ONE TABLET BY MOUTH EVERY EVENING. 12/03/15   Jonelle Sidle, MD  gabapentin (NEURONTIN) 600 MG tablet Take 2 tablets by mouth 3 (three) times daily. 11/01/14   Historical Provider, MD  lovastatin (MEVACOR) 40 MG tablet Take 40 mg by mouth at bedtime.    Historical Provider, MD  metFORMIN (GLUCOPHAGE) 500 MG tablet Take 500 mg by mouth 2 (two) times daily.     Historical Provider, MD  metoprolol succinate (TOPROL-XL) 25 MG 24 hr tablet Take 0.5 tablets (12.5 mg total) by mouth daily. 07/04/15   Henderson Cloud, MD  Multiple Vitamins-Minerals (CENTRUM PO) Take 1 tablet by mouth daily.      Historical Provider, MD  NITROSTAT 0.4 MG SL tablet Place 0.4 mg under the tongue every 5 (five) minutes as needed.     Historical Provider, MD  potassium chloride SA (K-DUR,KLOR-CON) 20 MEQ tablet TAKE TWO (2) TABLETS BY MOUTH TWICE DAILY. 12/05/15   Jonelle Sidle, MD  predniSONE (DELTASONE) 2.5 MG tablet Take 2 tablets (5 mg total) by mouth daily with breakfast. 12/04/15   Roslynn Amble, MD  SYMBICORT 160-4.5 MCG/ACT inhaler INHALE TWO PUFFS BY MOUTH  TWICE DAILY 08/09/14   Storm Frisk, MD  tamsulosin (FLOMAX) 0.4 MG CAPS capsule Take 0.4 mg by mouth daily. 09/26/13   Historical Provider, MD  tiotropium (SPIRIVA) 18 MCG inhalation capsule Place 18 mcg into inhaler and inhale daily.    Historical Provider, MD  ULORIC 40 MG tablet Take 1 tablet by mouth daily. 04/30/15   Historical Provider, MD  warfarin (COUMADIN) 5 MG tablet TAKE 1 TABLET DAILY EXCEPT TAKE ONE-HALF (1/2) TABLET ON MONDAY, WEDNESDAY, AND FRIDAY OR AS DIRECTED BY COUMADIN CLINIC 02/27/16   Jonelle Sidle, MD    Physical Exam: Vitals:   04/11/16 1400 04/11/16 1415 04/11/16 1430 04/11/16 1500  BP:  (!) 90/57 95/63 (!) 125/112  Pulse: (!) 112   (!) 143  Resp: (!) Temp:  TempSrc:      SpO2: 93%   (!) 45%  Weight:      Height:          Constitutional: NAD, calm, comfortable Vitals:   04/11/16 1400 04/11/16 1415 04/11/16 1430 04/11/16 1500  BP:  (!) 90/57 95/63 (!) 125/112  Pulse: (!) 112   (!) 143  Resp: (!) Temp:      TempSrc:      SpO2: 93%   (!) 45%  Weight:      Height:       Eyes: PERRL, lids and conjunctivae normal ENMT: Mucous membranes are moist. Posterior pharynx clear of any exudate or lesions.Normal dentition.  Neck: normal, supple, no masses, no thyromegaly Respiratory: clear to auscultation bilaterally, no wheezing, no crackles. Normal respiratory effort. No accessory muscle use.  Cardiovascular: Regular rate and rhythm, no murmurs / rubs / gallops. No extremity edema. 2+ pedal pulses. No carotid bruits.  Abdomen: no tenderness, no masses palpated. No hepatosplenomegaly. Bowel sounds positive.  Musculoskeletal: no clubbing / cyanosis. No joint deformity upper and lower extremities. Good ROM, no contractures. Normal muscle tone.  Skin: no rashes, lesions, ulcers. No induration Neurologic: CN 2-12 grossly intact. Sensation intact, DTR normal. Strength 5/5 in all 4.  Psychiatric: Normal judgment and insight. Alert and  oriented x 3. Normal mood.     Labs on Admission: I have personally reviewed following labs and imaging studies  CBC:  Recent Labs Lab 04/11/16 1353  WBC 10.0  NEUTROABS 7.1  HGB 14.0  HCT 42.8  MCV 94.9  PLT 308   Basic Metabolic Panel:  Recent Labs Lab 04/11/16 1353  NA 137  K 6.7*  CL 101  CO2 28  GLUCOSE 95  BUN 38*  CREATININE 2.93*  CALCIUM 9.0   GFR: Estimated Creatinine Clearance: 20.8 mL/min (A) (by C-G formula based on SCr of 2.93 mg/dL (H)). Liver Function Tests:  Recent Labs Lab 04/11/16 1353  AST 21  ALT 15*  ALKPHOS 41  BILITOT 0.4  PROT 6.3*  ALBUMIN 3.4*   Coagulation Profile:  Recent Labs Lab 04/11/16 1353  INR 2.04   Cardiac Enzymes:  Recent Labs Lab 04/11/16 1353  TROPONINI <0.03   Urine analysis:    Component Value Date/Time   COLORURINE YELLOW 07/02/2015 0111   APPEARANCEUR CLEAR 07/02/2015 0111   LABSPEC 1.010 07/02/2015 0111   PHURINE 5.5 07/02/2015 0111   GLUCOSEU NEGATIVE 07/02/2015 0111   HGBUR NEGATIVE 07/02/2015 0111   BILIRUBINUR NEGATIVE 07/02/2015 0111   KETONESUR NEGATIVE 07/02/2015 0111   PROTEINUR NEGATIVE 07/02/2015 0111   NITRITE NEGATIVE 07/02/2015 0111   LEUKOCYTESUR NEGATIVE 07/02/2015 0111   Radiological Exams on Admission: Dg Chest 2 View  Result Date: 04/11/2016 CLINICAL DATA:  Patient with shortness of breath, cough and weakness. EXAM: CHEST  2 VIEW COMPARISON:  Chest radiograph 07/02/2015 FINDINGS: Monitoring leads overlie the patient. Stable cardiomegaly. Calcification of the thoracic aorta. No consolidative pulmonary opacities. No pleural effusion or pneumothorax. Mid thoracic spine degenerative changes. IMPRESSION: Cardiomegaly.  No acute cardiopulmonary process. Electronically Signed   By: Annia Belt M.D.   On: 04/11/2016 14:49    EKG: Independently reviewed.   Assessment/Plan Principal Problem:   AKI (acute kidney injury) (HCC) Active Problems:   Type 2 diabetes mellitus with  diabetic neuropathy (HCC)   Volume depletion   COPD with emphysema (HCC)   Peripheral arterial disease (HCC)   Secondary pulmonary hypertension   Hyperkalemia   Current chronic  use of systemic steroids   Gout attack    PLAN:   SCENARIO:  It may have been that he was tapered down on his steroid, became weak and having nausea, decreased his appetite, not keeping up with his fluid, went into AKI, elevated Cr, and became hypotensive, hyperkalemic, and having more side effects from his DIg, Metformin, etc...  AKI:  Pre-renal.  Will d/c Lasix.  Hold Dig and Metformin.  Give IVF, follow Cr carefully.    Afib:  Rate is controlled, given hypotension, will hold BB.  Hold Dig due to AKI.  Continue with Coumadin per pharmacy dosing.  DM:  D/C Metformin in the setting of AKI.  Check B12, as it can cause B12 deficiency.  Use sensitive SSI, as needed.  Gout:  Hold Colchicine.  Increase steroid and IV solucortef should help.  Give a little narcotic if he has too much pain.  IVF will help as well.  COPD:  Stable.  Will continue with meds.  Hyperkalemia:  D/C Supplement.  D/C ACE I.  Follow and check at 9pm and in the am.    Adrenal Insufficiency: Feeling malaise is expected with taper, but this is rather severe.  Will increase to  per day.  Give quick acting IV Solucortef.  He will need slower tapering outpatient, I think.   DVT prophylaxis: Coumadin.  Code Status: FULL CODE>  Family Communication: Wife at bedside.  Disposition Plan: to home when better.  Consults called: None. Admission status: Inpatient.    Allen Basista MD FACP. [facility]  If 7PM-7AM, please contact night-coverage [email or web address] Password [facility]  04/11/2016, 3:49 PM

## 2016-04-11 NOTE — Progress Notes (Signed)
ANTICOAGULATION CONSULT NOTE - Initial Consult  Pharmacy Consult for Coumadin Indication: atrial fibrillation  Allergies  Allergen Reactions  . Clopidogrel Bisulfate Rash    Patient Measurements: Height:  (175.3 cm) Weight: 173 lb 9.6 oz (78.7 kg) IBW/kg (Calculated) : 70.7  Vital Signs: Temp: 97.9 F (36.6 C) (04/06 1842) Temp Source: Rectal (04/06 1340) BP: 92/51 (04/06 1842) Pulse Rate: 97 (04/06 1735)  Labs:  Recent Labs  04/11/16 1353  HGB 14.0  HCT 42.8  PLT 308  LABPROT 23.3*  INR 2.04  CREATININE 2.93*  TROPONINI <0.03    Estimated Creatinine Clearance: 20.8 mL/min (A) (by C-G formula based on SCr of 2.93 mg/dL (H)).   Medical History: Past Medical History:  Diagnosis Date  . CAD (coronary artery disease)    DES to RCA 2004, moderate residual LAD diisease  . Chronic atrial fibrillation (HCC)   . Chronic diastolic CHF (congestive heart failure) (HCC)   . COPD with emphysema (HCC)    Followed by Pulmonary  . Drug allergy    Plavix causes severe rash  . Hyperlipemia   . Hypertension   . Obesity   . Peripheral arterial disease (HCC)   . Right ventricular dysfunction   . Secondary pulmonary hypertension    Moderate in setting of COPD  . Type 2 diabetes mellitus with diabetic neuropathy (HCC)     Medications:  Prescriptions Prior to Admission  Medication Sig Dispense Refill Last Dose  . albuterol (PROVENTIL) (2.5 MG/3ML) 0.083% nebulizer solution Take 3 mLs (2.5 mg total) by nebulization every 6 (six) hours as needed for wheezing. 75 mL 11 unknown  . albuterol (VENTOLIN HFA) 108 (90 Base) MCG/ACT inhaler Inhale 2 puffs into the lungs every 6 (six) hours as needed for wheezing. 1 Inhaler 6 unknown  . aspirin 81 MG tablet Take 81 mg by mouth daily.     04/11/2016 at Unknown time  . digoxin (DIGOX) 0.125 MG tablet Take 0.5 tablets (62.5 mcg total) by mouth daily. 45 tablet 3 04/10/2016 at Unknown time  . docusate sodium (STOOL SOFTENER) 100 MG  capsule Take 100 mg by mouth daily.    04/11/2016 at Unknown time  . finasteride (PROSCAR) 5 MG tablet Take 5 mg by mouth daily.  3 04/10/2016 at Unknown time  . furosemide (LASIX) 80 MG tablet TAKE 1 AND 1/2 TABLETS BY MOUTH EVERY MORNING AND TAKE ONE TABLET BY MOUTH EVERY EVENING. 225 tablet 3 04/11/2016 at Unknown time  . gabapentin (NEURONTIN) 600 MG tablet Take 2 tablets by mouth 3 (three) times daily.  6 04/11/2016 at Unknown time  . lisinopril (PRINIVIL,ZESTRIL) 5 MG tablet Take 5 mg by mouth every evening.   04/10/2016 at Unknown time  . lovastatin (MEVACOR) 40 MG tablet Take 40 mg by mouth at bedtime.   04/10/2016 at Unknown time  . metFORMIN (GLUCOPHAGE-XR) 500 MG 24 hr tablet Take 500 mg by mouth daily with breakfast.   04/11/2016 at Unknown time  . metoprolol (LOPRESSOR) 50 MG tablet Take 25 mg by mouth 2 (two) times daily.   04/11/2016 at 700a  . Multiple Vitamins-Minerals (CENTRUM PO) Take 1 tablet by mouth daily.     04/11/2016 at Unknown time  . NITROSTAT 0.4 MG SL tablet Place 0.4 mg under the tongue every 5 (five) minutes as needed.    unknown  . potassium chloride SA (K-DUR,KLOR-CON) 20 MEQ tablet TAKE TWO (2) TABLETS BY MOUTH TWICE DAILY. 180 tablet 3 04/11/2016 at Unknown time  . predniSONE (DELTASONE) 1  MG tablet Take 2 mg by mouth daily with breakfast.   04/11/2016 at Unknown time  . SYMBICORT 160-4.5 MCG/ACT inhaler INHALE TWO PUFFS BY MOUTH TWICE DAILY 10.2 g 2 04/11/2016 at Unknown time  . tamsulosin (FLOMAX) 0.4 MG CAPS capsule Take 0.4 mg by mouth 2 (two) times daily.    04/11/2016 at Unknown time  . tiotropium (SPIRIVA) 18 MCG inhalation capsule Place 18 mcg into inhaler and inhale daily.   04/11/2016 at Unknown time  . ULORIC 40 MG tablet Take 1 tablet by mouth daily.  6 04/10/2016 at Unknown time  . warfarin (COUMADIN) 5 MG tablet TAKE 1 TABLET DAILY EXCEPT TAKE ONE-HALF (1/2) TABLET ON MONDAY, WEDNESDAY, AND FRIDAY OR AS DIRECTED BY COUMADIN CLINIC 90 tablet 4 04/10/2016 at 630p    Assessment: 76  y.o. male with multiple, complex medical problems including CKD, HTN, HLD, afib on coumadin and Dig, along with BB, DM on Metformin and ACE I, Gout on colchicine, hx of PVD, COPD, pulmonary HTN, presented to the ER with weakness, nausea, decrease in appetite, and general failure. Pharmacy asked to continue coumadin. Patient takes Coumadin 2.5mg  on MWF, and  on T,TH, Sat, Sun. INR therapeutic, continue home dose.  Goal of Therapy:  INR 2-3 Monitor platelets by anticoagulation protocol: Yes    Plan:  Coumadin 2.5mg  today x 1 PT-INR daily Monitor for S/S of bleeding  Elder Cyphers, BS Loura Back, BCPS Clinical Pharmacist Pager [phone] 04/11/2016,6:57 PM

## 2016-04-11 NOTE — ED Notes (Signed)
CRITICAL VALUE ALERT  Critical value received:  Potassium 6.7  Date of notification:  04/11/16  Time of notification:  1454  Critical value read back:Yes.    Nurse who received alert:  Viviano Simas RN  MD notified (1st page): [facility]

## 2016-04-11 NOTE — ED Provider Notes (Signed)
[facility]-EMERGENCY DEPT Provider Note   CSN: [id number] Arrival date & time: 04/11/16  1320     History   Chief Complaint Chief Complaint  Patient presents with  . Weakness  History is obtained from patient and patient's wife and from records accompany patient.  HPI [name] is a 76 y.o. male.  HPI complains of generalized weakness progressively worsening over the past 3 weeks. Patient reports that he complains of intermittent bilateral leg pain. His wife reports that he's had diminished appetite Sometimes left leg sometimes right leg. Presently complains of pain at right lower leg and at right foot. He's also been coughing for the past 3 weeks. No fever. He saw his primary care physician earlier today and was sent here for further evaluation. Nothing makes symptoms better or worse. No other associated symptoms. No known fever.  Past Medical History:  Diagnosis Date  . CAD (coronary artery disease)    DES to RCA 2004, moderate residual LAD diisease  . Chronic atrial fibrillation (HCC)   . Chronic diastolic CHF (congestive heart failure) (HCC)   . COPD with emphysema (HCC)    Followed by Pulmonary  . Drug allergy    Plavix causes severe rash  . Hyperlipemia   . Hypertension   . Obesity   . Peripheral arterial disease (HCC)   . Right ventricular dysfunction   . Secondary pulmonary hypertension    Moderate in setting of COPD  . Type 2 diabetes mellitus with diabetic neuropathy [facility])     Patient Active Problem List   Diagnosis Date Noted  . Stage III chronic kidney disease 07/03/2015  . Sepsis (HCC) 07/02/2015  . Steroid-dependent chronic obstructive pulmonary disease (HCC) 07/02/2015  . Cellulitis of foot, right   . BPH (benign prostatic hyperplasia) 01/23/2015  . Diabetes mellitus with complication (HCC) 01/23/2015  . Pre-operative cardiovascular examination 01/17/2014  . Peripheral neuropathy (HCC) 01/15/2014  . Lumbar disc disease 01/15/2014  . At high risk for  injury related to fall 11/07/2013  . 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 Encounter for therapeutic drug monitoring 02/04/2013  . Pain in limb- Bilateral leg and right thumb and index finger 08/13/2012  . Drug intolerance 08/11/2012  . At high risk for falls 05/04/2012  . Pain in joint, ankle and foot 03/04/2012  . Edema of both legs 02/13/2012  . Chronic diastolic CHF (congestive heart failure) (HCC)   . PAD (peripheral artery disease) (HCC)   . Venous insufficiency 10/10/2011  . Blisters of multiple sites   . Chronic respiratory failure (HCC) 07/08/2011  . Ejection fraction   . Pulmonary hypertension   . Drug allergy   . Constipation   . Right ventricular dysfunction   . COPD GOLD III    . Chronic atrial fibrillation (HCC)   . Warfarin anticoagulation   . CAD (coronary artery disease)   . Hyperlipemia   . Obesity   . Peripheral edema   . Hypertension   . Foot pain     Past Surgical History:  Procedure Laterality Date  . CATARACT EXTRACTION Bilateral   . CORONARY STENT PLACEMENT  2004  . KNEE SURGERY    . ROTATOR CUFF REPAIR     x 3       Home Medications    Prior to Admission medications   Medication Sig Start Date End Date Taking? Authorizing Provider  albuterol (PROVENTIL) (2.5 MG/3ML) 0.083% nebulizer solution Take 3 mLs (2.5 mg total) by nebulization every 6 (six) hours as needed for wheezing.  02/12/15   Roslynn Amble, MD  albuterol (VENTOLIN HFA) 108 (90 Base) MCG/ACT inhaler Inhale 2 puffs into the lungs every 6 (six) hours as needed for wheezing. 02/12/15   Roslynn Amble, MD  aspirin 81 MG tablet Take 81 mg by mouth daily.      Historical Provider, MD  calcium elemental as carbonate (PX ANTACID MAXIMUM STRENGTH) 400 MG tablet Chew 400 mg by mouth as needed.    Historical Provider, MD  digoxin (DIGOX) 0.125 MG tablet Take 0.5 tablets (62.5 mcg total) by mouth daily. 12/03/15   Jonelle Sidle, MD  docusate sodium (STOOL SOFTENER) 100 MG capsule Take 100 mg  by mouth 2 (two) times daily.    Historical Provider, MD  finasteride (PROSCAR) 5 MG tablet Take 5 mg by mouth daily. 04/12/14   Historical Provider, MD  furosemide (LASIX) 80 MG tablet TAKE 1 AND 1/2 TABLETS BY MOUTH EVERY MORNING AND TAKE ONE TABLET BY MOUTH EVERY EVENING. 12/03/15   Jonelle Sidle, MD  gabapentin (NEURONTIN) 600 MG tablet Take 2 tablets by mouth 3 (three) times daily. 11/01/14   Historical Provider, MD  lovastatin (MEVACOR) 40 MG tablet Take 40 mg by mouth at bedtime.    Historical Provider, MD  metFORMIN (GLUCOPHAGE) 500 MG tablet Take 500 mg by mouth 2 (two) times daily.     Historical Provider, MD  metoprolol succinate (TOPROL-XL) 25 MG 24 hr tablet Take 0.5 tablets (12.5 mg total) by mouth daily. 07/04/15   Henderson Cloud, MD  Multiple Vitamins-Minerals (CENTRUM PO) Take 1 tablet by mouth daily.      Historical Provider, MD  NITROSTAT 0.4 MG SL tablet Place 0.4 mg under the tongue every 5 (five) minutes as needed.     Historical Provider, MD  potassium chloride SA (K-DUR,KLOR-CON) 20 MEQ tablet TAKE TWO (2) TABLETS BY MOUTH TWICE DAILY. 12/05/15   Jonelle Sidle, MD  predniSONE (DELTASONE) 2.5 MG tablet Take 2 tablets (5 mg total) by mouth daily with breakfast. 12/04/15   Roslynn Amble, MD  SYMBICORT 160-4.5 MCG/ACT inhaler INHALE TWO PUFFS BY MOUTH TWICE DAILY 08/09/14   Storm Frisk, MD  tamsulosin (FLOMAX) 0.4 MG CAPS capsule Take 0.4 mg by mouth daily. 09/26/13   Historical Provider, MD  tiotropium (SPIRIVA) 18 MCG inhalation capsule Place 18 mcg into inhaler and inhale daily.    Historical Provider, MD  ULORIC 40 MG tablet Take 1 tablet by mouth daily. 04/30/15   Historical Provider, MD  warfarin (COUMADIN) 5 MG tablet TAKE 1 TABLET DAILY EXCEPT TAKE ONE-HALF (1/2) TABLET ON MONDAY, WEDNESDAY, AND FRIDAY OR AS DIRECTED BY COUMADIN CLINIC 02/27/16   Jonelle Sidle, MD    Family History Family History  Problem Relation Age of Onset  . Heart disease  Mother   . Stroke Mother   . Throat cancer Father   . Lung disease Neg Hx     Social History Social History  Substance Use Topics  . Smoking status: Former Smoker    Packs/day: 2.00    Years: 48.00    Types: Cigarettes    Start date: 02/14/1956    Quit date: 01/06/1998  . Smokeless tobacco: Never Used  . Alcohol use No     Comment: Remote history of use (beer)     Allergies   Clopidogrel bisulfate   Review of Systems Review of Systems  Constitutional: Positive for appetite change.  Respiratory: Positive for cough.   Musculoskeletal: Positive for gait  problem and myalgias.       Walks with walker  Skin: Positive for wound.       Abrasion on right shin occurred a few days ago  Allergic/Immunologic: Positive for immunocompromised state.       Diabetic  Neurological:       Confused per his wife     Physical Exam Updated Vital Signs Pulse (!) 54   Temp 98.9 F (37.2 C) (Rectal)   Resp 20   Ht  (1.753 m)   Wt 180 lb (81.6 kg)   SpO2 96%   BMI 26.58 kg/m   Physical Exam  Constitutional: He is oriented to person, place, and time.  Chronically ill-appearing  HENT:  Head: Normocephalic and atraumatic.  Mucous membranes dry  Eyes: Conjunctivae are normal. Pupils are equal, round, and reactive to light.  Neck: Neck supple. No tracheal deviation present. No thyromegaly present.  Cardiovascular: Regular rhythm.   No murmur heard. Irregularly irregular  Pulmonary/Chest: Effort normal and breath sounds normal.  Abdominal: Soft. Bowel sounds are normal. He exhibits no distension. There is no tenderness.  Genitourinary: Penis normal.  Genitourinary Comments: Normal male genitalia. Rectum normal tone brown stool. No gross blood  Musculoskeletal: Normal range of motion. He exhibits no edema or tenderness.  Neurological: He is alert and oriented to person, place, and time. No cranial nerve deficit. Coordination normal.  Moves all extremities well. Left lower extremity  with linear abrasion on shin approximate 5 cm in length. No swelling redness or tenderness  Skin: Skin is warm and dry. No rash noted.  Psychiatric: He has a normal mood and affect.  Nursing note and vitals reviewed.    ED Treatments / Results  Labs (all labs ordered are listed, but only abnormal results are displayed) Labs Reviewed  PROTIME-INR  COMPREHENSIVE METABOLIC PANEL  CBC WITH DIFFERENTIAL/PLATELET  TROPONIN I  URINALYSIS, ROUTINE W REFLEX MICROSCOPIC  POC OCCULT BLOOD, ED    EKG  EKG Interpretation  Date/Time:  Friday April 11 2016 14:04:31 EDT Ventricular Rate:  43 PR Interval:    QRS Duration: 107 QT Interval:  398 QTC Calculation: 337 R Axis:   86 Text Interpretation:  Atrial fibrillation Anteroseptal infarct, age indeterminate Since last tracing rate slower Confirmed by Ethelda Chick  MD, Breeonna Mone [phone]) on 04/11/2016 2:24:38 PM     Chest x-ray Viewed by me  Radiology No results found.  Procedures Procedures (including critical care time)  Medications Ordered in ED Medications  sodium chloride 0.9 % bolus 1,000 mL (not administered)   Results for orders placed or performed during the hospital encounter of 04/11/16  Protime-INR  Result Value Ref Range   Prothrombin Time 23.3 (H) 11.4 - 15.2 seconds   INR 2.04   Comprehensive metabolic panel  Result Value Ref Range   Sodium 137 135 - 145 mmol/L   Potassium 6.7 (HH) 3.5 - 5.1 mmol/L   Chloride 101 101 - 111 mmol/L   CO2 28 22 - 32 mmol/L   Glucose, Bld 95 65 - 99 mg/dL   BUN 38 (H) 6 - 20 mg/dL   Creatinine, Ser 1.91 (H) 0.61 - 1.24 mg/dL   Calcium 9.0 8.9 - 47.8 mg/dL   Total Protein 6.3 (L) 6.5 - 8.1 g/dL   Albumin 3.4 (L) 3.5 - 5.0 g/dL   AST 21 15 - 41 U/L   ALT 15 (L) 17 - 63 U/L   Alkaline Phosphatase 41 38 - 126 U/L   Total Bilirubin 0.4 0.3 -  1.2 mg/dL   GFR calc non Af Amer 19 (L) >60 mL/min   GFR calc Af Amer 22 (L) >60 mL/min   Anion gap 8 5 - 15  CBC with Differential/Platelet  Result  Value Ref Range   WBC 10.0 4.0 - 10.5 K/uL   RBC 4.51 4.22 - 5.81 MIL/uL   Hemoglobin 14.0 13.0 - 17.0 g/dL   HCT 16.1 09.6 - 04.5 %   MCV 94.9 78.0 - 100.0 fL   MCH 31.0 26.0 - 34.0 pg   MCHC 32.7 30.0 - 36.0 g/dL   RDW 40.9 81.1 - 91.4 %   Platelets 308 150 - 400 K/uL   Neutrophils Relative % 71 %   Neutro Abs 7.1 1.7 - 7.7 K/uL   Lymphocytes Relative 13 %   Lymphs Abs 1.3 0.7 - 4.0 K/uL   Monocytes Relative 12 %   Monocytes Absolute 1.2 (H) 0.1 - 1.0 K/uL   Eosinophils Relative 3 %   Eosinophils Absolute 0.3 0.0 - 0.7 K/uL   Basophils Relative 1 %   Basophils Absolute 0.1 0.0 - 0.1 K/uL  Troponin I  Result Value Ref Range   Troponin I <0.03 <0.03 ng/mL  Blood gas, venous  Result Value Ref Range   FIO2 21.00    pH, Ven 7.350 7.250 - 7.430   pCO2, Ven 49.9 44.0 - 60.0 mmHg   pO2, Ven BELOW REPORTABLE RANGE 32.0 - 45.0 mmHg   Bicarbonate 23.7 20.0 - 28.0 mmol/L   Acid-Base Excess 1.8 0.0 - 2.0 mmol/L   O2 Saturation 31.7 %   Patient temperature 98.6    Collection site VEIN    Drawn by DRAWN BY RN    Sample type VEIN   Digoxin level  Result Value Ref Range   Digoxin Level 0.5 (L) 0.8 - 2.0 ng/mL  POC occult blood, ED Provider will collect  Result Value Ref Range   Fecal Occult Bld NEGATIVE NEGATIVE   Dg Chest 2 View  Result Date: 04/11/2016 CLINICAL DATA:  Patient with shortness of breath, cough and weakness. EXAM: CHEST  2 VIEW COMPARISON:  Chest radiograph 07/02/2015 FINDINGS: Monitoring leads overlie the patient. Stable cardiomegaly. Calcification of the thoracic aorta. No consolidative pulmonary opacities. No pleural effusion or pneumothorax. Mid thoracic spine degenerative changes. IMPRESSION: Cardiomegaly.  No acute cardiopulmonary process. Electronically Signed   By: Annia Belt M.D.   On: 04/11/2016 14:49    Initial Impression / Assessment and Plan / ED Course  I have reviewed the triage vital signs and the nursing notes.  Pertinent labs & imaging results that  were available during my care of the patient were reviewed by me and considered in my medical decision making (see chart for details).     3:30 PM patient resting comfortably after treatment with intravenous hydration. Intravenous calcium gluconate, dextrose and insulin ordered to treat hyperkalemia, as well as intravenous hydration. I've consulted Dr.Le hospitalist service who evaluated patient in emergency department and arrange for admission Bradycardic rhythm likely secondary to hyperkalemia Final Clinical Impressions(s) / ED Diagnoses  Diagnosis #1 acute hyperkalemia #2 acute kidney injury #3 dehydration # generalized weakness Final diagnoses:  None   CRITICAL CARE Performed by: Doug Sou Total critical care time: 35 minutes Critical care time was exclusive of separately billable procedures and treating other patients. Critical care was necessary to treat or prevent imminent or life-threatening deterioration. Critical care was time spent personally by me on the following activities: development of treatment plan with patient and/or surrogate  as well as nursing, discussions with consultants, evaluation of patient's response to treatment, examination of patient, obtaining history from patient or surrogate, ordering and performing treatments and interventions, ordering and review of laboratory studies, ordering and review of radiographic studies, pulse oximetry and re-evaluation of patient's condition. New Prescriptions New Prescriptions   No medications on file     Doug Sou, MD 04/11/16 1542

## 2016-04-11 NOTE — Telephone Encounter (Signed)
Spoke with the pt's spouse  She states pt seems very confused and disoriented  He is not walking well and c/o leg pain  I advised her to take him to ED asap for eval and she verbalized understanding

## 2016-04-11 NOTE — ED Notes (Signed)
CRITICAL VALUE ALERT  Critical value received:  PH 7.35, CO2 49.9, P02 (not showing up)  Date of notification:  04/11/16  Time of notification:  1410  Critical value read back:Yes.    Nurse who received alert:  RMinter, RN  MD notified (1st page):  Dr. Ethelda Chick  Time of first page:  1410  MD notified (2nd page):  Time of second page:  Responding MD:  Dr. Ethelda Chick  Time MD responded:  [phone]

## 2016-04-12 ENCOUNTER — Inpatient Hospital Stay (HOSPITAL_COMMUNITY): Payer: Medicare Other

## 2016-04-12 LAB — PROTIME-INR
INR: 2.1
PROTHROMBIN TIME: 23.9 s — AB (ref 11.4–15.2)

## 2016-04-12 LAB — COMPREHENSIVE METABOLIC PANEL
ALT: 14 U/L — AB (ref 17–63)
AST: 16 U/L (ref 15–41)
Albumin: 3.1 g/dL — ABNORMAL LOW (ref 3.5–5.0)
Alkaline Phosphatase: 36 U/L — ABNORMAL LOW (ref 38–126)
Anion gap: 10 (ref 5–15)
BUN: 35 mg/dL — ABNORMAL HIGH (ref 6–20)
CALCIUM: 8.2 mg/dL — AB (ref 8.9–10.3)
CHLORIDE: 106 mmol/L (ref 101–111)
CO2: 25 mmol/L (ref 22–32)
CREATININE: 2.26 mg/dL — AB (ref 0.61–1.24)
GFR, EST AFRICAN AMERICAN: 30 mL/min — AB (ref >60)
GFR, EST NON AFRICAN AMERICAN: 26 mL/min — AB (ref >60)
Glucose, Bld: 133 mg/dL — ABNORMAL HIGH (ref 65–99)
Potassium: 4.5 mmol/L (ref 3.5–5.1)
Sodium: 141 mmol/L (ref 135–145)
Total Bilirubin: 0.4 mg/dL (ref 0.3–1.2)
Total Protein: 5.6 g/dL — ABNORMAL LOW (ref 6.5–8.1)

## 2016-04-12 LAB — GLUCOSE, CAPILLARY: GLUCOSE-CAPILLARY: 102 mg/dL — AB (ref 65–99)

## 2016-04-12 LAB — CBC
HCT: 39.1 % (ref 39.0–52.0)
Hemoglobin: 12.7 g/dL — ABNORMAL LOW (ref 13.0–17.0)
MCH: 30.5 pg (ref 26.0–34.0)
MCHC: 32.5 g/dL (ref 30.0–36.0)
MCV: 94 fL (ref 78.0–100.0)
PLATELETS: 281 10*3/uL (ref 150–400)
RBC: 4.16 MIL/uL — ABNORMAL LOW (ref 4.22–5.81)
RDW: 13.1 % (ref 11.5–15.5)
WBC: 6.8 10*3/uL (ref 4.0–10.5)

## 2016-04-12 LAB — CORTISOL: CORTISOL PLASMA: 27.1 ug/dL

## 2016-04-12 LAB — URIC ACID: URIC ACID, SERUM: 6.8 mg/dL (ref 4.4–7.6)

## 2016-04-12 MED ORDER — HALOPERIDOL LACTATE 5 MG/ML IJ SOLN
2.0000 mg | Freq: Once | INTRAMUSCULAR | Status: AC
  Administered 2016-04-12: 2 mg via INTRAVENOUS
  Filled 2016-04-12: qty 1

## 2016-04-12 MED ORDER — SODIUM CHLORIDE 0.9 % IV SOLN: INTRAVENOUS | Status: DC

## 2016-04-12 MED ORDER — PREDNISONE 20 MG PO TABS
30.0000 mg | ORAL_TABLET | Freq: Every day | ORAL | Status: DC
  Administered 2016-04-13: 30 mg via ORAL
  Filled 2016-04-12: qty 1

## 2016-04-12 MED ORDER — ORAL CARE MOUTH RINSE
15.0000 mL | Freq: Two times a day (BID) | OROMUCOSAL | Status: DC
  Administered 2016-04-12: 15 mL via OROMUCOSAL

## 2016-04-12 MED ORDER — HALOPERIDOL LACTATE 5 MG/ML IJ SOLN
1.0000 mg | Freq: Once | INTRAMUSCULAR | Status: AC
  Administered 2016-04-12: 1 mg via INTRAMUSCULAR
  Filled 2016-04-12: qty 1

## 2016-04-12 MED ORDER — METOPROLOL TARTRATE 25 MG PO TABS
25.0000 mg | ORAL_TABLET | Freq: Two times a day (BID) | ORAL | Status: DC
  Administered 2016-04-12 – 2016-04-13 (×2): 25 mg via ORAL
  Filled 2016-04-12 (×2): qty 1

## 2016-04-12 MED ORDER — TRAZODONE HCL 50 MG PO TABS
50.0000 mg | ORAL_TABLET | Freq: Once | ORAL | Status: DC
  Filled 2016-04-12: qty 1

## 2016-04-12 MED ORDER — INSULIN ASPART 100 UNIT/ML ~~LOC~~ SOLN: 0.0000 [IU] | Freq: Every day | SUBCUTANEOUS | Status: DC

## 2016-04-12 MED ORDER — DIGOXIN 125 MCG PO TABS
0.0625 mg | ORAL_TABLET | Freq: Every day | ORAL | Status: DC
  Administered 2016-04-13: 0.0625 mg via ORAL
  Filled 2016-04-12 (×2): qty 1

## 2016-04-12 MED ORDER — INSULIN ASPART 100 UNIT/ML ~~LOC~~ SOLN: 0.0000 [IU] | Freq: Three times a day (TID) | SUBCUTANEOUS | Status: DC

## 2016-04-12 MED ORDER — WARFARIN SODIUM 5 MG PO TABS
5.0000 mg | ORAL_TABLET | Freq: Once | ORAL | Status: AC
  Administered 2016-04-12: 5 mg via ORAL
  Filled 2016-04-12: qty 1

## 2016-04-12 NOTE — Progress Notes (Signed)
Pt confused and agitated. Trying to get out of bed unassisted. Staff present attempting to reorient patient. MD paged and orders given for Trazodone. Patient refusing to take medication. MD paged again and orders given for Haldol IM.

## 2016-04-12 NOTE — Progress Notes (Signed)
Patient woke up very confused. He stated he was at home. Patient repeatedly talking about an airplane and a picture. Patient anxious and restless, trying to get out of bed, taking O2 off, trying to tak gown off. Patient stated that he needed to urinate but stated he couldn't stand because he would fall and couldn't use urinal sitting down. Dr. Sharl Ma notified. Orders to bladder scan and straight cath patient. This RN attempted to bladder scan patient. Patient was combative during this. Bladder scan showed greater than 239 ml. Patient continues to be combative when trying to explain catheterization. Dr. Sharl Ma notified. [place] Nickel Charge RN aware. This RN straight cathed patient with difficulty. Other staff had to assist with cath. Cath was successful with 775 ml output. Haldol 2 mg given IV per orders. Patient still refusing to put O2 back on. Patient sitting on side of bed, refusing to lay down. Patient repeatedly stating that he shouldn't have said anything, and called this RN "hateful" even after explanation of trying to help patient. This RN at bedside for supervision at this time. Will continue to monitor.

## 2016-04-12 NOTE — Progress Notes (Signed)
Bladder scan showing .  Pt voided .

## 2016-04-12 NOTE — Progress Notes (Addendum)
PROGRESS NOTE                                                                                                                                                                                                             Patient Demographics:    [name], is a 76 y.o. male, DOB - 1940-03-11, ZOX:[id number]  Admit date - 04/11/2016   Admitting Physician Houston Siren, MD  Outpatient Primary MD for the patient is Juliette Alcide, MD  LOS - 1  Chief Complaint  Patient presents with  . Weakness       Brief Narrative  [name] is an 76 y.o. male lives at home, with multiple complex medical problems including CKD, HTN, HLD, afib on coumadin and Dig, along with BB, DM on Metformin and ACE I, Gout on colchicine, hx of PVD, COPD, pulmonary HTN, presented to the ER with weakness, nausea, decrease in appetite, and general failure.  He has a hard time ambulating and his joints and legs hurts, he was Admitted with dehydration, generalized weakness, failure to thrive, acute renal failure with possible adrenal crisis.   Subjective:    [name] today has, No headache, No chest pain, No abdominal pain - No Nausea, No new weakness tingling or numbness, No Cough - SOB.    Assessment  & Plan :     1.Adrenal insufficiency causing generalized weakness, dehydration, ARF and hyperkalemia. Patient is on steroids off and on for a long time, increased oral steroid dose, IV fluids, PT eval and monitor.  2. History of Chronic osteoarthritis with chronic joint pains and aches, worse in right knee. Acute on chronic problem also history of gout. Check uric acid, x-ray right knee, no focal deficits, PT eval, may require placement.  3. 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 Baseline creatinine around 1.5, likely due to #1 above, hydrate, hold nephrotoxins include ACE inhibitor on any diuretic. Check baseline that is scanned to rule out any retention. Repeat BMP in  the morning. I put her anemia has resolved.  4. Chronic atrial fibrillation [place] vasc 2 score of at least 4. Blood pressure and heart rate are stable, on Coumadin pharmacy monitoring INR, resume home dose beta blocker, resume lowest dose digoxin from tomorrow.  5. COPD. At baseline. Continue nebulizer treatments and oxygen as needed, no acute issues.  6.  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 Chronic diastolic dysfunction, EF 55-60% in 2013. Currently dehydrated, no acute cardiac issues, continue beta blocker but hold ACE inhibitor and diuretics due to dehydration. Chest pain-free, continue aspirin, beta blocker, statin, Coumadin combination. Postdischarge follow with Dr. Diona Browner.  7. Dyslipidemia. On statin continue.  8. DM type II. Currently on sliding scale.  No results found for: HGBA1C CBG (last 3)  No results for input(s): GLUCAP in the last 72 hours.    Diet : Diet Carb Modified Fluid consistency: Thin; Room service appropriate? Yes    Family Communication  :  None  Code Status :  Full  Disposition Plan  :  TBD  Consults  :  None  Procedures  :  None  DVT Prophylaxis  :  Coumadin  Lab Results  Component Value Date   INR 2.10 04/12/2016   INR 2.04 04/11/2016   INR 2.4 03/20/2016     Lab Results  Component Value Date   PLT 281 04/12/2016    Inpatient Medications  Scheduled Meds: . aspirin  81 mg Oral Daily  . [START ON 04/13/2016] digoxin  0.0625 mg Oral Daily  . docusate sodium  100 mg Oral BID  . finasteride  5 mg Oral Daily  . mouth rinse  15 mL Mouth Rinse BID  . metoprolol  25 mg Oral BID  . mometasone-formoterol  2 puff Inhalation BID  . pravastatin  20 mg Oral q1800  . [START ON 04/13/2016] predniSONE  30 mg Oral Q breakfast  . sodium chloride flush  3 mL Intravenous Q12H  . tiotropium  18 mcg Inhalation Daily  . warfarin  5 mg Oral Once  . Warfarin - Pharmacist Dosing Inpatient   Does not apply q1800   Continuous Infusions: . sodium chloride     PRN  Meds:.acetaminophen **OR** [DISCONTINUED] acetaminophen, albuterol  Antibiotics  :    Anti-infectives    None         Objective:   Vitals:   04/11/16 2210 04/12/16 0502 04/12/16 0558 04/12/16 0800  BP:   116/76   Pulse:   84   Resp:   16   Temp:   97.6 F (36.4 C)   TempSrc:   Oral   SpO2: 96% 96% 98% 90%  Weight:      Height:        Wt Readings from Last 3 Encounters:  04/11/16 78.7 kg (173 lb 9.6 oz)  02/19/16 83.3 kg (183 lb 9.6 oz)  11/20/15 85.7 kg (189 lb)     Intake/Output Summary (Last 24 hours) at 04/12/16 1032 Last data filed at 04/12/16 0700  Gross per 24 hour  Intake          1237.67 ml  Output             1325 ml  Net           -87.33 ml     Physical Exam  Awake Alert , No new F.N deficits, R leg mildly weaker than the left, Normal affect, hard of hearing [place].AT,PERRAL Supple Neck,No JVD, No cervical lymphadenopathy appriciated.  Symmetrical Chest wall movement, Good air movement bilaterally, CTAB RRR,No Gallops,Rubs or new Murmurs, No Parasternal Heave +ve B.Sounds, Abd Soft, No tenderness, No organomegaly appriciated, No rebound - guarding or rigidity. No Cyanosis, Clubbing or edema, No new Rash or bruise     Data Review:    CBC  Recent Labs Lab 04/11/16 1353 04/12/16 0547  WBC 10.0  6.8  HGB 14.0 12.7*  HCT 42.8 39.1  PLT 308 281  MCV 94.9 94.0  MCH 31.0 30.5  MCHC 32.7 32.5  RDW 13.2 13.1  LYMPHSABS 1.3  --   MONOABS 1.2*  --   EOSABS 0.3  --   BASOSABS 0.1  --     Chemistries   Recent Labs Lab 04/11/16 1353 04/11/16 2050 04/12/16 0547  NA 137 136 141  K 6.7* 5.0 4.5  CL 101 105 106  CO2 GLUCOSE 95 132* 133*  BUN 38* 35* 35*  CREATININE 2.93* 2.37* 2.26*  CALCIUM 9.0 8.3* 8.2*  AST 21  --  16  ALT 15*  --  14*  ALKPHOS 41  --  36*  BILITOT 0.4  --  0.4   ------------------------------------------------------------------------------------------------------------------ No results for input(s): CHOL,  HDL, LDLCALC, TRIG, CHOLHDL, LDLDIRECT in the last 72 hours.  No results found for: HGBA1C ------------------------------------------------------------------------------------------------------------------ No results for input(s): TSH, T4TOTAL, T3FREE, THYROIDAB in the last 72 hours.  Invalid input(s): FREET3 ------------------------------------------------------------------------------------------------------------------ No results for input(s): VITAMINB12, FOLATE, FERRITIN, TIBC, IRON, RETICCTPCT in the last 72 hours.  Coagulation profile  Recent Labs Lab 04/11/16 1353 04/12/16 0547  INR 2.04 2.10    No results for input(s): DDIMER in the last 72 hours.  Cardiac Enzymes  Recent Labs Lab 04/11/16 1353  TROPONINI <0.03   ------------------------------------------------------------------------------------------------------------------    Component Value Date/Time   BNP 54.0 07/01/2015 2338    Micro Results No results found for this or any previous visit (from the past 240 hour(s)).  Radiology Reports Dg Chest 2 View  Result Date: 04/11/2016 CLINICAL DATA:  Patient with shortness of breath, cough and weakness. EXAM: CHEST  2 VIEW COMPARISON:  Chest radiograph 07/02/2015 FINDINGS: Monitoring leads overlie the patient. Stable cardiomegaly. Calcification of the thoracic aorta. No consolidative pulmonary opacities. No pleural effusion or pneumothorax. Mid thoracic spine degenerative changes. IMPRESSION: Cardiomegaly.  No acute cardiopulmonary process. Electronically Signed   By: Annia Belt M.D.   On: 04/11/2016 14:49    Time Spent in minutes  30   Susa Raring M.D on 04/12/2016 at 10:32 AM  Between 7am to 7pm - Pager - [phone] ( page via [facility], text pages only, please mention full 10 digit call back number).  After 7pm go to [email or web address] - password [facility]  [facility] -  Office  [phone]

## 2016-04-12 NOTE — Progress Notes (Signed)
ANTICOAGULATION CONSULT NOTE - Initial Consult  Pharmacy Consult for Coumadin Indication: atrial fibrillation  Allergies  Allergen Reactions  . Clopidogrel Bisulfate Rash    Patient Measurements: Height:  (175.3 cm) Weight: 173 lb 9.6 oz (78.7 kg) IBW/kg (Calculated) : 70.7  Vital Signs: Temp: 97.6 F (36.4 C) (04/07 0558) Temp Source: Oral (04/07 0558) BP: 116/76 (04/07 0558) Pulse Rate: 84 (04/07 0558)  Labs:  Recent Labs  04/11/16 1353 04/11/16 2050 04/12/16 0547  HGB 14.0  --  12.7*  HCT 42.8  --  39.1  PLT 308  --  281  LABPROT 23.3*  --  23.9*  INR 2.04  --  2.10  CREATININE 2.93* 2.37* 2.26*  TROPONINI <0.03  --   --     Estimated Creatinine Clearance: 26.9 mL/min (A) (by C-G formula based on SCr of 2.26 mg/dL (H)).   Medical History: Past Medical History:  Diagnosis Date  . CAD (coronary artery disease)    DES to RCA 2004, moderate residual LAD diisease  . Chronic atrial fibrillation (HCC)   . Chronic diastolic CHF (congestive heart failure) (HCC)   . COPD with emphysema (HCC)    Followed by Pulmonary  . Drug allergy    Plavix causes severe rash  . Hyperlipemia   . Hypertension   . Obesity   . Peripheral arterial disease (HCC)   . Right ventricular dysfunction   . Secondary pulmonary hypertension    Moderate in setting of COPD  . Type 2 diabetes mellitus with diabetic neuropathy (HCC)     Medications:  Prescriptions Prior to Admission  Medication Sig Dispense Refill Last Dose  . albuterol (PROVENTIL) (2.5 MG/3ML) 0.083% nebulizer solution Take 3 mLs (2.5 mg total) by nebulization every 6 (six) hours as needed for wheezing. 75 mL 11 unknown  . albuterol (VENTOLIN HFA) 108 (90 Base) MCG/ACT inhaler Inhale 2 puffs into the lungs every 6 (six) hours as needed for wheezing. 1 Inhaler 6 unknown  . aspirin 81 MG tablet Take 81 mg by mouth daily.     04/11/2016 at Unknown time  . digoxin (DIGOX) 0.125 MG tablet Take 0.5 tablets (62.5 mcg total)  by mouth daily. 45 tablet 3 04/10/2016 at Unknown time  . docusate sodium (STOOL SOFTENER) 100 MG capsule Take 100 mg by mouth daily.    04/11/2016 at Unknown time  . finasteride (PROSCAR) 5 MG tablet Take 5 mg by mouth daily.  3 04/10/2016 at Unknown time  . furosemide (LASIX) 80 MG tablet TAKE 1 AND 1/2 TABLETS BY MOUTH EVERY MORNING AND TAKE ONE TABLET BY MOUTH EVERY EVENING. 225 tablet 3 04/11/2016 at Unknown time  . gabapentin (NEURONTIN) 600 MG tablet Take 2 tablets by mouth 3 (three) times daily.  6 04/11/2016 at Unknown time  . lisinopril (PRINIVIL,ZESTRIL) 5 MG tablet Take 5 mg by mouth every evening.   04/10/2016 at Unknown time  . lovastatin (MEVACOR) 40 MG tablet Take 40 mg by mouth at bedtime.   04/10/2016 at Unknown time  . metFORMIN (GLUCOPHAGE-XR) 500 MG 24 hr tablet Take 500 mg by mouth daily with breakfast.   04/11/2016 at Unknown time  . metoprolol (LOPRESSOR) 50 MG tablet Take 25 mg by mouth 2 (two) times daily.   04/11/2016 at 700a  . Multiple Vitamins-Minerals (CENTRUM PO) Take 1 tablet by mouth daily.     04/11/2016 at Unknown time  . NITROSTAT 0.4 MG SL tablet Place 0.4 mg under the tongue every 5 (five) minutes as needed.  unknown  . potassium chloride SA (K-DUR,KLOR-CON) 20 MEQ tablet TAKE TWO (2) TABLETS BY MOUTH TWICE DAILY. 180 tablet 3 04/11/2016 at Unknown time  . predniSONE (DELTASONE) 1 MG tablet Take 2 mg by mouth daily with breakfast.   04/11/2016 at Unknown time  . SYMBICORT 160-4.5 MCG/ACT inhaler INHALE TWO PUFFS BY MOUTH TWICE DAILY 10.2 g 2 04/11/2016 at Unknown time  . tamsulosin (FLOMAX) 0.4 MG CAPS capsule Take 0.4 mg by mouth 2 (two) times daily.    04/11/2016 at Unknown time  . tiotropium (SPIRIVA) 18 MCG inhalation capsule Place 18 mcg into inhaler and inhale daily.   04/11/2016 at Unknown time  . ULORIC 40 MG tablet Take 1 tablet by mouth daily.  6 04/10/2016 at Unknown time  . warfarin (COUMADIN) 5 MG tablet TAKE 1 TABLET DAILY EXCEPT TAKE ONE-HALF (1/2) TABLET ON MONDAY,  WEDNESDAY, AND FRIDAY OR AS DIRECTED BY COUMADIN CLINIC 90 tablet 4 04/10/2016 at 630p    Assessment: 76 y.o. male with multiple, complex medical problems including CKD, HTN, HLD, afib on coumadin and Dig, along with BB, DM on Metformin and ACE I, Gout on colchicine, hx of PVD, COPD, pulmonary HTN, presented to the ER with weakness, nausea, decrease in appetite, and general failure. Pharmacy asked to continue coumadin. Patient takes Coumadin 2.5mg  on MWF, and  on T,TH, Sat, Sun. INR therapeutic, continue home dose.  Goal of Therapy:  INR 2-3 Monitor platelets by anticoagulation protocol: Yes    Plan:  Coumadin  today x 1 PT-INR daily Monitor for S/S of bleeding  Elder Cyphers, BS Loura Back, BCPS Clinical Pharmacist Pager [phone] 04/12/2016,7:55 AM

## 2016-04-13 LAB — PROTIME-INR
INR: 2.2
Prothrombin Time: 24.8 seconds — ABNORMAL HIGH (ref 11.4–15.2)

## 2016-04-13 LAB — BASIC METABOLIC PANEL
ANION GAP: 8 (ref 5–15)
BUN: 32 mg/dL — AB (ref 6–20)
CHLORIDE: 110 mmol/L (ref 101–111)
CO2: 24 mmol/L (ref 22–32)
Calcium: 8.3 mg/dL — ABNORMAL LOW (ref 8.9–10.3)
Creatinine, Ser: 1.86 mg/dL — ABNORMAL HIGH (ref 0.61–1.24)
GFR calc Af Amer: 38 mL/min — ABNORMAL LOW (ref >60)
GFR, EST NON AFRICAN AMERICAN: 33 mL/min — AB (ref >60)
Glucose, Bld: 93 mg/dL (ref 65–99)
POTASSIUM: 4.1 mmol/L (ref 3.5–5.1)
SODIUM: 142 mmol/L (ref 135–145)

## 2016-04-13 LAB — GLUCOSE, CAPILLARY
GLUCOSE-CAPILLARY: 86 mg/dL (ref 65–99)
GLUCOSE-CAPILLARY: 85 mg/dL (ref 65–99)

## 2016-04-13 LAB — HEMOGLOBIN A1C
HEMOGLOBIN A1C: 5.7 % — AB (ref 4.8–5.6)
Mean Plasma Glucose: 117 mg/dL

## 2016-04-13 MED ORDER — FUROSEMIDE 40 MG PO TABS: 40.0000 mg | ORAL_TABLET | Freq: Every day | ORAL | 0 refills | Status: AC

## 2016-04-13 MED ORDER — HALOPERIDOL LACTATE 5 MG/ML IJ SOLN: 2.0000 mg | Freq: Four times a day (QID) | INTRAMUSCULAR | Status: DC | PRN

## 2016-04-13 MED ORDER — SODIUM CHLORIDE 0.9 % IV SOLN: INTRAVENOUS | Status: DC

## 2016-04-13 MED ORDER — WARFARIN SODIUM 5 MG PO TABS: 5.0000 mg | ORAL_TABLET | Freq: Once | ORAL | Status: DC

## 2016-04-13 MED ORDER — PREDNISONE 5 MG PO TABS: ORAL_TABLET | ORAL | 0 refills | Status: AC

## 2016-04-13 NOTE — Discharge Instructions (Signed)
Follow with Primary MD Juliette Alcide, MD in 3 days   Get CBC, CMP, 2 view Chest X ray checked  by Primary MD in 3 days ( we routinely change or add medications that can affect your baseline labs and fluid status, therefore we recommend that you get the mentioned basic workup next visit with your PCP, your PCP may decide not to get them or add new tests based on their clinical decision)  Activity: As tolerated with Full fall precautions use walker/cane & assistance as needed  Disposition Home    Diet:   Diet Carb Modified Heart Healthy   For Heart failure patients - Check your Weight same time everyday, if you gain over 2 pounds, or you develop in leg swelling, experience more shortness of breath or chest pain, call your Primary MD immediately. Follow Cardiac Low Salt Diet and 1.5 lit/day fluid restriction.  On your next visit with your primary care physician please Get Medicines reviewed and adjusted.  Please request your Prim.MD to go over all Hospital Tests and Procedure/Radiological results at the follow up, please get all Hospital records sent to your Prim MD by signing hospital release before you go home.  If you experience worsening of your admission symptoms, develop shortness of breath, life threatening emergency, suicidal or homicidal thoughts you must seek medical attention immediately by calling 911 or calling your MD immediately  if symptoms less severe.  You Must read complete instructions/literature along with all the possible adverse reactions/side effects for all the Medicines you take and that have been prescribed to you. Take any new Medicines after you have completely understood and accpet all the possible adverse reactions/side effects.   Do not drive, operate heavy machinery, perform activities at heights, swimming or participation in water activities or provide baby sitting services if your were admitted for syncope or siezures until you have seen by Primary MD or a  Neurologist and advised to do so again.  Do not drive when taking Pain medications.    Do not take more than prescribed Pain, Sleep and Anxiety Medications  Special Instructions: If you have smoked or chewed Tobacco  in the last 2 yrs please stop smoking, stop any regular Alcohol  and or any Recreational drug use.  Wear Seat belts while driving.   Please note  You were cared for by a hospitalist during your hospital stay. If you have any questions about your discharge medications or the care you received while you were in the hospital after you are discharged, you can call the unit and asked to speak with the hospitalist on call if the hospitalist that took care of you is not available. Once you are discharged, your primary care physician will handle any further medical issues. Please note that NO REFILLS for any discharge medications will be authorized once you are discharged, as it is imperative that you return to your primary care physician (or establish a relationship with a primary care physician if you do not have one) for your aftercare needs so that they can reassess your need for medications and monitor your lab values.

## 2016-04-13 NOTE — Progress Notes (Signed)
ANTICOAGULATION CONSULT NOTE - Initial Consult  Pharmacy Consult for Coumadin Indication: atrial fibrillation  Allergies  Allergen Reactions  . Clopidogrel Bisulfate Rash    Patient Measurements: Height:  (175.3 cm) Weight: 179 lb 7.3 oz (81.4 kg) IBW/kg (Calculated) : 70.7  Vital Signs: Temp: 98.5 F (36.9 C) (04/08 0615) Temp Source: Oral (04/08 0615) BP: 143/80 (04/08 0615) Pulse Rate: 82 (04/08 0615)  Labs:  Recent Labs  04/11/16 1353 04/11/16 2050 04/12/16 0547 04/13/16 0633  HGB 14.0  --  12.7*  --   HCT 42.8  --  39.1  --   PLT 308  --  281  --   LABPROT 23.3*  --  23.9* 24.8*  INR 2.04  --  2.10 2.20  CREATININE 2.93* 2.37* 2.26* 1.86*  TROPONINI <0.03  --   --   --     Estimated Creatinine Clearance: 32.7 mL/min (A) (by C-G formula based on SCr of 1.86 mg/dL (H)).   Medical History: Past Medical History:  Diagnosis Date  . CAD (coronary artery disease)    DES to RCA 2004, moderate residual LAD diisease  . Chronic atrial fibrillation (HCC)   . Chronic diastolic CHF (congestive heart failure) (HCC)   . COPD with emphysema (HCC)    Followed by Pulmonary  . Drug allergy    Plavix causes severe rash  . Hyperlipemia   . Hypertension   . Obesity   . Peripheral arterial disease (HCC)   . Right ventricular dysfunction   . Secondary pulmonary hypertension    Moderate in setting of COPD  . Type 2 diabetes mellitus with diabetic neuropathy (HCC)     Medications:  Prescriptions Prior to Admission  Medication Sig Dispense Refill Last Dose  . albuterol (PROVENTIL) (2.5 MG/3ML) 0.083% nebulizer solution Take 3 mLs (2.5 mg total) by nebulization every 6 (six) hours as needed for wheezing. 75 mL 11 unknown  . albuterol (VENTOLIN HFA) 108 (90 Base) MCG/ACT inhaler Inhale 2 puffs into the lungs every 6 (six) hours as needed for wheezing. 1 Inhaler 6 unknown  . aspirin 81 MG tablet Take 81 mg by mouth daily.     04/11/2016 at Unknown time  . digoxin (DIGOX)  0.125 MG tablet Take 0.5 tablets (62.5 mcg total) by mouth daily. 45 tablet 3 04/10/2016 at Unknown time  . docusate sodium (STOOL SOFTENER) 100 MG capsule Take 100 mg by mouth daily.    04/11/2016 at Unknown time  . finasteride (PROSCAR) 5 MG tablet Take 5 mg by mouth daily.  3 04/10/2016 at Unknown time  . furosemide (LASIX) 80 MG tablet TAKE 1 AND 1/2 TABLETS BY MOUTH EVERY MORNING AND TAKE ONE TABLET BY MOUTH EVERY EVENING. 225 tablet 3 04/11/2016 at Unknown time  . gabapentin (NEURONTIN) 600 MG tablet Take 2 tablets by mouth 3 (three) times daily.  6 04/11/2016 at Unknown time  . lisinopril (PRINIVIL,ZESTRIL) 5 MG tablet Take 5 mg by mouth every evening.   04/10/2016 at Unknown time  . lovastatin (MEVACOR) 40 MG tablet Take 40 mg by mouth at bedtime.   04/10/2016 at Unknown time  . metFORMIN (GLUCOPHAGE-XR) 500 MG 24 hr tablet Take 500 mg by mouth daily with breakfast.   04/11/2016 at Unknown time  . metoprolol (LOPRESSOR) 50 MG tablet Take 25 mg by mouth 2 (two) times daily.   04/11/2016 at 700a  . Multiple Vitamins-Minerals (CENTRUM PO) Take 1 tablet by mouth daily.     04/11/2016 at Unknown time  . NITROSTAT  0.4 MG SL tablet Place 0.4 mg under the tongue every 5 (five) minutes as needed.    unknown  . potassium chloride SA (K-DUR,KLOR-CON) 20 MEQ tablet TAKE TWO (2) TABLETS BY MOUTH TWICE DAILY. 180 tablet 3 04/11/2016 at Unknown time  . predniSONE (DELTASONE) 1 MG tablet Take 2 mg by mouth daily with breakfast.   04/11/2016 at Unknown time  . SYMBICORT 160-4.5 MCG/ACT inhaler INHALE TWO PUFFS BY MOUTH TWICE DAILY 10.2 g 2 04/11/2016 at Unknown time  . tamsulosin (FLOMAX) 0.4 MG CAPS capsule Take 0.4 mg by mouth 2 (two) times daily.    04/11/2016 at Unknown time  . tiotropium (SPIRIVA) 18 MCG inhalation capsule Place 18 mcg into inhaler and inhale daily.   04/11/2016 at Unknown time  . ULORIC 40 MG tablet Take 1 tablet by mouth daily.  6 04/10/2016 at Unknown time  . warfarin (COUMADIN) 5 MG tablet TAKE 1 TABLET DAILY  EXCEPT TAKE ONE-HALF (1/2) TABLET ON MONDAY, WEDNESDAY, AND FRIDAY OR AS DIRECTED BY COUMADIN CLINIC 90 tablet 4 04/10/2016 at 630p    Assessment: 76 y.o. male with multiple, complex medical problems including CKD, HTN, HLD, afib on coumadin and Dig, along with BB, DM on Metformin and ACE I, Gout on colchicine, hx of PVD, COPD, pulmonary HTN, presented to the ER with weakness, nausea, decrease in appetite, and general failure. Pharmacy asked to continue coumadin. Patient takes Coumadin 2.5mg  on MWF, and  on T,TH, Sat, Sun. INR therapeutic, continue home dose.  Goal of Therapy:  INR 2-3 Monitor platelets by anticoagulation protocol: Yes    Plan:  Coumadin  today x 1 PT-INR daily Monitor for S/S of bleeding  Elder Cyphers, BS Loura Back, BCPS Clinical Pharmacist Pager [phone] 04/13/2016,8:39 AM

## 2016-04-13 NOTE — Care Management Note (Signed)
Case Management Note  Patient Details  Name: [name] MRN: [id number] Date of Birth: 08/20/1940  Subjective/Objective:                  Weakness Action/Plan: Discharge planning Expected Discharge Date:  04/13/16               Expected Discharge Plan:  Home w Home Health Services  In-House Referral:     Discharge planning Services  CM Consult  Post Acute Care Choice:  Home Health Choice offered to:  Patient  DME Arranged:  N/A DME Agency:  NA  HH Arranged:  RN, PT, Social Work, Nurse's Aide HH Agency:  [facility]  Status of Service:  Completed, signed off  If discussed at [employer], dates discussed:    Additional Comments: CM received call from RN stating pt wishes to have [facility] render HHPT/RN/SW/aide.  Referral called to [facility] rep, Jermaine.  No other Cm needs were communicated. Yves Dill, RN 04/13/2016, 10:56 AM

## 2016-04-13 NOTE — Discharge Summary (Signed)
[name] ZOX:[id number] DOB: 06/12/1940 DOA: 04/11/2016  PCP: Juliette Alcide, MD  Admit date: 04/11/2016  Discharge date: 04/13/2016  Admitted From: Home   Disposition:  Home   Recommendations for Outpatient Follow-up:   Follow up with PCP in 1-2 weeks  PCP Please obtain BMP/CBC, 2 view CXR in 1week,  (see Discharge instructions)   PCP Please follow up on the following pending results: Monitor INR, BMP and diuretic dose closely, taper prednisone gradually over several weeks.   Home Health: PT, RN   Equipment/Devices: Has a walker  Consultations: None Discharge Condition: Fair   CODE STATUS: Full   Diet Recommendation: Diet Carb Modified & Heart Healthy    Chief Complaint  Patient presents with  . Weakness     Brief history of present illness from the day of admission and additional interim summary     Peter Carstens Hopkinsis an 76 y.o.malelives at home, with multiple complex medical problems including CKD, HTN, HLD, afib on coumadin and Dig, along with BB, DM on Metformin, Gout on colchicine, hx of PVD, COPD, pulmonary HTN, presented to the ER with weakness, nausea, decrease in appetite, and general failure. He has a hard time ambulating and his joints and legs hurts, he was Admitted with dehydration, generalized weakness, failure to thrive, acute renal failure with possible adrenal crisis.                                                                 Hospital Course    1. Adrenal insufficiency causing generalized weakness, dehydration, ARF and hyperkalemia. Patient has been on & off steroids for a long time, Much better with 30 mg of oral prednisone and IV fluids, symptoms have completely resolved and he wants to go home, wife bedside agrees with it, we'll place him on oral steroid moderate dose with PCP  to monitor taper gradually over several weeks. He does have some generalized weakness for which home PT and RN will be ordered, he will continue to use his home walker upon discharge.  2. History of Chronic osteoarthritis with chronic joint pains and aches, worse in right knee. Acute on chronic problem also history of gout. Stable uric acid and x-ray of the right knee, symptoms much better and at baseline this morning, discharged home with home PT and walker.  3. 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 Baseline creatinine around 1.5, likely due to #1 above, hydrated, will discontinue ACE inhibitor due to ARF with moderate to severe hyperkalemia upon admission, cut down home Lasix dose, request PCP to monitor his BMP and diuretic dose closely.  4. Chronic atrial fibrillation [place] vasc 2 score of at least 4. Blood pressure and heart rate are stable, on Coumadin pharmacy monitoring INR, resume home dose beta blocker, resume lowest dose digoxin from tomorrow.  5. COPD. At baseline. Continue nebulizer treatments and oxygen as needed, no acute issues.  6. 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 Chronic diastolic dysfunction, EF 55-60% in 2013. Currently dehydrated, no acute cardiac issues, continue beta blocker but hold ACE inhibitor and diuretics due to dehydration. Chest pain-free, continue aspirin, beta blocker, statin, Coumadin combination. Post discharge follow with Dr. Diona Browner.  7. Dyslipidemia. On statin continue.  8. DM type II. Continue home regimen.   Discharge diagnosis     Principal Problem:   AKI (acute kidney injury) (HCC) Active Problems:   Type 2 diabetes mellitus with diabetic neuropathy (HCC)   Volume depletion   COPD with emphysema (HCC)   Peripheral arterial disease (HCC)   Secondary pulmonary hypertension   Hyperkalemia   Current chronic use of systemic steroids   Gout attack    Discharge instructions    Discharge Instructions    Discharge instructions    Complete by:  As directed     Follow with Primary MD Juliette Alcide, MD in 3 days   Get CBC, CMP, 2 view Chest X ray checked  by Primary MD in 3 days ( we routinely change or add medications that can affect your baseline labs and fluid status, therefore we recommend that you get the mentioned basic workup next visit with your PCP, your PCP may decide not to get them or add new tests based on their clinical decision)  Activity: As tolerated with Full fall precautions use walker/cane & assistance as needed  Disposition Home    Diet:   Diet Carb Modified Heart Healthy   For Heart failure patients - Check your Weight same time everyday, if you gain over 2 pounds, or you develop in leg swelling, experience more shortness of breath or chest pain, call your Primary MD immediately. Follow Cardiac Low Salt Diet and 1.5 lit/day fluid restriction.  On your next visit with your primary care physician please Get Medicines reviewed and adjusted.  Please request your Prim.MD to go over all Hospital Tests and Procedure/Radiological results at the follow up, please get all Hospital records sent to your Prim MD by signing hospital release before you go home.  If you experience worsening of your admission symptoms, develop shortness of breath, life threatening emergency, suicidal or homicidal thoughts you must seek medical attention immediately by calling 911 or calling your MD immediately  if symptoms less severe.  You Must read complete instructions/literature along with all the possible adverse reactions/side effects for all the Medicines you take and that have been prescribed to you. Take any new Medicines after you have completely understood and accpet all the possible adverse reactions/side effects.   Do not drive, operate heavy machinery, perform activities at heights, swimming or participation in water activities or provide baby sitting services if your were admitted for syncope or siezures until you have seen by Primary MD or a  Neurologist and advised to do so again.  Do not drive when taking Pain medications.    Do not take more than prescribed Pain, Sleep and Anxiety Medications  Special Instructions: If you have smoked or chewed Tobacco  in the last 2 yrs please stop smoking, stop any regular Alcohol  and or any Recreational drug use.  Wear Seat belts while driving.   Please note  You were cared for by a hospitalist during your hospital stay. If you have any questions about your discharge medications or the care you received while you were in the hospital after you  are discharged, you can call the unit and asked to speak with the hospitalist on call if the hospitalist that took care of you is not available. Once you are discharged, your primary care physician will handle any further medical issues. Please note that NO REFILLS for any discharge medications will be authorized once you are discharged, as it is imperative that you return to your primary care physician (or establish a relationship with a primary care physician if you do not have one) for your aftercare needs so that they can reassess your need for medications and monitor your lab values.   Increase activity slowly    Complete by:  As directed       Discharge Medications   Allergies as of 04/13/2016      Reactions   Clopidogrel Bisulfate Rash      Medication List    STOP taking these medications   lisinopril 5 MG tablet Commonly known as:  PRINIVIL,ZESTRIL   predniSONE 1 MG tablet Commonly known as:  DELTASONE Replaced by:  predniSONE 5 MG tablet     TAKE these medications   albuterol (2.5 MG/3ML) 0.083% nebulizer solution Commonly known as:  PROVENTIL Take 3 mLs (2.5 mg total) by nebulization every 6 (six) hours as needed for wheezing.   albuterol 108 (90 Base) MCG/ACT inhaler Commonly known as:  VENTOLIN HFA Inhale 2 puffs into the lungs every 6 (six) hours as needed for wheezing.   aspirin 81 MG tablet Take 81 mg by mouth  daily.   CENTRUM PO Take 1 tablet by mouth daily.   digoxin 0.125 MG tablet Commonly known as:  DIGOX Take 0.5 tablets (62.5 mcg total) by mouth daily.   finasteride 5 MG tablet Commonly known as:  PROSCAR Take 5 mg by mouth daily.   furosemide 40 MG tablet Commonly known as:  LASIX Take 1 tablet (40 mg total) by mouth daily. TAKE 1 AND 1/2 TABLETS BY MOUTH EVERY MORNING AND TAKE ONE TABLET BY MOUTH EVERY EVENING. Start taking on:  04/16/2016 What changed:  medication strength  how much to take  how to take this  when to take this   gabapentin 600 MG tablet Commonly known as:  NEURONTIN Take 2 tablets by mouth 3 (three) times daily.   lovastatin 40 MG tablet Commonly known as:  MEVACOR Take 40 mg by mouth at bedtime.   metFORMIN 500 MG 24 hr tablet Commonly known as:  GLUCOPHAGE-XR Take 500 mg by mouth daily with breakfast.   metoprolol 50 MG tablet Commonly known as:  LOPRESSOR Take 25 mg by mouth 2 (two) times daily.   NITROSTAT 0.4 MG SL tablet Generic drug:  nitroGLYCERIN Place 0.4 mg under the tongue every 5 (five) minutes as needed.   potassium chloride SA 20 MEQ tablet Commonly known as:  K-DUR,KLOR-CON TAKE TWO (2) TABLETS BY MOUTH TWICE DAILY.   predniSONE 5 MG tablet Commonly known as:  DELTASONE Label  & dispense according to the schedule below. Take 3 pills daily for 3 days, then down to 2 pills daily for 3 days, then 1 pill daily until it is tapered down by her family physician Replaces:  predniSONE 1 MG tablet   STOOL SOFTENER 100 MG capsule Generic drug:  docusate sodium Take 100 mg by mouth daily.   SYMBICORT 160-4.5 MCG/ACT inhaler Generic drug:  budesonide-formoterol INHALE TWO PUFFS BY MOUTH TWICE DAILY   tamsulosin 0.4 MG Caps capsule Commonly known as:  FLOMAX Take 0.4 mg by mouth 2 (  two) times daily.   tiotropium 18 MCG inhalation capsule Commonly known as:  SPIRIVA Place 18 mcg into inhaler and inhale daily.   ULORIC 40 MG  tablet Generic drug:  febuxostat Take 1 tablet by mouth daily.   warfarin 5 MG tablet Commonly known as:  COUMADIN TAKE 1 TABLET DAILY EXCEPT TAKE ONE-HALF (1/2) TABLET ON MONDAY, WEDNESDAY, AND FRIDAY OR AS DIRECTED BY COUMADIN CLINIC       Follow-up Information    Juliette Alcide, MD. Schedule an appointment as soon as possible for a visit in 3 day(s).   Specialty:  Family Medicine Contact information: [address] [place] [address] [phone]           Major procedures and Radiology Reports - PLEASE review detailed and final reports thoroughly  -       Dg Chest 2 View  Result Date: 04/11/2016 CLINICAL DATA:  Patient with shortness of breath, cough and weakness. EXAM: CHEST  2 VIEW COMPARISON:  Chest radiograph 07/02/2015 FINDINGS: Monitoring leads overlie the patient. Stable cardiomegaly. Calcification of the thoracic aorta. No consolidative pulmonary opacities. No pleural effusion or pneumothorax. Mid thoracic spine degenerative changes. IMPRESSION: Cardiomegaly.  No acute cardiopulmonary process. Electronically Signed   By: Annia Belt M.D.   On: 04/11/2016 14:49   Dg Knee Complete 4 Views Right  Result Date: 04/12/2016 CLINICAL DATA:  Knee pain.  No trauma. EXAM: RIGHT KNEE - COMPLETE 4+ VIEW COMPARISON:  None. FINDINGS: Vascular calcifications are identified. Mild tricompartmental degenerative changes identified. No fractures or dislocations. No joint effusion. IMPRESSION: Mild tricompartmental degenerative changes. Electronically Signed   By: Gerome Sam III M.D   On: 04/12/2016 15:22    Micro Results     No results found for this or any previous visit (from the past 240 hour(s)).  Today   Subjective    [name] today has no headache,no chest abdominal pain,no new weakness tingling or numbness, feels much better wants to go home today.     Objective   Blood pressure (!) 143/80, pulse 82, temperature 98.5 F (36.9 C), temperature source Oral, resp.  rate 19, height  (1.753 m), weight 81.4 kg (179 lb 7.3 oz), SpO2 97 %.   Intake/Output Summary (Last 24 hours) at 04/13/16 0950 Last data filed at 04/13/16 0105  Gross per 24 hour  Intake              240 ml  Output              500 ml  Net             -260 ml    Exam Awake Alert, Oriented x 3, No new F.N deficits, Normal affect, hard of hearing [place].AT,PERRAL Supple Neck,No JVD, No cervical lymphadenopathy appriciated.  Symmetrical Chest wall movement, Good air movement bilaterally, CTAB RRR,No Gallops,Rubs or new Murmurs, No Parasternal Heave +ve B.Sounds, Abd Soft, Non tender, No organomegaly appriciated, No rebound -guarding or rigidity. No Cyanosis, Clubbing or edema, No new Rash or bruise   Data Review   CBC w Diff: Lab Results  Component Value Date   WBC 6.8 04/12/2016   HGB 12.7 (L) 04/12/2016   HCT 39.1 04/12/2016   PLT 281 04/12/2016   LYMPHOPCT 13 04/11/2016   MONOPCT 12 04/11/2016   EOSPCT 3 04/11/2016   BASOPCT 1 04/11/2016    CMP: Lab Results  Component Value Date   NA 142 04/13/2016   K 4.1 04/13/2016   CL  110 04/13/2016   CO2 24 04/13/2016   BUN 32 (H) 04/13/2016   CREATININE 1.86 (H) 04/13/2016   PROT 5.6 (L) 04/12/2016   PROT 6.4 10/25/2013   ALBUMIN 3.1 (L) 04/12/2016   BILITOT 0.4 04/12/2016   ALKPHOS 36 (L) 04/12/2016   AST 16 04/12/2016   ALT 14 (L) 04/12/2016  .   Total Time in preparing paper work, data evaluation and todays exam - 35 minutes  Susa Raring M.D on 04/13/2016 at 9:50 AM  [facility]   Office  [phone]

## 2016-05-01 ENCOUNTER — Telehealth: Payer: Self-pay | Admitting: *Deleted

## 2016-05-01 NOTE — Telephone Encounter (Signed)
Spoke with pt's wife.  Pt is to sick to come to office for INR check.  [facility] is seeing pt.  Will have them check INR on Monday 05/05/16.  Wife in agreement.  Order given to Laney Potash RN [facility].

## 2016-05-01 NOTE — Telephone Encounter (Signed)
Patient wanted to speak with Misty Stanley about rescheduling

## 2016-05-05 ENCOUNTER — Ambulatory Visit (INDEPENDENT_AMBULATORY_CARE_PROVIDER_SITE_OTHER): Payer: Medicare Other | Admitting: *Deleted

## 2016-05-05 DIAGNOSIS — I482 Chronic atrial fibrillation, unspecified: Secondary | ICD-10-CM

## 2016-05-05 DIAGNOSIS — Z5181 Encounter for therapeutic drug level monitoring: Secondary | ICD-10-CM

## 2016-05-05 LAB — POCT INR: INR: 2.5

## 2016-05-07 ENCOUNTER — Telehealth: Payer: Self-pay | Admitting: Pulmonary Disease

## 2016-05-07 NOTE — Telephone Encounter (Signed)
A certificate of medical necessity for oxygen was placed in the outgoing mail for [facility] in [place]. Nothing further is needed.

## 2016-05-13 ENCOUNTER — Ambulatory Visit (INDEPENDENT_AMBULATORY_CARE_PROVIDER_SITE_OTHER): Payer: Medicare Other | Admitting: *Deleted

## 2016-05-13 ENCOUNTER — Telehealth: Payer: Self-pay | Admitting: Cardiology

## 2016-05-13 DIAGNOSIS — Z5181 Encounter for therapeutic drug level monitoring: Secondary | ICD-10-CM

## 2016-05-13 DIAGNOSIS — I482 Chronic atrial fibrillation, unspecified: Secondary | ICD-10-CM

## 2016-05-13 LAB — POCT INR: INR: 3

## 2016-05-13 NOTE — Telephone Encounter (Signed)
[place] with [facility] called   INR   3.0 PT 35.9  # [phone].

## 2016-05-19 ENCOUNTER — Ambulatory Visit: Payer: Medicare Other | Admitting: Pulmonary Disease

## 2016-05-19 LAB — PROTIME-INR: INR: 4.2 — AB (ref 0.9–1.1)

## 2016-05-20 ENCOUNTER — Ambulatory Visit (INDEPENDENT_AMBULATORY_CARE_PROVIDER_SITE_OTHER): Payer: Medicare Other | Admitting: *Deleted

## 2016-05-20 DIAGNOSIS — I482 Chronic atrial fibrillation, unspecified: Secondary | ICD-10-CM

## 2016-05-20 DIAGNOSIS — Z5181 Encounter for therapeutic drug level monitoring: Secondary | ICD-10-CM

## 2016-05-21 ENCOUNTER — Other Ambulatory Visit: Payer: Self-pay | Admitting: Cardiology

## 2016-05-26 ENCOUNTER — Ambulatory Visit (INDEPENDENT_AMBULATORY_CARE_PROVIDER_SITE_OTHER): Payer: Medicare Other | Admitting: *Deleted

## 2016-05-26 DIAGNOSIS — Z5181 Encounter for therapeutic drug level monitoring: Secondary | ICD-10-CM

## 2016-05-26 DIAGNOSIS — I482 Chronic atrial fibrillation, unspecified: Secondary | ICD-10-CM

## 2016-05-26 LAB — POCT INR: INR: 1.5

## 2016-05-29 ENCOUNTER — Ambulatory Visit: Payer: Medicare Other | Admitting: Cardiology

## 2016-06-03 ENCOUNTER — Ambulatory Visit (INDEPENDENT_AMBULATORY_CARE_PROVIDER_SITE_OTHER): Payer: Medicare Other | Admitting: *Deleted

## 2016-06-03 DIAGNOSIS — Z5181 Encounter for therapeutic drug level monitoring: Secondary | ICD-10-CM

## 2016-06-03 DIAGNOSIS — I482 Chronic atrial fibrillation, unspecified: Secondary | ICD-10-CM

## 2016-06-03 LAB — POCT INR: INR: 1.7

## 2016-06-09 ENCOUNTER — Ambulatory Visit (INDEPENDENT_AMBULATORY_CARE_PROVIDER_SITE_OTHER): Payer: Medicare Other | Admitting: *Deleted

## 2016-06-09 DIAGNOSIS — Z5181 Encounter for therapeutic drug level monitoring: Secondary | ICD-10-CM

## 2016-06-09 DIAGNOSIS — I482 Chronic atrial fibrillation, unspecified: Secondary | ICD-10-CM

## 2016-06-09 LAB — PROTIME-INR: INR: 1.6 — AB (ref 0.9–1.1)

## 2016-06-16 ENCOUNTER — Ambulatory Visit (INDEPENDENT_AMBULATORY_CARE_PROVIDER_SITE_OTHER): Payer: Medicare Other | Admitting: *Deleted

## 2016-06-16 DIAGNOSIS — I482 Chronic atrial fibrillation, unspecified: Secondary | ICD-10-CM

## 2016-06-16 DIAGNOSIS — Z5181 Encounter for therapeutic drug level monitoring: Secondary | ICD-10-CM

## 2016-06-16 LAB — POCT INR: INR: 2.6

## 2016-06-24 ENCOUNTER — Ambulatory Visit (INDEPENDENT_AMBULATORY_CARE_PROVIDER_SITE_OTHER): Payer: Medicare Other | Admitting: *Deleted

## 2016-06-24 DIAGNOSIS — Z5181 Encounter for therapeutic drug level monitoring: Secondary | ICD-10-CM

## 2016-06-24 DIAGNOSIS — I482 Chronic atrial fibrillation, unspecified: Secondary | ICD-10-CM

## 2016-06-24 LAB — POCT INR: INR: 3.3

## 2016-07-01 ENCOUNTER — Ambulatory Visit (INDEPENDENT_AMBULATORY_CARE_PROVIDER_SITE_OTHER): Payer: Medicare Other | Admitting: *Deleted

## 2016-07-01 DIAGNOSIS — I482 Chronic atrial fibrillation, unspecified: Secondary | ICD-10-CM

## 2016-07-01 DIAGNOSIS — Z5181 Encounter for therapeutic drug level monitoring: Secondary | ICD-10-CM

## 2016-07-01 LAB — POCT INR: INR: 2

## 2016-07-15 ENCOUNTER — Ambulatory Visit (INDEPENDENT_AMBULATORY_CARE_PROVIDER_SITE_OTHER): Payer: Medicare Other | Admitting: *Deleted

## 2016-07-15 ENCOUNTER — Telehealth: Payer: Self-pay | Admitting: *Deleted

## 2016-07-15 DIAGNOSIS — I482 Chronic atrial fibrillation, unspecified: Secondary | ICD-10-CM

## 2016-07-15 DIAGNOSIS — Z5181 Encounter for therapeutic drug level monitoring: Secondary | ICD-10-CM

## 2016-07-15 LAB — POCT INR: INR: 2.4

## 2016-07-15 NOTE — Telephone Encounter (Signed)
[name] with [facility]  inr 2.4 [phone]

## 2016-07-15 NOTE — Telephone Encounter (Signed)
Done.  See coumadin note. 

## 2016-07-28 ENCOUNTER — Telehealth: Payer: Self-pay | Admitting: *Deleted

## 2016-07-28 ENCOUNTER — Ambulatory Visit (INDEPENDENT_AMBULATORY_CARE_PROVIDER_SITE_OTHER): Payer: Medicare Other | Admitting: *Deleted

## 2016-07-28 DIAGNOSIS — I482 Chronic atrial fibrillation, unspecified: Secondary | ICD-10-CM

## 2016-07-28 DIAGNOSIS — Z5181 Encounter for therapeutic drug level monitoring: Secondary | ICD-10-CM

## 2016-07-28 LAB — POCT INR: INR: 1.6

## 2016-07-28 NOTE — Telephone Encounter (Signed)
Done.  See coumadin note. 

## 2016-07-28 NOTE — Telephone Encounter (Signed)
Per Ard[facility] [phone]   PT 18.7  INR 1.6

## 2016-08-05 ENCOUNTER — Telehealth: Payer: Self-pay | Admitting: *Deleted

## 2016-08-05 ENCOUNTER — Ambulatory Visit (INDEPENDENT_AMBULATORY_CARE_PROVIDER_SITE_OTHER): Payer: Medicare Other | Admitting: *Deleted

## 2016-08-05 DIAGNOSIS — I482 Chronic atrial fibrillation, unspecified: Secondary | ICD-10-CM

## 2016-08-05 DIAGNOSIS — Z5181 Encounter for therapeutic drug level monitoring: Secondary | ICD-10-CM

## 2016-08-05 LAB — POCT INR: INR: 2.5

## 2016-08-05 NOTE — Telephone Encounter (Signed)
Please call Olegario MessierKathy with [facility]   [phone] PT 30.5 INR 2.5

## 2016-08-05 NOTE — Telephone Encounter (Signed)
Done.  See coumadin note. 

## 2016-08-19 ENCOUNTER — Ambulatory Visit (INDEPENDENT_AMBULATORY_CARE_PROVIDER_SITE_OTHER): Payer: Medicare Other | Admitting: *Deleted

## 2016-08-19 DIAGNOSIS — I482 Chronic atrial fibrillation, unspecified: Secondary | ICD-10-CM

## 2016-08-19 DIAGNOSIS — Z5181 Encounter for therapeutic drug level monitoring: Secondary | ICD-10-CM

## 2016-08-19 LAB — POCT INR: INR: 2.4

## 2016-09-03 ENCOUNTER — Ambulatory Visit (INDEPENDENT_AMBULATORY_CARE_PROVIDER_SITE_OTHER): Payer: Medicare Other | Admitting: Cardiology

## 2016-09-03 ENCOUNTER — Telehealth: Payer: Self-pay | Admitting: *Deleted

## 2016-09-03 DIAGNOSIS — I482 Chronic atrial fibrillation, unspecified: Secondary | ICD-10-CM

## 2016-09-03 DIAGNOSIS — Z5181 Encounter for therapeutic drug level monitoring: Secondary | ICD-10-CM

## 2016-09-03 LAB — POCT INR: INR: 1.8

## 2016-09-03 NOTE — Telephone Encounter (Signed)
[facility] Please refer to Anticoagulation Encounter for dosing instructions and appt.

## 2016-09-03 NOTE — Telephone Encounter (Signed)
INR 1.8 / please call w/ instructions / tg

## 2016-09-05 ENCOUNTER — Other Ambulatory Visit: Payer: Self-pay | Admitting: *Deleted

## 2016-09-05 MED ORDER — DIGOXIN 125 MCG PO TABS: 62.5000 ug | ORAL_TABLET | Freq: Every day | ORAL | 1 refills | Status: AC

## 2016-11-06 DEATH — deceased

## 2017-03-13 ENCOUNTER — Telehealth: Payer: Self-pay | Admitting: Pulmonary Disease

## 2017-11-24 IMAGING — DX DG KNEE COMPLETE 4+V*R*
4 series · 4 of 4 positions shown · non-contrast
Comparison: None.

CLINICAL DATA: Knee pain.  No trauma.

EXAM:
RIGHT KNEE - COMPLETE 4+ VIEW

[knee ap]
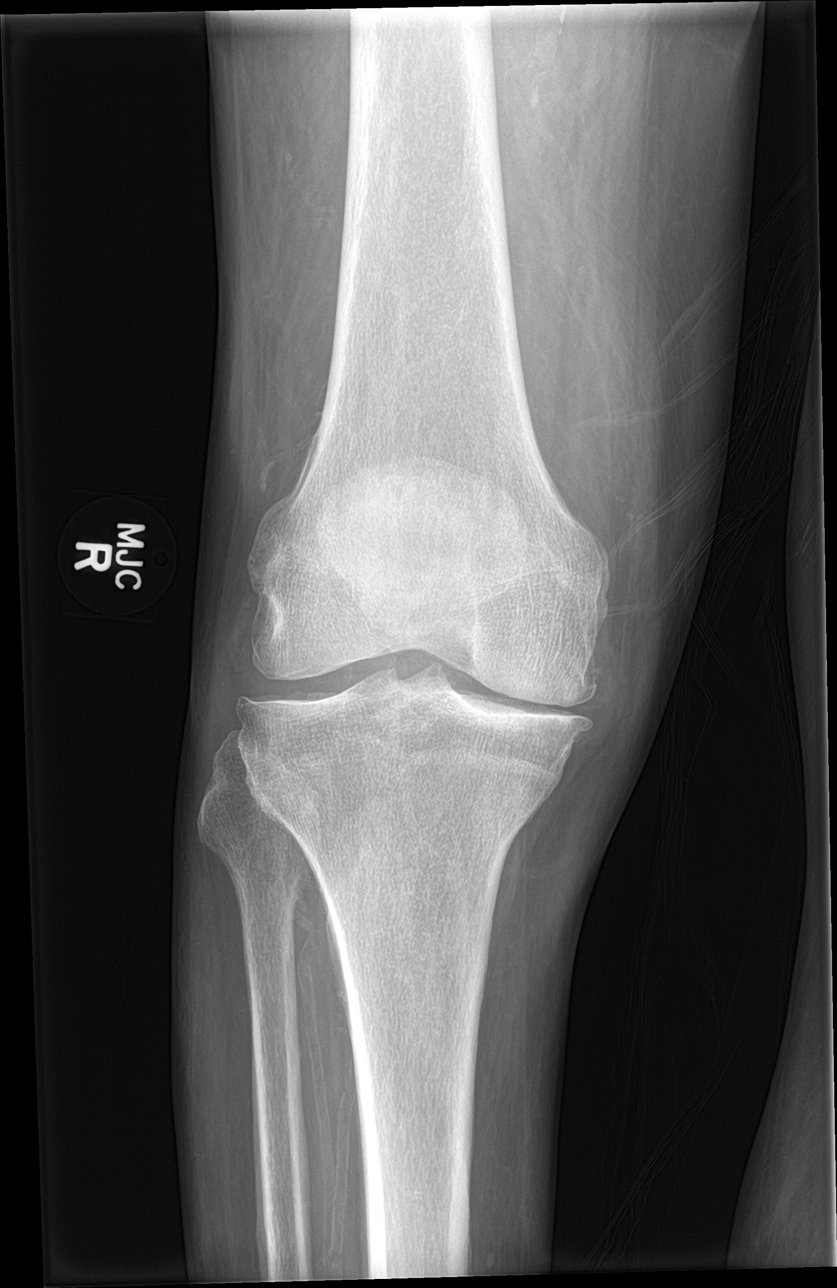

[tunnel]
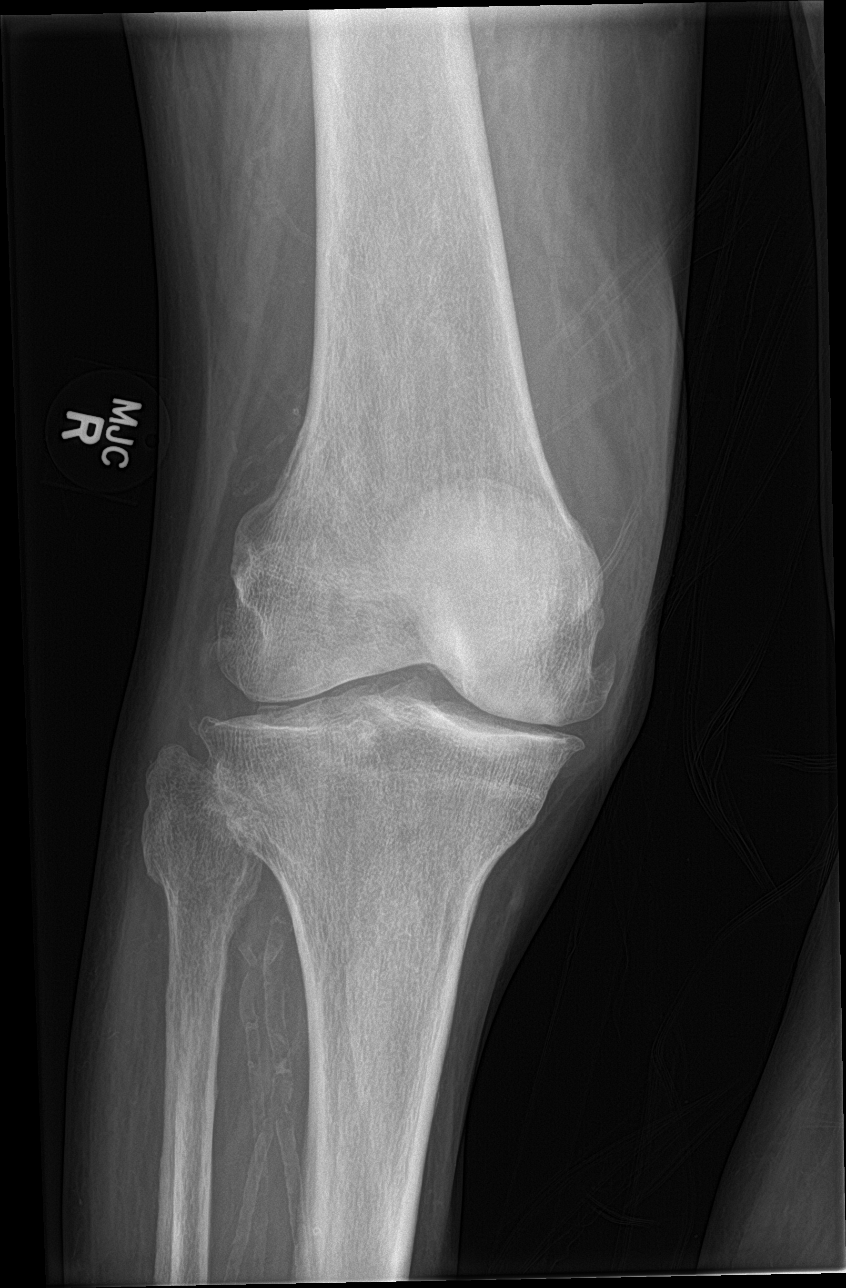

[knee lat]
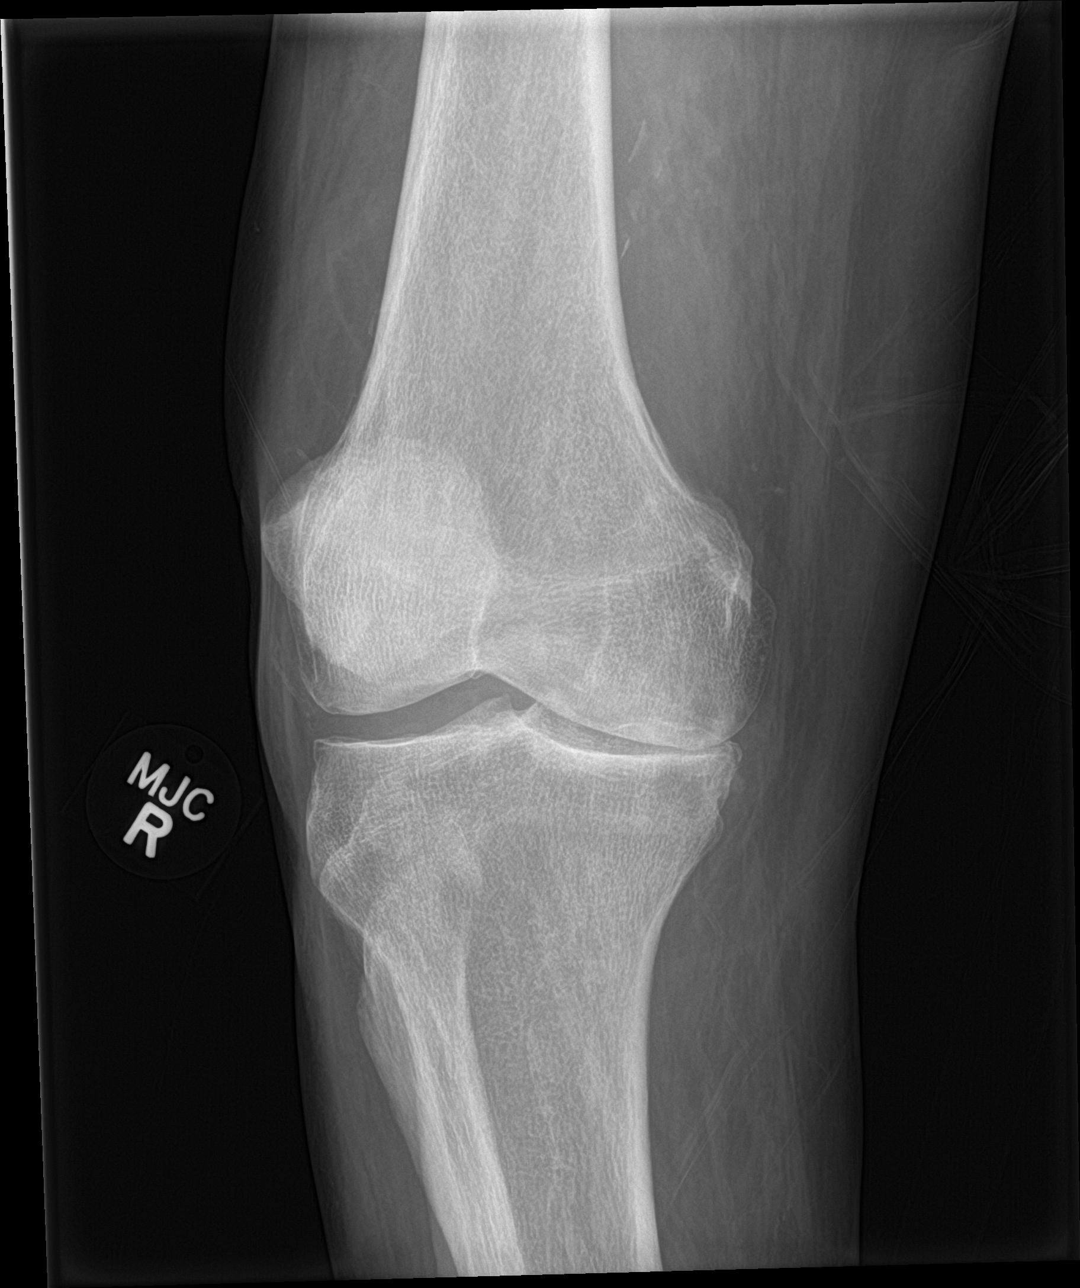

[knee sunrise]
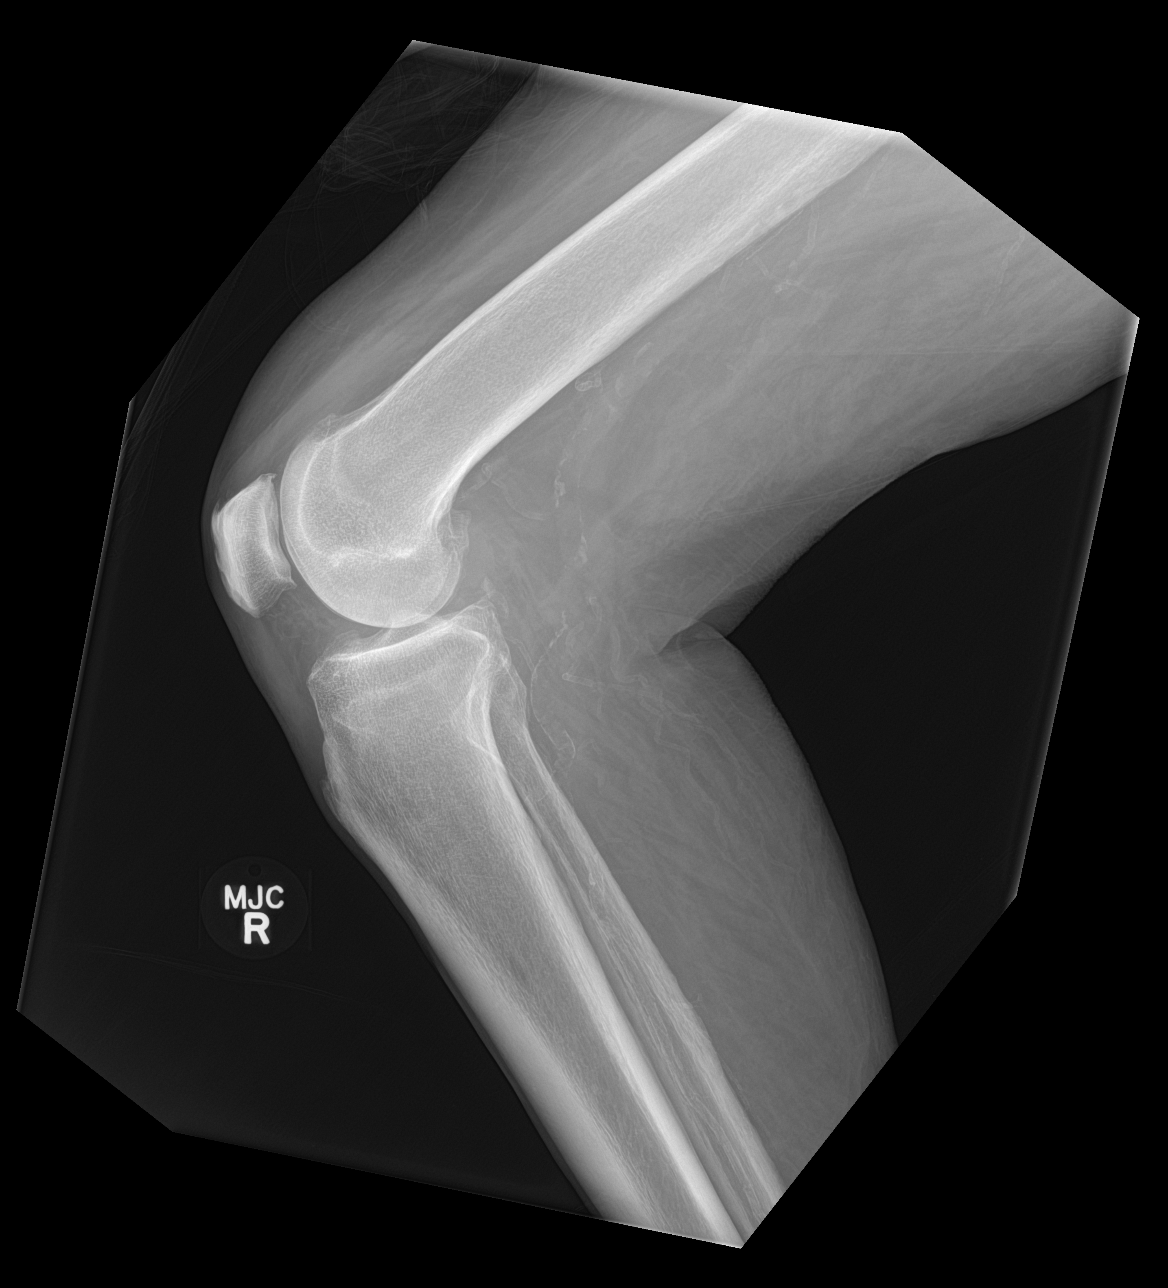

[4 of 4 positions shown; findings below may reference images not displayed]

FINDINGS: Vascular calcifications are identified. Mild tricompartmental
degenerative changes identified. No fractures or dislocations. No
joint effusion.
IMPRESSION: Mild tricompartmental degenerative changes.
